# Patient Record
Sex: Female | Born: 1960 | Race: Black or African American | Hispanic: No | Smoking: Never smoker
Health system: Southern US, Community
[De-identification: ages and names within clinical notes are randomized; demographics above are authoritative.]

## PROBLEM LIST (undated history)

## (undated) DIAGNOSIS — R51 Headache: Secondary | ICD-10-CM

## (undated) DIAGNOSIS — E785 Hyperlipidemia, unspecified: Secondary | ICD-10-CM

## (undated) DIAGNOSIS — I10 Essential (primary) hypertension: Secondary | ICD-10-CM

## (undated) DIAGNOSIS — K219 Gastro-esophageal reflux disease without esophagitis: Secondary | ICD-10-CM

## (undated) DIAGNOSIS — I639 Cerebral infarction, unspecified: Secondary | ICD-10-CM

## (undated) DIAGNOSIS — G4730 Sleep apnea, unspecified: Secondary | ICD-10-CM

## (undated) DIAGNOSIS — N189 Chronic kidney disease, unspecified: Secondary | ICD-10-CM

## (undated) DIAGNOSIS — R519 Headache, unspecified: Secondary | ICD-10-CM

## (undated) DIAGNOSIS — G629 Polyneuropathy, unspecified: Secondary | ICD-10-CM

## (undated) DIAGNOSIS — G935 Compression of brain: Secondary | ICD-10-CM

## (undated) HISTORY — PX: BRAIN SURGERY: SHX531

## (undated) HISTORY — DX: Polyneuropathy, unspecified: G62.9

## (undated) HISTORY — DX: Cerebral infarction, unspecified: I63.9

## (undated) HISTORY — DX: Hyperlipidemia, unspecified: E78.5

## (undated) HISTORY — DX: Sleep apnea, unspecified: G47.30

## (undated) HISTORY — PX: REDUCTION MAMMAPLASTY: SUR839

## (undated) HISTORY — DX: Compression of brain: G93.5

## (undated) HISTORY — DX: Chronic kidney disease, unspecified: N18.9

## (undated) HISTORY — DX: Essential (primary) hypertension: I10

---

## 1997-10-18 ENCOUNTER — Other Ambulatory Visit: Admission: RE | Admit: 1997-10-18 | Discharge: 1997-10-18 | Payer: Self-pay | Admitting: Family Medicine

## 1997-11-26 ENCOUNTER — Emergency Department (HOSPITAL_COMMUNITY): Admission: EM | Admit: 1997-11-26 | Discharge: 1997-11-26 | Payer: Self-pay | Admitting: Emergency Medicine

## 1997-12-27 ENCOUNTER — Other Ambulatory Visit: Admission: RE | Admit: 1997-12-27 | Discharge: 1997-12-27 | Payer: Self-pay | Admitting: Specialist

## 1998-01-06 ENCOUNTER — Emergency Department (HOSPITAL_COMMUNITY): Admission: EM | Admit: 1998-01-06 | Discharge: 1998-01-06 | Payer: Self-pay | Admitting: Emergency Medicine

## 1998-02-20 ENCOUNTER — Emergency Department (HOSPITAL_COMMUNITY): Admission: EM | Admit: 1998-02-20 | Discharge: 1998-02-20 | Payer: Self-pay | Admitting: Emergency Medicine

## 1998-03-05 ENCOUNTER — Emergency Department (HOSPITAL_COMMUNITY): Admission: EM | Admit: 1998-03-05 | Discharge: 1998-03-05 | Payer: Self-pay | Admitting: Emergency Medicine

## 1998-03-18 ENCOUNTER — Ambulatory Visit (HOSPITAL_BASED_OUTPATIENT_CLINIC_OR_DEPARTMENT_OTHER): Admission: RE | Admit: 1998-03-18 | Discharge: 1998-03-18 | Payer: Self-pay | Admitting: Urology

## 1998-03-19 ENCOUNTER — Ambulatory Visit (HOSPITAL_COMMUNITY): Admission: RE | Admit: 1998-03-19 | Discharge: 1998-03-19 | Payer: Self-pay | Admitting: Orthopaedic Surgery

## 1998-04-09 ENCOUNTER — Encounter: Admission: RE | Admit: 1998-04-09 | Discharge: 1998-07-08 | Payer: Self-pay | Admitting: Orthopaedic Surgery

## 1998-04-29 ENCOUNTER — Encounter: Payer: Self-pay | Admitting: Emergency Medicine

## 1998-04-29 ENCOUNTER — Emergency Department (HOSPITAL_COMMUNITY): Admission: EM | Admit: 1998-04-29 | Discharge: 1998-04-29 | Payer: Self-pay | Admitting: Emergency Medicine

## 1998-07-09 ENCOUNTER — Emergency Department (HOSPITAL_COMMUNITY): Admission: EM | Admit: 1998-07-09 | Discharge: 1998-07-09 | Payer: Self-pay | Admitting: Emergency Medicine

## 1998-07-13 ENCOUNTER — Emergency Department (HOSPITAL_COMMUNITY): Admission: EM | Admit: 1998-07-13 | Discharge: 1998-07-13 | Payer: Self-pay | Admitting: Emergency Medicine

## 1998-07-16 ENCOUNTER — Encounter: Admission: RE | Admit: 1998-07-16 | Discharge: 1998-10-14 | Payer: Self-pay | Admitting: Orthopaedic Surgery

## 1998-07-17 ENCOUNTER — Ambulatory Visit (HOSPITAL_COMMUNITY): Admission: RE | Admit: 1998-07-17 | Discharge: 1998-07-17 | Payer: Self-pay | Admitting: Family Medicine

## 1998-07-17 ENCOUNTER — Encounter: Payer: Self-pay | Admitting: Family Medicine

## 1998-09-10 ENCOUNTER — Other Ambulatory Visit: Admission: RE | Admit: 1998-09-10 | Discharge: 1998-09-10 | Payer: Self-pay | Admitting: Specialist

## 1998-09-18 ENCOUNTER — Emergency Department (HOSPITAL_COMMUNITY): Admission: EM | Admit: 1998-09-18 | Discharge: 1998-09-18 | Payer: Self-pay | Admitting: *Deleted

## 1998-10-24 ENCOUNTER — Encounter: Payer: Self-pay | Admitting: *Deleted

## 1998-10-24 ENCOUNTER — Emergency Department (HOSPITAL_COMMUNITY): Admission: EM | Admit: 1998-10-24 | Discharge: 1998-10-24 | Payer: Self-pay | Admitting: *Deleted

## 1998-10-27 ENCOUNTER — Emergency Department (HOSPITAL_COMMUNITY): Admission: EM | Admit: 1998-10-27 | Discharge: 1998-10-27 | Payer: Self-pay | Admitting: Emergency Medicine

## 1998-11-14 ENCOUNTER — Encounter: Admission: RE | Admit: 1998-11-14 | Discharge: 1999-02-12 | Payer: Self-pay | Admitting: Family Medicine

## 1998-11-20 ENCOUNTER — Encounter: Admission: RE | Admit: 1998-11-20 | Discharge: 1999-02-18 | Payer: Self-pay

## 1999-03-07 HISTORY — PX: CRANIECTOMY SUBOCCIPITAL W/ CERVICAL LAMINECTOMY / CHIARI: SUR327

## 1999-05-06 ENCOUNTER — Encounter: Payer: Self-pay | Admitting: Emergency Medicine

## 1999-05-06 ENCOUNTER — Emergency Department (HOSPITAL_COMMUNITY): Admission: EM | Admit: 1999-05-06 | Discharge: 1999-05-06 | Payer: Self-pay

## 1999-06-11 ENCOUNTER — Encounter: Payer: Self-pay | Admitting: Cardiovascular Disease

## 1999-06-11 ENCOUNTER — Ambulatory Visit (HOSPITAL_COMMUNITY): Admission: RE | Admit: 1999-06-11 | Discharge: 1999-06-11 | Payer: Self-pay | Admitting: Cardiovascular Disease

## 1999-06-27 ENCOUNTER — Encounter: Payer: Self-pay | Admitting: Cardiovascular Disease

## 1999-06-27 ENCOUNTER — Ambulatory Visit (HOSPITAL_COMMUNITY): Admission: RE | Admit: 1999-06-27 | Discharge: 1999-06-27 | Payer: Self-pay | Admitting: Cardiovascular Disease

## 1999-07-22 ENCOUNTER — Ambulatory Visit (HOSPITAL_COMMUNITY): Admission: RE | Admit: 1999-07-22 | Discharge: 1999-07-22 | Payer: Self-pay | Admitting: Cardiovascular Disease

## 1999-09-04 ENCOUNTER — Other Ambulatory Visit: Admission: RE | Admit: 1999-09-04 | Discharge: 1999-09-04 | Payer: Self-pay | Admitting: Urology

## 1999-09-17 ENCOUNTER — Encounter: Payer: Self-pay | Admitting: Urology

## 1999-09-17 ENCOUNTER — Encounter: Admission: RE | Admit: 1999-09-17 | Discharge: 1999-09-17 | Payer: Self-pay | Admitting: Urology

## 1999-10-02 ENCOUNTER — Encounter (INDEPENDENT_AMBULATORY_CARE_PROVIDER_SITE_OTHER): Payer: Self-pay | Admitting: *Deleted

## 1999-10-02 ENCOUNTER — Ambulatory Visit (HOSPITAL_BASED_OUTPATIENT_CLINIC_OR_DEPARTMENT_OTHER): Admission: RE | Admit: 1999-10-02 | Discharge: 1999-10-02 | Payer: Self-pay | Admitting: Urology

## 1999-10-13 ENCOUNTER — Other Ambulatory Visit: Admission: RE | Admit: 1999-10-13 | Discharge: 1999-10-13 | Payer: Self-pay | Admitting: Family Medicine

## 1999-10-16 ENCOUNTER — Encounter: Payer: Self-pay | Admitting: Family Medicine

## 1999-10-16 ENCOUNTER — Ambulatory Visit (HOSPITAL_COMMUNITY): Admission: RE | Admit: 1999-10-16 | Discharge: 1999-10-16 | Payer: Self-pay | Admitting: Family Medicine

## 1999-10-24 ENCOUNTER — Ambulatory Visit (HOSPITAL_COMMUNITY): Admission: RE | Admit: 1999-10-24 | Discharge: 1999-10-24 | Payer: Self-pay | Admitting: Family Medicine

## 1999-12-27 ENCOUNTER — Encounter: Payer: Self-pay | Admitting: Emergency Medicine

## 1999-12-27 ENCOUNTER — Emergency Department (HOSPITAL_COMMUNITY): Admission: EM | Admit: 1999-12-27 | Discharge: 1999-12-27 | Payer: Self-pay | Admitting: Emergency Medicine

## 2000-01-15 ENCOUNTER — Ambulatory Visit (HOSPITAL_COMMUNITY): Admission: RE | Admit: 2000-01-15 | Discharge: 2000-01-15 | Payer: Self-pay | Admitting: *Deleted

## 2000-01-22 ENCOUNTER — Ambulatory Visit (HOSPITAL_COMMUNITY): Admission: RE | Admit: 2000-01-22 | Discharge: 2000-01-22 | Payer: Self-pay | Admitting: *Deleted

## 2000-01-24 ENCOUNTER — Emergency Department (HOSPITAL_COMMUNITY): Admission: EM | Admit: 2000-01-24 | Discharge: 2000-01-24 | Payer: Self-pay | Admitting: *Deleted

## 2000-01-24 ENCOUNTER — Encounter: Payer: Self-pay | Admitting: *Deleted

## 2000-01-30 ENCOUNTER — Ambulatory Visit (HOSPITAL_COMMUNITY): Admission: RE | Admit: 2000-01-30 | Discharge: 2000-01-30 | Payer: Self-pay | Admitting: *Deleted

## 2000-01-30 ENCOUNTER — Encounter: Payer: Self-pay | Admitting: *Deleted

## 2000-04-20 ENCOUNTER — Emergency Department (HOSPITAL_COMMUNITY): Admission: EM | Admit: 2000-04-20 | Discharge: 2000-04-20 | Payer: Self-pay | Admitting: Emergency Medicine

## 2000-04-20 ENCOUNTER — Encounter: Payer: Self-pay | Admitting: Emergency Medicine

## 2000-05-18 ENCOUNTER — Encounter: Payer: Self-pay | Admitting: Specialist

## 2000-05-18 ENCOUNTER — Encounter: Admission: RE | Admit: 2000-05-18 | Discharge: 2000-05-18 | Payer: Self-pay | Admitting: Specialist

## 2000-06-12 ENCOUNTER — Encounter: Payer: Self-pay | Admitting: Emergency Medicine

## 2000-06-12 ENCOUNTER — Emergency Department (HOSPITAL_COMMUNITY): Admission: EM | Admit: 2000-06-12 | Discharge: 2000-06-13 | Payer: Self-pay | Admitting: Emergency Medicine

## 2000-07-25 ENCOUNTER — Emergency Department (HOSPITAL_COMMUNITY): Admission: EM | Admit: 2000-07-25 | Discharge: 2000-07-25 | Payer: Self-pay | Admitting: Emergency Medicine

## 2000-07-25 ENCOUNTER — Encounter: Payer: Self-pay | Admitting: Emergency Medicine

## 2000-08-01 ENCOUNTER — Encounter: Payer: Self-pay | Admitting: Family Medicine

## 2000-08-01 ENCOUNTER — Ambulatory Visit (HOSPITAL_COMMUNITY): Admission: RE | Admit: 2000-08-01 | Discharge: 2000-08-01 | Payer: Self-pay | Admitting: Family Medicine

## 2000-10-05 ENCOUNTER — Inpatient Hospital Stay (HOSPITAL_COMMUNITY): Admission: EM | Admit: 2000-10-05 | Discharge: 2000-10-07 | Payer: Self-pay | Admitting: Emergency Medicine

## 2000-10-05 ENCOUNTER — Encounter: Payer: Self-pay | Admitting: Emergency Medicine

## 2000-10-06 ENCOUNTER — Encounter: Payer: Self-pay | Admitting: Cardiovascular Disease

## 2000-10-07 ENCOUNTER — Encounter: Payer: Self-pay | Admitting: Cardiovascular Disease

## 2000-10-21 ENCOUNTER — Emergency Department (HOSPITAL_COMMUNITY): Admission: EM | Admit: 2000-10-21 | Discharge: 2000-10-21 | Payer: Self-pay | Admitting: Emergency Medicine

## 2000-10-29 ENCOUNTER — Ambulatory Visit (HOSPITAL_COMMUNITY): Admission: RE | Admit: 2000-10-29 | Discharge: 2000-10-29 | Payer: Self-pay | Admitting: Cardiovascular Disease

## 2000-12-05 ENCOUNTER — Encounter: Payer: Self-pay | Admitting: Family Medicine

## 2000-12-05 ENCOUNTER — Ambulatory Visit (HOSPITAL_COMMUNITY): Admission: RE | Admit: 2000-12-05 | Discharge: 2000-12-05 | Payer: Self-pay | Admitting: Family Medicine

## 2000-12-18 ENCOUNTER — Emergency Department (HOSPITAL_COMMUNITY): Admission: EM | Admit: 2000-12-18 | Discharge: 2000-12-18 | Payer: Self-pay | Admitting: Emergency Medicine

## 2000-12-18 ENCOUNTER — Encounter: Payer: Self-pay | Admitting: Emergency Medicine

## 2001-01-28 ENCOUNTER — Ambulatory Visit (HOSPITAL_COMMUNITY): Admission: RE | Admit: 2001-01-28 | Discharge: 2001-01-28 | Payer: Self-pay | Admitting: Neurosurgery

## 2001-01-28 ENCOUNTER — Encounter: Payer: Self-pay | Admitting: Neurosurgery

## 2001-03-03 ENCOUNTER — Ambulatory Visit (HOSPITAL_COMMUNITY): Admission: RE | Admit: 2001-03-03 | Discharge: 2001-03-03 | Payer: Self-pay | Admitting: Neurosurgery

## 2001-03-03 ENCOUNTER — Encounter: Payer: Self-pay | Admitting: Neurosurgery

## 2001-04-13 ENCOUNTER — Inpatient Hospital Stay (HOSPITAL_COMMUNITY): Admission: RE | Admit: 2001-04-13 | Discharge: 2001-04-20 | Payer: Self-pay | Admitting: Neurosurgery

## 2001-04-24 ENCOUNTER — Inpatient Hospital Stay (HOSPITAL_COMMUNITY): Admission: EM | Admit: 2001-04-24 | Discharge: 2001-04-26 | Payer: Self-pay | Admitting: *Deleted

## 2001-04-24 ENCOUNTER — Encounter: Payer: Self-pay | Admitting: Neurosurgery

## 2001-05-27 ENCOUNTER — Encounter: Payer: Self-pay | Admitting: Neurosurgery

## 2001-05-28 ENCOUNTER — Inpatient Hospital Stay (HOSPITAL_COMMUNITY): Admission: RE | Admit: 2001-05-28 | Discharge: 2001-06-01 | Payer: Self-pay | Admitting: Neurosurgery

## 2001-06-06 ENCOUNTER — Inpatient Hospital Stay (HOSPITAL_COMMUNITY): Admission: AD | Admit: 2001-06-06 | Discharge: 2001-06-08 | Payer: Self-pay | Admitting: Neurosurgery

## 2001-06-13 ENCOUNTER — Emergency Department (HOSPITAL_COMMUNITY): Admission: EM | Admit: 2001-06-13 | Discharge: 2001-06-13 | Payer: Self-pay | Admitting: Emergency Medicine

## 2001-06-14 ENCOUNTER — Encounter: Payer: Self-pay | Admitting: Emergency Medicine

## 2001-06-24 ENCOUNTER — Emergency Department (HOSPITAL_COMMUNITY): Admission: EM | Admit: 2001-06-24 | Discharge: 2001-06-24 | Payer: Self-pay | Admitting: Emergency Medicine

## 2001-06-24 ENCOUNTER — Encounter: Payer: Self-pay | Admitting: Emergency Medicine

## 2001-09-03 ENCOUNTER — Emergency Department (HOSPITAL_COMMUNITY): Admission: EM | Admit: 2001-09-03 | Discharge: 2001-09-03 | Payer: Self-pay | Admitting: Emergency Medicine

## 2001-10-10 ENCOUNTER — Emergency Department (HOSPITAL_COMMUNITY): Admission: EM | Admit: 2001-10-10 | Discharge: 2001-10-10 | Payer: Self-pay | Admitting: Emergency Medicine

## 2001-10-19 ENCOUNTER — Encounter: Admission: RE | Admit: 2001-10-19 | Discharge: 2001-11-07 | Payer: Self-pay | Admitting: Neurosurgery

## 2001-10-28 ENCOUNTER — Emergency Department (HOSPITAL_COMMUNITY): Admission: EM | Admit: 2001-10-28 | Discharge: 2001-10-28 | Payer: Self-pay | Admitting: Emergency Medicine

## 2001-10-28 ENCOUNTER — Encounter: Payer: Self-pay | Admitting: Emergency Medicine

## 2002-01-06 ENCOUNTER — Emergency Department (HOSPITAL_COMMUNITY): Admission: EM | Admit: 2002-01-06 | Discharge: 2002-01-06 | Payer: Self-pay | Admitting: Emergency Medicine

## 2002-02-22 ENCOUNTER — Emergency Department (HOSPITAL_COMMUNITY): Admission: EM | Admit: 2002-02-22 | Discharge: 2002-02-23 | Payer: Self-pay | Admitting: Emergency Medicine

## 2002-02-22 ENCOUNTER — Encounter: Payer: Self-pay | Admitting: Emergency Medicine

## 2002-02-23 ENCOUNTER — Encounter: Payer: Self-pay | Admitting: Emergency Medicine

## 2002-02-24 ENCOUNTER — Encounter: Payer: Self-pay | Admitting: Emergency Medicine

## 2002-02-24 ENCOUNTER — Emergency Department (HOSPITAL_COMMUNITY): Admission: EM | Admit: 2002-02-24 | Discharge: 2002-02-24 | Payer: Self-pay | Admitting: Emergency Medicine

## 2002-05-06 ENCOUNTER — Emergency Department (HOSPITAL_COMMUNITY): Admission: EM | Admit: 2002-05-06 | Discharge: 2002-05-07 | Payer: Self-pay | Admitting: Emergency Medicine

## 2002-05-07 ENCOUNTER — Encounter: Payer: Self-pay | Admitting: Emergency Medicine

## 2002-07-23 ENCOUNTER — Emergency Department (HOSPITAL_COMMUNITY): Admission: EM | Admit: 2002-07-23 | Discharge: 2002-07-23 | Payer: Self-pay | Admitting: Emergency Medicine

## 2002-07-25 ENCOUNTER — Encounter: Payer: Self-pay | Admitting: Emergency Medicine

## 2002-07-25 ENCOUNTER — Emergency Department (HOSPITAL_COMMUNITY): Admission: EM | Admit: 2002-07-25 | Discharge: 2002-07-26 | Payer: Self-pay | Admitting: Emergency Medicine

## 2002-08-24 ENCOUNTER — Emergency Department (HOSPITAL_COMMUNITY): Admission: EM | Admit: 2002-08-24 | Discharge: 2002-08-24 | Payer: Self-pay | Admitting: Emergency Medicine

## 2002-09-17 ENCOUNTER — Emergency Department (HOSPITAL_COMMUNITY): Admission: EM | Admit: 2002-09-17 | Discharge: 2002-09-17 | Payer: Self-pay

## 2002-10-28 ENCOUNTER — Encounter: Payer: Self-pay | Admitting: Emergency Medicine

## 2002-10-28 ENCOUNTER — Emergency Department (HOSPITAL_COMMUNITY): Admission: EM | Admit: 2002-10-28 | Discharge: 2002-10-28 | Payer: Self-pay | Admitting: Emergency Medicine

## 2002-12-27 ENCOUNTER — Emergency Department (HOSPITAL_COMMUNITY): Admission: EM | Admit: 2002-12-27 | Discharge: 2002-12-27 | Payer: Self-pay | Admitting: Emergency Medicine

## 2003-02-19 ENCOUNTER — Emergency Department (HOSPITAL_COMMUNITY): Admission: EM | Admit: 2003-02-19 | Discharge: 2003-02-19 | Payer: Self-pay | Admitting: Emergency Medicine

## 2003-05-20 ENCOUNTER — Emergency Department (HOSPITAL_COMMUNITY): Admission: EM | Admit: 2003-05-20 | Discharge: 2003-05-20 | Payer: Self-pay | Admitting: Emergency Medicine

## 2003-08-11 ENCOUNTER — Emergency Department (HOSPITAL_COMMUNITY): Admission: EM | Admit: 2003-08-11 | Discharge: 2003-08-11 | Payer: Self-pay | Admitting: Emergency Medicine

## 2004-01-10 ENCOUNTER — Emergency Department (HOSPITAL_COMMUNITY): Admission: EM | Admit: 2004-01-10 | Discharge: 2004-01-10 | Payer: Self-pay | Admitting: Emergency Medicine

## 2004-03-20 ENCOUNTER — Emergency Department (HOSPITAL_COMMUNITY): Admission: EM | Admit: 2004-03-20 | Discharge: 2004-03-21 | Payer: Self-pay | Admitting: Emergency Medicine

## 2004-04-08 ENCOUNTER — Emergency Department (HOSPITAL_COMMUNITY): Admission: EM | Admit: 2004-04-08 | Discharge: 2004-04-09 | Payer: Self-pay | Admitting: Emergency Medicine

## 2004-08-16 ENCOUNTER — Emergency Department (HOSPITAL_COMMUNITY): Admission: EM | Admit: 2004-08-16 | Discharge: 2004-08-16 | Payer: Self-pay | Admitting: Emergency Medicine

## 2004-08-20 ENCOUNTER — Emergency Department (HOSPITAL_COMMUNITY): Admission: EM | Admit: 2004-08-20 | Discharge: 2004-08-21 | Payer: Self-pay | Admitting: Emergency Medicine

## 2004-09-29 ENCOUNTER — Emergency Department (HOSPITAL_COMMUNITY): Admission: EM | Admit: 2004-09-29 | Discharge: 2004-09-29 | Payer: Self-pay | Admitting: Emergency Medicine

## 2005-01-06 ENCOUNTER — Emergency Department (HOSPITAL_COMMUNITY): Admission: EM | Admit: 2005-01-06 | Discharge: 2005-01-06 | Payer: Self-pay | Admitting: Emergency Medicine

## 2005-01-08 ENCOUNTER — Emergency Department (HOSPITAL_COMMUNITY): Admission: EM | Admit: 2005-01-08 | Discharge: 2005-01-08 | Payer: Self-pay | Admitting: Emergency Medicine

## 2005-02-11 ENCOUNTER — Emergency Department (HOSPITAL_COMMUNITY): Admission: EM | Admit: 2005-02-11 | Discharge: 2005-02-11 | Payer: Self-pay | Admitting: Emergency Medicine

## 2005-03-22 ENCOUNTER — Emergency Department (HOSPITAL_COMMUNITY): Admission: EM | Admit: 2005-03-22 | Discharge: 2005-03-22 | Payer: Self-pay | Admitting: Emergency Medicine

## 2005-07-22 ENCOUNTER — Inpatient Hospital Stay (HOSPITAL_COMMUNITY): Admission: EM | Admit: 2005-07-22 | Discharge: 2005-07-24 | Payer: Self-pay | Admitting: Emergency Medicine

## 2005-07-28 ENCOUNTER — Emergency Department (HOSPITAL_COMMUNITY): Admission: EM | Admit: 2005-07-28 | Discharge: 2005-07-28 | Payer: Self-pay | Admitting: Emergency Medicine

## 2005-09-20 ENCOUNTER — Emergency Department (HOSPITAL_COMMUNITY): Admission: EM | Admit: 2005-09-20 | Discharge: 2005-09-20 | Payer: Self-pay | Admitting: Emergency Medicine

## 2005-09-25 ENCOUNTER — Emergency Department (HOSPITAL_COMMUNITY): Admission: EM | Admit: 2005-09-25 | Discharge: 2005-09-25 | Payer: Self-pay | Admitting: Emergency Medicine

## 2006-08-18 ENCOUNTER — Emergency Department (HOSPITAL_COMMUNITY): Admission: EM | Admit: 2006-08-18 | Discharge: 2006-08-18 | Payer: Self-pay | Admitting: Emergency Medicine

## 2006-09-24 ENCOUNTER — Emergency Department (HOSPITAL_COMMUNITY): Admission: EM | Admit: 2006-09-24 | Discharge: 2006-09-24 | Payer: Self-pay | Admitting: Emergency Medicine

## 2007-02-03 ENCOUNTER — Inpatient Hospital Stay (HOSPITAL_COMMUNITY): Admission: EM | Admit: 2007-02-03 | Discharge: 2007-02-05 | Payer: Self-pay | Admitting: Emergency Medicine

## 2007-02-04 ENCOUNTER — Ambulatory Visit: Payer: Self-pay | Admitting: Cardiology

## 2007-02-04 ENCOUNTER — Encounter (INDEPENDENT_AMBULATORY_CARE_PROVIDER_SITE_OTHER): Payer: Self-pay | Admitting: Neurology

## 2007-02-11 ENCOUNTER — Inpatient Hospital Stay (HOSPITAL_COMMUNITY): Admission: EM | Admit: 2007-02-11 | Discharge: 2007-02-13 | Payer: Self-pay | Admitting: Emergency Medicine

## 2007-02-25 ENCOUNTER — Emergency Department (HOSPITAL_COMMUNITY): Admission: EM | Admit: 2007-02-25 | Discharge: 2007-02-25 | Payer: Self-pay | Admitting: Emergency Medicine

## 2007-03-11 ENCOUNTER — Observation Stay (HOSPITAL_COMMUNITY): Admission: EM | Admit: 2007-03-11 | Discharge: 2007-03-12 | Payer: Self-pay | Admitting: Emergency Medicine

## 2007-03-16 ENCOUNTER — Emergency Department (HOSPITAL_COMMUNITY): Admission: EM | Admit: 2007-03-16 | Discharge: 2007-03-16 | Payer: Self-pay | Admitting: Emergency Medicine

## 2007-04-08 ENCOUNTER — Emergency Department (HOSPITAL_COMMUNITY): Admission: EM | Admit: 2007-04-08 | Discharge: 2007-04-09 | Payer: Self-pay | Admitting: Emergency Medicine

## 2007-05-23 ENCOUNTER — Emergency Department (HOSPITAL_COMMUNITY): Admission: EM | Admit: 2007-05-23 | Discharge: 2007-05-23 | Payer: Self-pay | Admitting: Emergency Medicine

## 2007-07-01 ENCOUNTER — Inpatient Hospital Stay (HOSPITAL_COMMUNITY): Admission: EM | Admit: 2007-07-01 | Discharge: 2007-07-04 | Payer: Self-pay | Admitting: Emergency Medicine

## 2007-07-04 ENCOUNTER — Encounter (INDEPENDENT_AMBULATORY_CARE_PROVIDER_SITE_OTHER): Payer: Self-pay | Admitting: Cardiology

## 2007-07-21 ENCOUNTER — Emergency Department (HOSPITAL_COMMUNITY): Admission: EM | Admit: 2007-07-21 | Discharge: 2007-07-21 | Payer: Self-pay | Admitting: Emergency Medicine

## 2007-08-17 ENCOUNTER — Emergency Department (HOSPITAL_COMMUNITY): Admission: EM | Admit: 2007-08-17 | Discharge: 2007-08-18 | Payer: Self-pay | Admitting: Emergency Medicine

## 2007-10-18 ENCOUNTER — Emergency Department (HOSPITAL_COMMUNITY): Admission: EM | Admit: 2007-10-18 | Discharge: 2007-10-18 | Payer: Self-pay | Admitting: Emergency Medicine

## 2007-10-19 ENCOUNTER — Emergency Department (HOSPITAL_COMMUNITY): Admission: EM | Admit: 2007-10-19 | Discharge: 2007-10-19 | Payer: Self-pay | Admitting: Emergency Medicine

## 2007-10-30 ENCOUNTER — Emergency Department (HOSPITAL_COMMUNITY): Admission: EM | Admit: 2007-10-30 | Discharge: 2007-10-30 | Payer: Self-pay | Admitting: Emergency Medicine

## 2007-11-10 ENCOUNTER — Emergency Department (HOSPITAL_COMMUNITY): Admission: EM | Admit: 2007-11-10 | Discharge: 2007-11-10 | Payer: Self-pay | Admitting: Emergency Medicine

## 2007-12-04 ENCOUNTER — Emergency Department (HOSPITAL_COMMUNITY): Admission: EM | Admit: 2007-12-04 | Discharge: 2007-12-04 | Payer: Self-pay | Admitting: Emergency Medicine

## 2008-08-15 ENCOUNTER — Emergency Department (HOSPITAL_COMMUNITY): Admission: EM | Admit: 2008-08-15 | Discharge: 2008-08-15 | Payer: Self-pay | Admitting: Emergency Medicine

## 2008-09-03 ENCOUNTER — Emergency Department (HOSPITAL_COMMUNITY): Admission: EM | Admit: 2008-09-03 | Discharge: 2008-09-04 | Payer: Self-pay | Admitting: Emergency Medicine

## 2008-11-23 ENCOUNTER — Emergency Department (HOSPITAL_COMMUNITY): Admission: EM | Admit: 2008-11-23 | Discharge: 2008-11-23 | Payer: Self-pay | Admitting: Emergency Medicine

## 2009-01-05 ENCOUNTER — Emergency Department (HOSPITAL_COMMUNITY): Admission: EM | Admit: 2009-01-05 | Discharge: 2009-01-06 | Payer: Self-pay | Admitting: Emergency Medicine

## 2009-01-22 ENCOUNTER — Emergency Department (HOSPITAL_COMMUNITY): Admission: EM | Admit: 2009-01-22 | Discharge: 2009-01-22 | Payer: Self-pay | Admitting: Emergency Medicine

## 2009-03-14 ENCOUNTER — Emergency Department (HOSPITAL_COMMUNITY): Admission: EM | Admit: 2009-03-14 | Discharge: 2009-03-14 | Payer: Self-pay | Admitting: Emergency Medicine

## 2009-04-30 ENCOUNTER — Emergency Department (HOSPITAL_COMMUNITY): Admission: EM | Admit: 2009-04-30 | Discharge: 2009-04-30 | Payer: Self-pay | Admitting: Emergency Medicine

## 2009-06-26 ENCOUNTER — Emergency Department (HOSPITAL_COMMUNITY): Admission: EM | Admit: 2009-06-26 | Discharge: 2009-06-27 | Payer: Self-pay | Admitting: Emergency Medicine

## 2009-07-12 ENCOUNTER — Inpatient Hospital Stay (HOSPITAL_COMMUNITY): Admission: EM | Admit: 2009-07-12 | Discharge: 2009-07-15 | Payer: Self-pay | Admitting: Emergency Medicine

## 2009-07-13 ENCOUNTER — Encounter (INDEPENDENT_AMBULATORY_CARE_PROVIDER_SITE_OTHER): Payer: Self-pay | Admitting: Internal Medicine

## 2009-07-15 ENCOUNTER — Encounter (INDEPENDENT_AMBULATORY_CARE_PROVIDER_SITE_OTHER): Payer: Self-pay | Admitting: Internal Medicine

## 2009-07-25 ENCOUNTER — Emergency Department (HOSPITAL_COMMUNITY): Admission: EM | Admit: 2009-07-25 | Discharge: 2009-07-25 | Payer: Self-pay | Admitting: Emergency Medicine

## 2009-08-01 ENCOUNTER — Emergency Department (HOSPITAL_COMMUNITY): Admission: EM | Admit: 2009-08-01 | Discharge: 2009-08-01 | Payer: Self-pay | Admitting: Emergency Medicine

## 2009-09-01 ENCOUNTER — Emergency Department (HOSPITAL_COMMUNITY): Admission: EM | Admit: 2009-09-01 | Discharge: 2009-09-02 | Payer: Self-pay | Admitting: Emergency Medicine

## 2009-09-30 ENCOUNTER — Ambulatory Visit: Payer: Self-pay | Admitting: Family Medicine

## 2009-09-30 ENCOUNTER — Inpatient Hospital Stay (HOSPITAL_COMMUNITY): Admission: EM | Admit: 2009-09-30 | Discharge: 2009-10-04 | Payer: Self-pay | Admitting: Emergency Medicine

## 2009-10-01 ENCOUNTER — Encounter: Payer: Self-pay | Admitting: Family Medicine

## 2009-10-10 ENCOUNTER — Ambulatory Visit: Payer: Self-pay | Admitting: Family Medicine

## 2009-10-10 ENCOUNTER — Observation Stay (HOSPITAL_COMMUNITY): Admission: EM | Admit: 2009-10-10 | Discharge: 2009-10-11 | Payer: Self-pay | Admitting: Emergency Medicine

## 2009-10-14 ENCOUNTER — Encounter: Payer: Self-pay | Admitting: *Deleted

## 2009-10-17 ENCOUNTER — Observation Stay (HOSPITAL_COMMUNITY): Admission: EM | Admit: 2009-10-17 | Discharge: 2009-10-17 | Payer: Self-pay | Admitting: Emergency Medicine

## 2009-10-18 ENCOUNTER — Ambulatory Visit: Payer: Self-pay | Admitting: Family Medicine

## 2009-10-18 DIAGNOSIS — I6350 Cerebral infarction due to unspecified occlusion or stenosis of unspecified cerebral artery: Secondary | ICD-10-CM | POA: Insufficient documentation

## 2009-10-18 DIAGNOSIS — Z91041 Radiographic dye allergy status: Secondary | ICD-10-CM

## 2009-10-18 DIAGNOSIS — E785 Hyperlipidemia, unspecified: Secondary | ICD-10-CM

## 2009-10-18 DIAGNOSIS — Q054 Unspecified spina bifida with hydrocephalus: Secondary | ICD-10-CM | POA: Insufficient documentation

## 2009-10-18 DIAGNOSIS — K319 Disease of stomach and duodenum, unspecified: Secondary | ICD-10-CM | POA: Insufficient documentation

## 2009-10-18 DIAGNOSIS — I10 Essential (primary) hypertension: Secondary | ICD-10-CM

## 2009-11-05 ENCOUNTER — Ambulatory Visit: Payer: Self-pay | Admitting: Family Medicine

## 2009-11-05 DIAGNOSIS — R42 Dizziness and giddiness: Secondary | ICD-10-CM | POA: Insufficient documentation

## 2009-11-21 ENCOUNTER — Observation Stay (HOSPITAL_COMMUNITY): Admission: EM | Admit: 2009-11-21 | Discharge: 2009-11-24 | Payer: Self-pay | Admitting: Emergency Medicine

## 2009-11-21 ENCOUNTER — Ambulatory Visit: Payer: Self-pay | Admitting: Family Medicine

## 2009-11-21 ENCOUNTER — Encounter: Payer: Self-pay | Admitting: Family Medicine

## 2009-11-21 DIAGNOSIS — R079 Chest pain, unspecified: Secondary | ICD-10-CM

## 2009-12-06 ENCOUNTER — Ambulatory Visit: Payer: Self-pay | Admitting: Family Medicine

## 2009-12-06 ENCOUNTER — Encounter: Payer: Self-pay | Admitting: Family Medicine

## 2009-12-20 ENCOUNTER — Ambulatory Visit: Payer: Self-pay | Admitting: Family Medicine

## 2010-02-01 ENCOUNTER — Emergency Department (HOSPITAL_COMMUNITY): Admission: EM | Admit: 2010-02-01 | Discharge: 2010-02-01 | Payer: Self-pay | Admitting: Family Medicine

## 2010-02-01 ENCOUNTER — Emergency Department (HOSPITAL_COMMUNITY): Admission: EM | Admit: 2010-02-01 | Discharge: 2010-02-01 | Payer: Self-pay | Admitting: Emergency Medicine

## 2010-02-03 ENCOUNTER — Telehealth: Payer: Self-pay | Admitting: *Deleted

## 2010-02-04 ENCOUNTER — Ambulatory Visit: Payer: Self-pay | Admitting: Family Medicine

## 2010-02-04 DIAGNOSIS — R1110 Vomiting, unspecified: Secondary | ICD-10-CM

## 2010-02-11 ENCOUNTER — Encounter: Payer: Self-pay | Admitting: Family Medicine

## 2010-02-17 ENCOUNTER — Encounter: Payer: Self-pay | Admitting: Family Medicine

## 2010-02-17 ENCOUNTER — Inpatient Hospital Stay (HOSPITAL_COMMUNITY): Admission: EM | Admit: 2010-02-17 | Discharge: 2010-02-18 | Payer: Self-pay | Admitting: Emergency Medicine

## 2010-02-17 ENCOUNTER — Telehealth: Payer: Self-pay | Admitting: Family Medicine

## 2010-02-25 ENCOUNTER — Ambulatory Visit: Payer: Self-pay | Admitting: Family Medicine

## 2010-02-25 DIAGNOSIS — I808 Phlebitis and thrombophlebitis of other sites: Secondary | ICD-10-CM | POA: Insufficient documentation

## 2010-03-12 ENCOUNTER — Encounter: Payer: Self-pay | Admitting: Family Medicine

## 2010-03-12 ENCOUNTER — Ambulatory Visit: Payer: Self-pay | Admitting: Family Medicine

## 2010-03-13 ENCOUNTER — Telehealth: Payer: Self-pay | Admitting: Family Medicine

## 2010-03-13 ENCOUNTER — Ambulatory Visit: Payer: Self-pay | Admitting: Family Medicine

## 2010-03-13 DIAGNOSIS — R51 Headache: Secondary | ICD-10-CM

## 2010-03-13 DIAGNOSIS — R519 Headache, unspecified: Secondary | ICD-10-CM | POA: Insufficient documentation

## 2010-03-19 ENCOUNTER — Telehealth: Payer: Self-pay | Admitting: Family Medicine

## 2010-03-20 ENCOUNTER — Ambulatory Visit: Payer: Self-pay | Admitting: Family Medicine

## 2010-03-20 DIAGNOSIS — R0609 Other forms of dyspnea: Secondary | ICD-10-CM

## 2010-03-20 DIAGNOSIS — R0989 Other specified symptoms and signs involving the circulatory and respiratory systems: Secondary | ICD-10-CM

## 2010-04-17 ENCOUNTER — Ambulatory Visit (HOSPITAL_BASED_OUTPATIENT_CLINIC_OR_DEPARTMENT_OTHER): Admission: RE | Admit: 2010-04-17 | Discharge: 2010-04-17 | Payer: Self-pay | Admitting: Family Medicine

## 2010-04-19 ENCOUNTER — Ambulatory Visit: Payer: Self-pay | Admitting: Internal Medicine

## 2010-05-01 ENCOUNTER — Emergency Department (HOSPITAL_COMMUNITY): Admission: EM | Admit: 2010-05-01 | Discharge: 2010-05-01 | Payer: Self-pay | Admitting: Emergency Medicine

## 2010-05-06 ENCOUNTER — Ambulatory Visit: Payer: Self-pay | Admitting: Family Medicine

## 2010-05-06 DIAGNOSIS — G4730 Sleep apnea, unspecified: Secondary | ICD-10-CM | POA: Insufficient documentation

## 2010-05-14 ENCOUNTER — Ambulatory Visit: Payer: Self-pay | Admitting: Family Medicine

## 2010-05-14 ENCOUNTER — Telehealth (INDEPENDENT_AMBULATORY_CARE_PROVIDER_SITE_OTHER): Payer: Self-pay | Admitting: *Deleted

## 2010-05-14 ENCOUNTER — Ambulatory Visit (HOSPITAL_COMMUNITY): Admission: RE | Admit: 2010-05-14 | Discharge: 2010-05-14 | Payer: Self-pay | Admitting: Family Medicine

## 2010-05-14 ENCOUNTER — Encounter: Payer: Self-pay | Admitting: Family Medicine

## 2010-05-15 ENCOUNTER — Encounter: Admission: RE | Admit: 2010-05-15 | Discharge: 2010-05-15 | Payer: Self-pay | Admitting: Family Medicine

## 2010-06-05 ENCOUNTER — Encounter: Payer: Self-pay | Admitting: Family Medicine

## 2010-06-05 ENCOUNTER — Ambulatory Visit: Payer: Self-pay | Admitting: Family Medicine

## 2010-06-05 DIAGNOSIS — R197 Diarrhea, unspecified: Secondary | ICD-10-CM

## 2010-06-05 LAB — CONVERTED CEMR LAB
Chloride: 105 meq/L (ref 96–112)
Potassium: 4 meq/L (ref 3.5–5.3)
Sodium: 140 meq/L (ref 135–145)

## 2010-07-17 ENCOUNTER — Telehealth: Payer: Self-pay | Admitting: Family Medicine

## 2010-07-21 ENCOUNTER — Ambulatory Visit: Admission: RE | Admit: 2010-07-21 | Discharge: 2010-07-21 | Payer: Self-pay | Source: Home / Self Care

## 2010-07-21 ENCOUNTER — Emergency Department (HOSPITAL_COMMUNITY)
Admission: EM | Admit: 2010-07-21 | Discharge: 2010-07-21 | Payer: Self-pay | Source: Home / Self Care | Admitting: Emergency Medicine

## 2010-07-21 ENCOUNTER — Encounter: Payer: Self-pay | Admitting: Family Medicine

## 2010-07-21 ENCOUNTER — Ambulatory Visit (HOSPITAL_COMMUNITY)
Admission: RE | Admit: 2010-07-21 | Discharge: 2010-07-21 | Payer: Self-pay | Source: Home / Self Care | Admitting: Family Medicine

## 2010-07-21 ENCOUNTER — Other Ambulatory Visit: Payer: Self-pay

## 2010-07-22 ENCOUNTER — Telehealth (INDEPENDENT_AMBULATORY_CARE_PROVIDER_SITE_OTHER): Payer: Self-pay | Admitting: *Deleted

## 2010-07-23 ENCOUNTER — Ambulatory Visit
Admission: RE | Admit: 2010-07-23 | Discharge: 2010-07-23 | Payer: Self-pay | Source: Home / Self Care | Attending: Family Medicine | Admitting: Family Medicine

## 2010-07-23 DIAGNOSIS — IMO0002 Reserved for concepts with insufficient information to code with codable children: Secondary | ICD-10-CM | POA: Insufficient documentation

## 2010-07-23 LAB — CBC
HCT: 32.9 % — ABNORMAL LOW (ref 36.0–46.0)
Hemoglobin: 11 g/dL — ABNORMAL LOW (ref 12.0–15.0)
MCH: 25.5 pg — ABNORMAL LOW (ref 26.0–34.0)
MCHC: 33.4 g/dL (ref 30.0–36.0)
MCV: 76.2 fL — ABNORMAL LOW (ref 78.0–100.0)
Platelets: 359 10*3/uL (ref 150–400)
RBC: 4.32 MIL/uL (ref 3.87–5.11)
RDW: 16.1 % — ABNORMAL HIGH (ref 11.5–15.5)
WBC: 7.8 10*3/uL (ref 4.0–10.5)

## 2010-07-23 LAB — BASIC METABOLIC PANEL
BUN: 8 mg/dL (ref 6–23)
CO2: 26 mEq/L (ref 19–32)
Calcium: 9.3 mg/dL (ref 8.4–10.5)
Chloride: 107 mEq/L (ref 96–112)
Creatinine, Ser: 1.24 mg/dL — ABNORMAL HIGH (ref 0.4–1.2)
GFR calc Af Amer: 56 mL/min — ABNORMAL LOW (ref >60)
GFR calc non Af Amer: 46 mL/min — ABNORMAL LOW (ref >60)
Glucose, Bld: 97 mg/dL (ref 70–99)
Potassium: 3.7 mEq/L (ref 3.5–5.1)
Sodium: 141 mEq/L (ref 135–145)

## 2010-07-23 LAB — DIFFERENTIAL
Basophils Absolute: 0 10*3/uL (ref 0.0–0.1)
Basophils Relative: 0 % (ref 0–1)
Eosinophils Absolute: 0.5 10*3/uL (ref 0.0–0.7)
Eosinophils Relative: 7 % — ABNORMAL HIGH (ref 0–5)
Lymphocytes Relative: 53 % — ABNORMAL HIGH (ref 12–46)
Lymphs Abs: 4.1 10*3/uL — ABNORMAL HIGH (ref 0.7–4.0)
Monocytes Absolute: 0.4 10*3/uL (ref 0.1–1.0)
Monocytes Relative: 5 % (ref 3–12)
Neutro Abs: 2.7 10*3/uL (ref 1.7–7.7)
Neutrophils Relative %: 35 % — ABNORMAL LOW (ref 43–77)

## 2010-07-23 LAB — URINALYSIS, ROUTINE W REFLEX MICROSCOPIC
Ketones, ur: NEGATIVE mg/dL
Leukocytes, UA: NEGATIVE
Nitrite: NEGATIVE
Protein, ur: 100 mg/dL — AB
Specific Gravity, Urine: 1.017 (ref 1.005–1.030)
Urine Glucose, Fasting: NEGATIVE mg/dL
Urobilinogen, UA: 0.2 mg/dL (ref 0.0–1.0)
pH: 6 (ref 5.0–8.0)

## 2010-07-23 LAB — URINE MICROSCOPIC-ADD ON

## 2010-07-23 LAB — CK TOTAL AND CKMB (NOT AT ARMC)
CK, MB: 1 ng/mL (ref 0.3–4.0)
Relative Index: INVALID (ref 0.0–2.5)
Total CK: 80 U/L (ref 7–177)

## 2010-07-23 LAB — TROPONIN I: Troponin I: 0.01 ng/mL (ref 0.00–0.06)

## 2010-07-28 LAB — URINE CULTURE
Colony Count: 40000
Culture  Setup Time: 201201170439
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
## 2010-08-07 NOTE — Assessment & Plan Note (Signed)
Summary: F/U/KH   Vital Signs:  Patient profile:   50 year old female Weight:      227.4 pounds Temp:     98.2 degrees F oral Pulse rate:   93 / minute BP sitting:   159 / 100  (left arm) Cuff size:   regular  Vitals Entered By: Garen Grams LPN (Nov 05, 1608 2:49 PM) CC: f/u nausea Is Patient Diabetic? No Pain Assessment Patient in pain? no        Primary Care Provider:  Alvia Grove DO  CC:  f/u nausea.  History of Present Illness: Pt has stopped taking any meds except atenolol, phenergan, reglan.  She only takes reglan two times a day because she is only eating one meal/day.  Pt still having nausea though feels much improved.    nausea- occasionally feels dizzy with this.  feels like she is walking on air, has to hold on to wall or railing.  no ear ringing.  no Family hx of dizziness.  Has been going on for 3  months.  falls- pt complaing of falls.  this is the 1st time she has complained of this.  She says that she feels like she is being pushed.  no prodrome of dizziness or fatigue.  no palpitations, no LOC, does not feel like knees are giving out.  no dizziness with this.  happens 2- x/week.  happens while standing still.    Habits & Providers  Alcohol-Tobacco-Diet     Tobacco Status: never  Current Medications (verified): 1)  Reglan 10 Mg Tabs (Metoclopramide Hcl) .... Take One 30 Min Before Meals and At Bedtime 2)  Promethazine Hcl 25 Mg Supp (Promethazine Hcl) .... Take One Every 6 Hours Per Rectum 3)  Antivert 12.5 Mg Tabs (Meclizine Hcl) .... Take One Two Times A Day When You Feel Dizzy 4)  Lisinopril 10 Mg Tabs (Lisinopril) .... Take One Daily 5)  Zocor 40 Mg Tabs (Simvastatin) .... Take One At Bedtime  Allergies (verified): 1)  * Contrast Dye  Review of Systems  The patient denies anorexia, fever, chest pain, syncope, and abdominal pain.    Physical Exam  General:  Well-developed,well-nourished,in no acute distress; alert,appropriate and  cooperative throughout examination Eyes:  No corneal or conjunctival inflammation noted. EOMI. Perrla. Funduscopic exam benign, without hemorrhages, exudates or papilledema. Vision grossly normal. Ears:  External ear exam shows no significant lesions or deformities.  Otoscopic examination reveals clear canals, tympanic membranes are intact bilaterally without bulging, retraction, inflammation or discharge. Hearing is grossly normal bilaterally. Mouth:  Oral mucosa and oropharynx without lesions or exudates.  Teeth in good repair. Neck:  No deformities, masses, or tenderness noted. Lungs:  Normal respiratory effort, chest expands symmetrically. Lungs are clear to auscultation, no crackles or wheezes. Heart:  Normal rate and regular rhythm. S1 and S2 normal without gallop, murmur, click, rub or other extra sounds. Abdomen:  Bowel sounds positive,abdomen soft and non-tender without masses, organomegaly or hernias noted. obese Neurologic:  alert & oriented X3, cranial nerves II-XII intact, strength normal in all extremities, sensation intact to light touch, gait normal, DTRs symmetrical and normal, and Romberg negative.     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  much improved although she does get episodes of dizziness and nausea.  Precepted with Dr. Tressia Danas.  We considered vertigo as one possible etiology for nausea that may be related to her CVAs.  She is still taking phenergan.  Orders: [facility]- Est  Level 4 (57846)  Problem # 2:  VERTIGO (ICD-780.4) WOnder if this could be one part of her nausea and dizziness.  She is describing falls as well that are unrelated to the dizziness/nausea.  WIll likely need a neurology referral for further w/u.  Will try as needed meclizine to see if it helps. Her updated medication list for this problem includes:    Promethazine Hcl 25 Mg Supp (Promethazine hcl) .Marland Kitchen... Take one every 6 hours per  rectum    Antivert 12.5 Mg Tabs (Meclizine hcl) .Marland Kitchen... Take one two times a day when you feel dizzy  Orders: [facility]- Est  Level 4 (96295)  Problem # 3:  ESSENTIAL HYPERTENSION, BENIGN (ICD-401.1) BP still elevated.  SHe is not taking any meds except atenolol.  Asked her to add one at a time.  Will add HCTZ next time.  WIll try to regulate her BP to help with her modifiable risk factors for CVA.  Her updated medication list for this problem includes:    Lisinopril 10 Mg Tabs (Lisinopril) .Marland Kitchen... Take one daily  Orders: [facility]- Est  Level 4 (28413)  Problem # 4:  CVA (ICD-434.91) ? possible etiology of falls or her vertigo like symptoms.  work on BP control, send to neurology if symptoms persist. Orders: [facility]- Est  Level 4 (99214)  Complete Medication List: 1)  Reglan 10 Mg Tabs (Metoclopramide hcl) .... Take one 30 min before meals and at bedtime 2)  Promethazine Hcl 25 Mg Supp (Promethazine hcl) .... Take one every 6 hours per rectum 3)  Antivert 12.5 Mg Tabs (Meclizine hcl) .... Take one two times a day when you feel dizzy 4)  Lisinopril 10 Mg Tabs (Lisinopril) .... Take one daily 5)  Zocor 40 Mg Tabs (Simvastatin) .... Take one at bedtime  Patient Instructions: 1)  I have given you a new medicine, meclizine, for you to take for dizziness.  IF you are still having falls in 2 weeks, we can send you to the neurologist for evaluation 2)  please take the lisinopril for blood pressure and the simvastatin for your cholesterol.  Prescriptions: ZOCOR 40 MG TABS (SIMVASTATIN) take one at bedtime  #30 x 3   Entered and Authorized by:   Ellery Plunk MD   Signed by:   Ellery Plunk MD on 11/05/2009   Method used:   Electronically to        [facility] [phone]* (retail)       [address]       [place], [place]  [address]       Ph: [phone]       Fax: [phone]   RxID:   [id number] LISINOPRIL 10 MG TABS (LISINOPRIL) take one daily  #30 x 3   Entered and Authorized by:    Ellery Plunk MD   Signed by:   Ellery Plunk MD on 11/05/2009   Method used:   Electronically to        [facility] [phone]* (retail)       [address]       [place], [place]  [address]       Ph: [phone]       Fax: [phone]   RxID:   [id number] ANTIVERT 12.5 MG TABS (MECLIZINE HCL) take one two times a day when you feel dizzy  #60 x 2   Entered and Authorized by:   Ellery Plunk MD   Signed by:   Ellery Plunk  MD on 11/05/2009   Method used:   Electronically to        [facility] [phone]* (retail)       [address]       [place], [place]  [address]       Ph: [phone]       Fax: [phone]   RxID:   [id number]
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
## 2010-08-07 NOTE — Assessment & Plan Note (Signed)
Summary: vomiting and diarrhea X 2 weks , cramping in neck, bumped fro...   Vital Signs:  Patient profile:   50 year old female Weight:      229.1 pounds Temp:     98.5 degrees F oral Pulse rate:   89 / minute BP sitting:   159 / 95  (left arm) Cuff size:   regular  Vitals Entered By: Garen Grams LPN (June 05, 2010 2:24 PM) CC: vomiting and diarrhea x 2 weeks Is Patient Diabetic? No Pain Assessment Patient in pain? yes     Location: neck Type: cramping   Primary Provider:  Ellery Plunk MD  CC:  vomiting and diarrhea x 2 weeks.  History of Present Illness: Pt. complains that she has had vomiting and diarrhea for two weeks.  On further questioning, she says she had vomiting for about one week, then it stopped and she has had diarrhea for a week. She says the vomit was yellow, and that she vomited about twice a day.  She only felt nauseated sometimes, not constantly.  No blood in her vomit. She says that she has had diarrhea bout twice a day for the past week.  Says sometimes she had crampy abdominal pain but not constant.  She says no blood in the diarrhea and no dark black stools.  Pt. reports that her father, also a [facility] pt., passed away last week.  She acknowledges feeling sad, but says she has a good support system.  She has no SI.    Pt. denies weight loss, dizzyness, chest pain, SOB, syncope.    Habits & Providers  Alcohol-Tobacco-Diet     Alcohol drinks/day: 0     Tobacco Status: never  Allergies: 1)  * Contrast Dye  Review of Systems       See HPI.   Physical Exam  General:  Well-developed,well-nourished,in no acute distress; alert,appropriate and cooperative throughout examination Eyes:  No corneal or conjunctival inflammation noted. EOMI. Perrla. Funduscopic exam benign, without hemorrhages, exudates or papilledema. Vision grossly normal. Mouth:  Oral mucosa and oropharynx without lesions or exudates.  Teeth in good repair. Neck:  No deformities,  masses, or tenderness noted. Lungs:  Normal respiratory effort, chest expands symmetrically. Lungs are clear to auscultation, no crackles or wheezes. Heart:  Normal rate and regular rhythm. S1 and S2 normal without gallop, murmur, click, rub or other extra sounds. Abdomen:  Obese, soft, non-tender, normal bowel sounds, no distention, no masses, no guarding, no rigidity, no rebound tenderness, no hepatomegaly, and no splenomegaly.   Pulses:  R and L radial, dorsalis pedis and posterior tibial pulses are full and equal bilaterally Skin:  turgor normal, but skin very dry and cracked.    Impression & Recommendations:  Problem # 1:  DIARRHEA (ICD-787.91) I think it is most likely that pt. had a gastroenteritis that she is recovering from or that she is having irritable bowel type symptoms that are aggravated by her mourning her father's death.  I will check a BMET to make sure her electrolytes are WNL and she is not dehydrated.  Advised she may take immodium for relief and to stay hydrated.  Orders: Basic [facility] ([address]454) [facility]- Est Level  3 (09811)  Problem # 2:  EMESIS (ICD-787.03)  This has resolved.  Pt states she has phenergan at home she can take for nausea.  I asked her to return if this does not resolve.   Orders: [facility]- Est Level  3 (91478)  Complete Medication List:  1)  Reglan 10 Mg Tabs (Metoclopramide hcl) .... Take one 30 min before meals and at bedtime 2)  Lisinopril 20 Mg Tabs (Lisinopril) .... Take one daily 3)  Zocor 40 Mg Tabs (Simvastatin) .... Take one at bedtime 4)  Metoprolol Succinate 50 Mg Xr24h-tab (Metoprolol succinate) .... One by mouth daily 5)  Hydrochlorothiazide 25 Mg Tabs (Hydrochlorothiazide) .... Taek one daily 6)  Hydralazine Hcl 50 Mg Tabs (Hydralazine hcl) .... Take one three times a day 7)  Norvasc 10 Mg Tabs (Amlodipine besylate) .... Take one daily 8)  Tramadol Hcl 50 Mg Tabs (Tramadol hcl) .... One by mouth q 6 hours as needed for pain  Patient  Instructions: 1)  I am sorry you are not feeling good.  I am going to check your electrolytes to make sure you are not dehydrated.   2)  You can take immodium over the counter to help stop the diarrhea. 3)  It is important that you drink plenty of fluids to stay hydrated. 4)  If your nausea and diarrhea has not improved in two weeks, please call the office to be seen again.     Orders Added: 1)  Basic Met-[facility] [04540-98119] 2)  [facility]- Est Level  3 [14782]

## 2010-08-07 NOTE — Progress Notes (Signed)
Summary: triage  Phone Note Call from Patient Call back at Home Phone [phone]   Caller: Patient Summary of Call: Pt has headache and is throwing up. Initial call taken by: Clydell Hakim,  February 17, 2010 10:48 AM  Follow-up for Phone Call        states it is severe. has been seen here for this & is worse. says the last time she went to UC, they sent her to ED. wants to go there. told her yes, go to ED. she is on her way Follow-up by: Golden Circle RN,  February 17, 2010 10:50 AM

## 2010-08-07 NOTE — Assessment & Plan Note (Signed)
Summary: HIGH BP/KH   Vital Signs:  Patient profile:   49 year old female Weight:      227 pounds Temp:     98.3 degrees F oral Pulse rate:   78 / minute BP sitting:   202 / 128  (right arm) Cuff size:   large  Vitals Entered By: Jimmy Footman, CMA (March 12, 2010 11:04 AM)  Serial Vital Signs/Assessments:  Time      Position  BP       Pulse  Resp  Temp     By                     200/102                        Jimmy Footman, CMA  CC: High BP Is Patient Diabetic? No Pain Assessment Patient in pain? no        Primary Care Provider:  Ellery Plunk MD  CC:  High BP.  History of Present Illness:   1) HTN: BP elevated today while at dentist's office (to 150's / 100's) - sent for evaluation. Reports that she is taking all her medications but is unable to name any of them or state the schedule on which she takes them. Also reports that she is taking an OTC cough medicine but is unable to remember the name (she states that the box says it is safe to use with HTN). Not exercising or watching salt intake.   2) URI symptoms: Nasal congestion, cough with clear sputum x 3 days. Subjective fever / chills. Taking over the counter cough medication as above which helps. Denies sick contact.   ROS: Denies chest pain, dyspnea, LE edema, presyncope, nausea, emesis, vision change, urination change, focal neurological signs, wheeze, diarrhea, sore throat, rash, lethargy / weakness in interim since last visit   unable to perform accurate med rec as patient unable to name medications   Habits & Providers  Alcohol-Tobacco-Diet     Tobacco Status: never  Allergies (verified): 1)  * Contrast Dye  Physical Exam  General:  VS reviewed, hypertensive, obese, NAD  Eyes:  pupils equal, round and reactive to light , extraoccular movements intact , fundi w/o papilledema  Nose:  +congestion and rhinorrhea clear  Mouth:  moist membranes, mild posterior pharyngeal erythema w/o exudate or tonsillar  hypertrophy Neck:  no lymphadenopathy or JVD  Lungs:  Normal respiratory effort, chest expands symmetrically. Lungs are clear to auscultation, no crackles or wheezes. Heart:  Normal rate and regular rhythm. S1 and S2 normal without gallop, murmur, click, rub or other extra sounds. Abdomen:  obese, bowel sounds positive, abdomen soft and non-tender Pulses:  2+ radials  Extremities:  no edema  Neurologic:  alert & oriented X3, cranial nerves II-XII intact, strength normal in all extremities, sensation intact to light touch, and DTRs symmetrical and normal.     Impression & Recommendations:  Problem # 1:  ESSENTIAL HYPERTENSION, BENIGN (ICD-401.1) Assessment Deteriorated  Uncertain as to reason for deterioration, though patient has had adherence issues in the past. Also consider possibility that OTC cough med may be cause (though patient states that this medication is safe to use with HTN). Will increase metoprolol to 25 mg by mouth two times a day as below. Follow up in 1-2 weeks with PCP. DASH diet discussed. Would titrate meds accordingly if no improvement. Advised to pbring in all medications to next visit. No signs  of hypertensive emergency. Low risk of cardiovascular complications with planned dental procedure (tooth extraction with local anesthetic) per AHA guidelines.   Her updated medication list for this problem includes:    Lisinopril 20 Mg Tabs (Lisinopril) .Marland Kitchen... Take one daily    Metoprolol Tartrate 25 Mg Tabs (Metoprolol tartrate) .Marland Kitchen... Take one tab by mouth two times a day    Hydrochlorothiazide 25 Mg Tabs (Hydrochlorothiazide) .Marland Kitchen... Taek one daily    Hydralazine Hcl 50 Mg Tabs (Hydralazine hcl) .Marland Kitchen... Take one three times a day    Norvasc 10 Mg Tabs (Amlodipine besylate) .Marland Kitchen... Take one daily  Orders: [facility]- Est  Level 4 (04540)  Problem # 2:  URI (ICD-465.9) Assessment: New  Likely viral. Symptomatic treatment discussed. Advised to avoid NSAIDs with HTN. Reviewed safe to use  over the counter cough medications. Follow up with PCP.   Orders: [facility]- Est  Level 4 (98119)  Complete Medication List: 1)  Reglan 10 Mg Tabs (Metoclopramide hcl) .... Take one 30 min before meals and at bedtime 2)  Promethazine Hcl 25 Mg Supp (Promethazine hcl) .... Take one every 6 hours per rectum 3)  Antivert 12.5 Mg Tabs (Meclizine hcl) .... Take one two times a day when you feel dizzy 4)  Lisinopril 20 Mg Tabs (Lisinopril) .... Take one daily 5)  Zocor 40 Mg Tabs (Simvastatin) .... Take one at bedtime 6)  Metoprolol Tartrate 25 Mg Tabs (Metoprolol tartrate) .... Take one tab by mouth two times a day 7)  Hydrochlorothiazide 25 Mg Tabs (Hydrochlorothiazide) .... Taek one daily 8)  Hydralazine Hcl 50 Mg Tabs (Hydralazine hcl) .... Take one three times a day 9)  Norvasc 10 Mg Tabs (Amlodipine besylate) .... Take one daily 10)  Promethazine Hcl 25 Mg Tabs (Promethazine hcl) .... One tab by mouth q6hrs as needed emesis (patient also has suppositories available)  Patient Instructions: 1)  INCREASE your METORPOLOL to 25 mg (one whole tablet) twice a day. 2)  Follow up in 1 week with Dr. Hulen Luster 3)  Make sure your bring ALL your medications to your appointments.  4)  Take over the counter cough medication (make sure that the cough medication does not have Sudafed or pseudoephedrine, so that it is safe to use with high blood pressure). It will usually have a red heart on the box to show that it is safe to use. Prescriptions: METOPROLOL TARTRATE 25 MG TABS (METOPROLOL TARTRATE) take one tab by mouth two times a day  #60 x 0   Entered and Authorized by:   Bobby Rumpf  MD   Signed by:   Bobby Rumpf  MD on 03/12/2010   Method used:   [facility] [address] [phone]* (retail)       [address]       [place], [place]  [address]       Ph: [phone] or [phone]       Fax: [phone]   RxID:   [id number]   Appended Document: HIGH  BP/KH Denied headache as well.

## 2010-08-07 NOTE — Progress Notes (Signed)
Summary: phn msg  Phone Note Call from Patient Call back at [facility] Phone [phone]   Caller: Patient Summary of Call: BP is 183/138 - not sure what to do. Initial call taken by: De Nurse,  July 22, 2010 3:35 PM  Follow-up for Phone Call        spoke with patient and advised her to go to urgent care now as soon as possible. she checked BP with her home monitor. she voices understanding and states she will go now. Follow-up by: [name] RN,  July 22, 2010 4:54 PM

## 2010-08-07 NOTE — Assessment & Plan Note (Signed)
Summary: h/fup,tcb   Vital Signs:  Patient profile:   50 year old female Weight:      226.3 pounds Temp:     97.2 degrees F oral Pulse rate:   70 / minute BP sitting:   171 / 96  (right arm) Cuff size:   regular  Vitals Entered By: Garen Grams LPN (October 18, 2009 2:16 PM) CC: hfu Is Patient Diabetic? No Pain Assessment Patient in pain? no        CC:  hfu.  History of Present Illness: Here for hospital f/u- recent hospitalizaton for n/v that resolved on admission and she was able to eat full meals. Today she complains ot persistent nausea so bad she cannot hold down food, water, or her meds. She denies abd pain, emesis is yellow, NBNB  Looking through her hx-she has been to [facility] ED and has been admitted for N/V/D many times since 2001.  In 2001, colonoscopy and EGD were done.  She was found to have reflux and a hiatal hernia.  Lipase, LFTs checked mult times, always normal.  Lipids normal,  TSH normal, A1C 5.8, Abd xray at recent hospitalization normal, no obstruction.   Habits & Providers  Alcohol-Tobacco-Diet     Tobacco Status: never  Allergies (verified): 1)  * Contrast Dye  Past History:  Past Medical History: Chiari I malformation decompressed 2002 Cardiac cath 2007- normal coronary arteries hiatal hernia CVA x 2 2008 HTN HL mult TIAs Hx of migraine  Past Surgical History: Chiari I malformation decompressed 2002 3 C sections BTL c1 laminectomy B breast reduction  Family History: mother CAD, CVA, died at 3  Social History: lives with daughter, no alcohol , tobacco, or illicit drugsSmoking Status:  never  Review of Systems       The patient complains of anorexia, weight loss, and muscle weakness.  The patient denies fever, chest pain, syncope, hemoptysis, abdominal pain, melena, and hematochezia.    Physical Exam  General:  alert, well-developed, well-nourished, well-hydrated, and overweight-appearing.   Head:  normocephalic and atraumatic.     Eyes:  vision grossly intact, pupils equal, pupils round, and pupils reactive to light.   Ears:  R ear normal and L ear normal.   Nose:  no external deformity.   Mouth:  pharynx pink and moist.   Neck:  No deformities, masses, or tenderness noted. Lungs:  Normal respiratory effort, chest expands symmetrically. Lungs are clear to auscultation, no crackles or wheezes. Heart:  Normal rate and regular rhythm. S1 and S2 normal without gallop, murmur, click, rub or other extra sounds. Abdomen:  morbidly obese, limited exam, no tenderness Genitalia:  deferred due to nausea Msk:  normal ROM and no joint swelling.   Pulses:  R and Lradial,dorsalis pedis and posterior tibial pulses are full and equal bilaterally Extremities:  No clubbing, cyanosis, edema, or deformity noted with normal full range of motion of all joints.   Neurologic:  no focal deficits alert & oriented X3 and cranial nerves II-XII intact.   Skin:  turgor normal, color normal, and no rashes.  several tattoos Psych:  anxious appearing.     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  Her hx of N/V is long and goes back at least to 2001 in our records.  Has had normal colonoscopy but has hiatal hernia  and GERD.  Has had mult ED visits for N/V but has not filled any perscriptions for antiemetics.  Think this is functional GI disorder, since no pain, chronic process.  I can't find that a gastric emptying study has been done.  may consider in the future.  Started Reglan today.  Told her to fill her phenergan suppository so she can take her BP meds.  RTC in 2 weeks to check BP.    Problem # 2:  HEALTH MAINTENANCE EXAM (ICD-V70.0) Assessment: New deferred Pap due to Nausea.  will do at next visit.  Pt likely due for mammogram.  Lipids checked in  hospital and are ok.  HTN is an issue, will check how she is doing when she takes meds.  SHe was doing well with BP in hospital.    Complete Medication List: 1)  Reglan 10 Mg Tabs (Metoclopramide hcl) .... Take one 30 min before meals and at bedtime 2)  Promethazine Hcl 25 Mg Supp (Promethazine hcl) .... Take one every 6 hours per rectum  Patient Instructions: 1)  It was nice to see you today 2)  come back in 2-3 weeks to see how you are doing. 3)  I have started a medicine called reglan or metachlopramide.  This will help the food move through your body and prevent vomitting. 4)  Please fill your prescriptions for anti nausea medicines 5)  Please try to take your blood pressure medicine.  It is important to have that under good control Prescriptions: PROMETHAZINE HCL 25 MG SUPP (PROMETHAZINE HCL) take one every 6 hours per rectum  #20 x 2   Entered and Authorized by:   Ellery Plunk MD   Signed by:   Ellery Plunk MD on 10/18/2009   Method used:   Electronically to        [facility] [phone]* (retail)       [address]       [place], [place]  [address]       Ph: [phone]       Fax: [phone]   RxID:   [id number] REGLAN 10 MG TABS (METOCLOPRAMIDE HCL) take one 30 min before meals and at bedtime  #120 x 6   Entered and Authorized by:   Ellery Plunk MD   Signed by:   Ellery Plunk MD on 10/18/2009   Method used:   Electronically to        [facility] [phone]* (retail)       [address]       [place], [place]  [address]       Ph: [phone]       Fax: [phone]   RxID:   [id number]

## 2010-08-07 NOTE — Miscellaneous (Signed)
Summary: Re: neurology referral  Clinical Lists Changes  received notification from [facility] that they are unable to complete the referral for neurology at this time due to lack of volunteer physicians in this speciality group. they will notify patient when they can process the referral. [name] RN  December 06, 2009 4:57 PM      Has been seeing Dr. Ayesha Mohair, will forward note to her, as she requested referral.  Lindsey [name]

## 2010-08-07 NOTE — Progress Notes (Signed)
  Phone Note Call from Patient   Caller: Patient Call For: [phone] Summary of Call: Patient c/o of severe cramps x 3 wks to back of legs.   Initial call taken by: Abundio Miu,  July 17, 2010 11:12 AM  Follow-up for Phone Call        another person answered & said she would have the pt call us to make an appt Follow-up by: Golden Circle RN,  July 17, 2010 11:37 AM  Additional Follow-up for Phone Call Additional follow up Details #1::        I called her. made appt for monday with Dr. Gwendolyn Grant as pcp is not avaiable until wed Additional Follow-up by: Golden Circle RN,  July 18, 2010 4:53 PM

## 2010-08-07 NOTE — Progress Notes (Signed)
Summary: Ref Req  Phone Note Call from Patient Call back at Home Phone [phone]   Caller: Patient Summary of Call: Pt says she needs to be referred to a neurologist. Initial call taken by: Clydell Hakim,  February 03, 2010 9:50 AM  Follow-up for Phone Call        We were informed back in june that there were no availible physicians participating with [facility].  We can try again but I doubt there will be any different information.  Will forward to MD for next step Follow-up by: Jone Baseman CMA,  February 03, 2010 1:51 PM  Additional Follow-up for Phone Call Additional follow up Details #1::        could we refer her to [facility] or [facility]? Additional Follow-up by: Ellery Plunk MD,  February 03, 2010 7:20 PM    Additional Follow-up for Phone Call Additional follow up Details #2::    patient now has medicaid. referral faxed to [facility]. Follow-up by: [name] RN,  February 04, 2010 10:29 AM

## 2010-08-07 NOTE — Miscellaneous (Signed)
Summary: orders  Clinical Lists Changes  Problems: Added new problem of HYPERTENSION, BENIGN ESSENTIAL (ICD-401.1) Orders: Added new Test order of Basic [facility] [phone]) - Signed

## 2010-08-07 NOTE — Assessment & Plan Note (Signed)
Summary: THROWING UP,TCB   Vital Signs:  Patient profile:   50 year old female Weight:      230.5 pounds Temp:     98.3 degrees F oral Pulse rate:   99 / minute BP sitting:   163 / 89  (left arm) Cuff size:   regular  Vitals Entered By: Garen Grams LPN (February 04, 2010 9:18 AM) CC: vomiting x 1 week Is Patient Diabetic? No Pain Assessment Patient in pain? yes     Location: lower back   Primary Care Provider:  Alvia Grove DO  CC:  vomiting x 1 week.  History of Present Illness: 1) Emesis x 1 week: 26 F PMH CVA x2, Chiari malformation (type 1) s/p decompression, HTN, migraines p/w nausea and vomiting x 1 week. Reports that she has had 3-4 episodes per day of non-bloody, non bilious emesis of food and clear fluid. Was seen in the ER with this same issue on 6/30 - treated with oral Zofran, also treated with Macrobid for concernfor UTI (though negative nitrites, negative LE on a non clean catch UA). Reports feeling lightheaded; at first she reports she "passed out " but felt lightheaded and went to lie down. Reports history of intermittent vomiting similar to this over the past 3-4 years, cause unknown; last episode was "some time last year". Last bowel movement was yesterday but she reports history of chronic constipation with IBS.  Mild nausea this AM after eating a biscuit.   ROS: Denies headache, vision change, dyspnea, chest pain, sick contacts, melena, hematochezia, numbness, weakness, change in urination, neck pain or stiffness, abdominal pain, abnormal vaginal bleeding.   Habits & Providers  Alcohol-Tobacco-Diet     Tobacco Status: never  Current Medications (verified): 1)  Reglan 10 Mg Tabs (Metoclopramide Hcl) .... Take One 30 Min Before Meals and At Bedtime 2)  Promethazine Hcl 25 Mg Supp (Promethazine Hcl) .... Take One Every 6 Hours Per Rectum 3)  Antivert 12.5 Mg Tabs (Meclizine Hcl) .... Take One Two Times A Day When You Feel Dizzy 4)  Lisinopril 20 Mg Tabs  (Lisinopril) .... Take One Daily 5)  Zocor 40 Mg Tabs (Simvastatin) .... Take One At Bedtime 6)  Metoprolol Tartrate 25 Mg Tabs (Metoprolol Tartrate) .... Take One Half Tab Two Times A Day 7)  Hydrochlorothiazide 25 Mg Tabs (Hydrochlorothiazide) .... Taek One Daily 8)  Hydralazine Hcl 50 Mg Tabs (Hydralazine Hcl) .... Take One Three Times A Day 9)  Norvasc 10 Mg Tabs (Amlodipine Besylate) .... Take One Daily  Allergies (verified): 1)  * Contrast Dye  Physical Exam  General:  obese, pleasant, well appearing, NAD  Eyes:  pupils equal, round and reactive to light, extraoccular movements intact , normal fundi  Mouth:  Oral mucosa and oropharynx without lesions or exudates.  Teeth in good repair. Heart:  2+ radials  Abdomen:  exam limited by central obesity, but soft, non tender, non distended, no rebound or guarding  Pulses:  2+ radials  Neurologic:  alert & oriented X3 and cranial nerves II-XII intact.   Skin:  good turgor    Impression & Recommendations:  Problem # 1:  EMESIS (ICD-787.03) Assessment Deteriorated  Will refill antiemeitics, add prescription for phenergan by mouth. Continue opther medications as before. Advised regarding need to stay well hydrated, control emesis. Well appearing and well hydrated today. No signs of papilledema on exam, no headaches, normal neurological exam - very low likelihood of intracranial pathology as cause of symptoms - will not  image at this time. History of complex migraine but no headache with current symptoms. Cyclic emesis is at top of differential. Hisotry of chronic constipation but normal abdominal exam, no concern for bowel obstruction. No peritoneal signs on exam. Follow up with PCP as needed.   Orders: [facility]- Est Level  3 (95621)  Complete Medication List: 1)  Reglan 10 Mg Tabs (Metoclopramide hcl) .... Take one 30 min before meals and at bedtime 2)  Promethazine Hcl 25 Mg Supp (Promethazine hcl) .... Take one every 6 hours per rectum 3)   Antivert 12.5 Mg Tabs (Meclizine hcl) .... Take one two times a day when you feel dizzy 4)  Lisinopril 20 Mg Tabs (Lisinopril) .... Take one daily 5)  Zocor 40 Mg Tabs (Simvastatin) .... Take one at bedtime 6)  Metoprolol Tartrate 25 Mg Tabs (Metoprolol tartrate) .... Take one half tab two times a day 7)  Hydrochlorothiazide 25 Mg Tabs (Hydrochlorothiazide) .... Taek one daily 8)  Hydralazine Hcl 50 Mg Tabs (Hydralazine hcl) .... Take one three times a day 9)  Norvasc 10 Mg Tabs (Amlodipine besylate) .... Take one daily 10)  Promethazine Hcl 25 Mg Tabs (Promethazine hcl) .... One tab by mouth q6hrs as needed emesis (patient also has suppositories available) Prescriptions: PROMETHAZINE HCL 25 MG TABS (PROMETHAZINE HCL) one tab by mouth q6hrs as needed emesis (patient also has suppositories available)  #30 x 0   Entered and Authorized by:   Bobby Rumpf  MD   Signed by:   Bobby Rumpf  MD on 02/04/2010   Method used:   [facility]        [facility] [address] [phone]* (retail)       [address]       [place], [place]  [address]       Ph: [phone] or [phone]       Fax: [phone]   RxID:   [id number]

## 2010-08-07 NOTE — Assessment & Plan Note (Signed)
Summary: BP CHECK/KH  Nurse Visit  patient in office early at 8:30 AM requesting BP check.  BP checked manually first LA unable to hear , RA 162/120 manually. Checked with [facility] LA 166/120, RA 155/115. patient did not bring her meds with her today. RN questioned about the med ( lisinopril that MD told her to increase at last visit. ) she states she has been taking two of what she had on hand and does have the new RX and has not started that yet. she is unable to name the medication , but has been taking 2 daily of one of her meds she knows. she states she has so many meds she can't remember the names.  states she did take meds this AM. she denies any headache , states she may have slight dizziness. consulted Dr. Hulen Luster. It was decided that patient would go home and get her meds and bring back so we can verify what she has been taking before any changes are made. patient states she will be able to do this and come back this AM. [name] RN  December 20, 2009 11:37 AM  patient never returned to clinic today. Dr. Hulen Luster notified. [name] RN  December 20, 2009 5:15 PM   Allergies: 1)  * Contrast Dye  Orders Added: 1)  No Charge Patient Arrived (NCPA0) [NCPA0]  Appended Document: BP CHECK/KH have tried several times to contact patient and have left messages to call back . last message left today.

## 2010-08-07 NOTE — Miscellaneous (Signed)
Summary: Re: neurology referral.  Clinical Lists Changes    received call from [facility], patient owes them a balance. they contacted patient about this yesterday and she will be unable to pay.  will call patient and ask what she wants to do. we had appointment schedule at [facility] but she wanted that cancelled.  [name] RN  February 11, 2010 9:36 AM  spoke with patient and she states she needs to see somebody and is willing to go to [place] now. appointment rescheduled at [facility] outpatient neurology for 04/10/2010 at 7:45 AM. [name] RN  February 11, 2010 9:52 AM

## 2010-08-07 NOTE — Miscellaneous (Signed)
Summary: phone hosp f/u  Clinical Lists Changes states she still feels the same way vomited 3 times saturday & 2 times yesterday. any movement or going out in a car makes her vomit. states she is going to try to eat. advised crackers or toast to see how well she does with that. no fried, greasy,spicy foods. she is still taking the phenergan. states she gets cold often. LMP 2-3 wks ago she is very concerned as to why she is having this problem. states maybe it is mold. has been in the same home x 23 yrs. to discuss with md. advised to keep New Pt. appt on friday. told her if she has concerns or questions we have a doctor on call when we are closed.Marland KitchenMarland KitchenGolden Circle RN  October 14, 2009 11:59 AM  Medications: Added new medication of PROCHLORPERAZINE MALEATE 10 MG TABS (PROCHLORPERAZINE MALEATE) take 1 pill by mouth Q6hrs as needed nausea/vomitting - Signed Rx of PROCHLORPERAZINE MALEATE 10 MG TABS (PROCHLORPERAZINE MALEATE) take 1 pill by mouth Q6hrs as needed nausea/vomitting;  #15 x 0;  Signed;  Entered by: Golden Circle RN;  Authorized by: Alvia Grove DO;  Method used: Electronically to [facility] [phone]*, [address], [place], [place]  [address], Ph: [phone], Fax: [phone]    Prescriptions: PROCHLORPERAZINE MALEATE 10 MG TABS (PROCHLORPERAZINE MALEATE) take 1 pill by mouth Q6hrs as needed nausea/vomitting  #15 x 0   Entered by:   Golden Circle RN   Authorized by:   Alvia Grove DO   Signed by:   Golden Circle RN on 10/16/2009   Method used:   Electronically to        [facility] [phone]* (retail)       [address]       [place], [place]  [address]       Ph: [phone]       Fax: [phone]   RxID:   [id number]   Sent home from hospital with Zofran, if not working ok to try compazine 10mg  by mouth Q6hrs as needed, ok to mail or fax to pharm if we know which one she uses.    Agree with plan to keep appt on 10-18-09.  bad connection. person who  answered the phone states he will call me right back.Marland KitchenMarland KitchenGolden Circle RN  October 16, 2009 9:35 AM   pt calling back and left cell number to call her at [phone].Clydell Hakim  October 16, 2009 9:50 AM      called pt. told her I will send thi snew drug to help with the nausea. states she will go get it. sent to [facility] on [address]. she will keep her appt friday.Golden Circle RN  October 16, 2009 10:00 AM

## 2010-08-07 NOTE — Progress Notes (Signed)
Summary: Headache   Phone Note Call from Patient   Caller: Patient Call For: [phone] Summary of Call: [name] requesting something to be called to pharmacy for headache.  She c/o of pain being non stop and seem to be getting worse. Was seen in office yesterday.  [facility] [address] [facility]. Initial call taken by: Britta Mccreedy mcgregor  Follow-up for Phone Call        Called and left message for patient stating that I could not call in anything for headache without evaluating her - advised if she was having severe headache she should call to be seen for appointment of if no appointments were available she should go to the Urgent Care given her hypertensive urgency yesterday Follow-up by: Bobby Rumpf  MD,  March 13, 2010 1:50 PM  Additional Follow-up for Phone Call Additional follow up Details #1::        pt is returning call- pls call asap Additional Follow-up by: De Nurse,  March 13, 2010 1:59 PM    Additional Follow-up for Phone Call Additional follow up Details #2::    Advised patient to call triage desk to be worked in to my schedule today given concern with worsening headache today and hypertensive urgency yesterday  Follow-up by: Bobby Rumpf  MD,  March 13, 2010 2:04 PM  Additional Follow-up for Phone Call Additional follow up Details #3:: Details for Additional Follow-up Action Taken: she was seen by Dr. Wallene Huh Additional Follow-up by: Golden Circle RN,  March 13, 2010 3:12 PM

## 2010-08-07 NOTE — Assessment & Plan Note (Signed)
Summary: leg/back pain/bp/eo   Vital Signs:  Patient profile:   50 year old female Temp:     98.2 degrees F oral Pulse rate:   72 / minute  Vitals Entered By: Tessie Fass CMA (July 23, 2010 10:01 AM) CC: back and leg pain Is Patient Diabetic? No Pain Assessment Patient in pain? yes     Location: lower back, bilateral leg Intensity: 10   Primary Care Provider:  Ellery Plunk MD  CC:  back and leg pain.  History of Present Illness: 50 yo F:  1. Back Pain: x 3 weeks, low back > paraspinal mm, with radiation down both legs, has pain BOTH with walking and with lying down, can't find a comfortable position, worse with flexion. No injury - recent or remote, no FamHx of Hx of osteoporosis, no long-term steroid use, no incontinence, no increased LE weakness, no falls, no numbness/tingling.   2. HTN:  Patient is prescribed several HTN medications. After a long discussion re: the dangers of increasing a medication if the patient is not actually taking them, the patient continues to endorse taking all of her medications as prescribed. She states that since her CVAs, she has a difficult time with memory - so she cannot tell me the names of her medications or how often she takes them. She uses a pill box, she has help from her daughter, a med tech. She has been tacking NSAIDs for low back pain. She denies CP, SOB, LE edema, new focal neuro s/s.   Current Medications (verified): 1)  Reglan 10 Mg Tabs (Metoclopramide Hcl) .... Take One 30 Min Before Meals and At Bedtime 2)  Lisinopril 20 Mg Tabs (Lisinopril) .... Take One Daily 3)  Zocor 40 Mg Tabs (Simvastatin) .... Take One At Bedtime 4)  Metoprolol Succinate 50 Mg Xr24h-Tab (Metoprolol Succinate) .... One By Mouth Daily 5)  Hydrochlorothiazide 25 Mg Tabs (Hydrochlorothiazide) .... Taek One Daily 6)  Hydralazine Hcl 50 Mg Tabs (Hydralazine Hcl) .... Take One Three Times A Day 7)  Norvasc 10 Mg Tabs (Amlodipine Besylate) .... Take One  Daily 8)  Tramadol Hcl 50 Mg  Tabs (Tramadol Hcl) .... One By Mouth Q 6 Hours As Needed For Pain  Allergies (verified): 1)  * Contrast Dye PMH-FH-SH reviewed for relevance  Review of Systems      See HPI  Physical Exam  General:  Vital signs reviewed. Well-developed, well-nourished patient in NAD.  Awake and cooperative.  Eyes:  PERRL. EOMI. Lungs:  Normal respiratory effort, chest expands symmetrically. Lungs are clear to auscultation, no crackles or wheezes. Heart:  Regular rate and rhythm without murmur, rub, or gallop.  Normal S1/S2. Msk:  Lumbar Back: No obvious abnormalities. Truncal obesity. No TTP over spinus processes. TTP over bilateral paraspinal mm. No SI TTP. No hip TTP. Negative SLR. Decreased flexion 2/2 pain. Normal rotation, sidebending, and extension.  Pulses:  Good pulses BL upper and lower extremities. Extremities:  No clubbing, cyanosis, edema, or deformity noted with normal full range of motion of all joints.  No redness, warmth, edema BL LE's.   Neurologic:  Full ROM BL LE's.  CN II-XII intact.  No motor or sensory deficits noted BL upper or lower extremities.  Gait exam WNL.  DTRs symmetrical and normal and finger-to-nose normal.   Psych:  Flat affect.     Impression & Recommendations:  Problem # 1:  BACK PAIN, LUMBAR, WITH RADICULOPATHY (ICD-724.4) Assessment New  Patient with no red flags. Her endorsed pain is  not c/w exam findings. LE pain may be helped with neurontin, but will leave this up to her PCP. Rx Tramadol with instructions to f/u if not improving in 3 weeks and precautions for quicker f/u. Advised patient to take Tylenol instead of NSAIDs. Her updated medication list for this problem includes:    Tramadol Hcl 50 Mg Tabs (Tramadol hcl) ..... One by mouth q 6 hours as needed for pain  Orders: [facility]- Est  Level 4 ([address]562)  Problem # 2:  ESSENTIAL HYPERTENSION, BENIGN (ICD-401.1) Assessment: Improved  Uncontrolled. Better BP in office today than  normal. Advised patient to f/u in 1 week with PCP. Will need another BMP at that time as Cr very slightly increased at ED yesterday.  Her updated medication list for this problem includes:    Lisinopril 20 Mg Tabs (Lisinopril) .Marland Kitchen... Take one daily    Metoprolol Succinate 50 Mg Xr24h-tab (Metoprolol succinate) ..... One by mouth daily    Hydrochlorothiazide 25 Mg Tabs (Hydrochlorothiazide) .Marland Kitchen... Taek one daily    Hydralazine Hcl 50 Mg Tabs (Hydralazine hcl) .Marland Kitchen... Take one three times a day    Norvasc 10 Mg Tabs (Amlodipine besylate) .Marland Kitchen... Take one daily  Orders: [facility]- Est  Level 4 (13086)  Complete Medication List: 1)  Reglan 10 Mg Tabs (Metoclopramide hcl) .... Take one 30 min before meals and at bedtime 2)  Lisinopril 20 Mg Tabs (Lisinopril) .... Take one daily 3)  Zocor 40 Mg Tabs (Simvastatin) .... Take one at bedtime 4)  Metoprolol Succinate 50 Mg Xr24h-tab (Metoprolol succinate) .... One by mouth daily 5)  Hydrochlorothiazide 25 Mg Tabs (Hydrochlorothiazide) .... Taek one daily 6)  Hydralazine Hcl 50 Mg Tabs (Hydralazine hcl) .... Take one three times a day 7)  Norvasc 10 Mg Tabs (Amlodipine besylate) .... Take one daily 8)  Tramadol Hcl 50 Mg Tabs (Tramadol hcl) .... One by mouth q 6 hours as needed for pain  Patient Instructions: 1)  It was nice to see you today! 2)  Continue to take your blood pressure medications and follow up with your PCP in 1 week to recheck your blood pressure as well as to go over your medications. 3)  Take Tylenol as needed for low back pain with Ultram for more severe low back pain. 4)  Continue to use your CPAP at night. 5)  Follow-up in 2-3 weeks if your back pain continues. If you develop any of the symptoms that we discussed (weakness, falls, incontinence, etc), please call or go to the emergency department. Prescriptions: TRAMADOL HCL 50 MG  TABS (TRAMADOL HCL) one by mouth q 6 hours as needed for pain  #30 x 1   Entered and Authorized by:   Helane Rima DO   Signed by:   Helane Rima DO on 07/23/2010   Method used:   Print then Give to Patient   RxID:   [id number]    Orders Added: 1)  [facility]- Est  Level 4 [[address]010]

## 2010-08-07 NOTE — Assessment & Plan Note (Signed)
Summary: F/U VISIT/[facility]   Vital Signs:  Patient profile:   50 year old female Weight:      227.3 pounds Temp:     98.1 degrees F oral Pulse rate:   82 / minute BP sitting:   163 / 109  (left arm) Cuff size:   regular  Vitals Entered By: Garen Grams LPN (Nov 21, 2009 1:59 PM) CC: hypertensive urgency and CP Is Patient Diabetic? No Pain Assessment Patient in pain? yes     Location: chest Intensity: 10   Primary Care Provider:  Alvia Grove DO  CC:  hypertensive urgency and CP.  History of Present Illness: pt presents with 3 days of dizziness and weakness and CP since this AM.  She reports 10/10 CP and describes it as "someone is pushing on my chest".  No sweating, no jaw or arm pain, no radiation of pain.  Has some SOB when she lays down.  Taking lisinopril 10mg  and atenolol 50mg  daily. BPs have been high in past but pt has discontinued the medication changes we made in the hospital.  Pt had normal cath in 2007 and was ruled out for chest pain in 10/2009.  This pain is similar to previous episodes of pain.  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 Take One 30 Min Before Meals and At Bedtime 2)  Promethazine Hcl 25 Mg Supp (Promethazine Hcl) .... Take One Every 6 Hours Per Rectum 3)  Antivert 12.5 Mg Tabs (Meclizine Hcl) .... Take One Two Times A Day When You Feel Dizzy 4)  Lisinopril 10 Mg Tabs (Lisinopril) .... Take One Daily 5)  Zocor 40 Mg  Tabs (Simvastatin) .... Take One At Bedtime 6)  Atenolol 50 Mg Tabs (Atenolol)  Allergies (verified): 1)  * Contrast Dye  Past History:  Past Medical History: Last updated: 23-Oct-2009 Chiari I malformation decompressed 2002 Cardiac cath 2007- normal coronary arteries hiatal hernia CVA x 2 2008 HTN HL mult TIAs Hx of migraine  Past Surgical History: Last updated: 10-23-09 Chiari I malformation decompressed 2002 3 C sections BTL c1 laminectomy B breast reduction  Family History: Last updated: 10-23-2009 mother CAD, CVA, died at 64  Social History: Last updated: October 23, 2009 lives with daughter, no alcohol , tobacco, or illicit drugs  Risk Factors: Smoking Status: never (11/21/2009)  Review of Systems       The patient complains of chest pain and headaches.  The patient denies anorexia, fever, weight loss, vision loss, and syncope.    Physical Exam  General:  Well-developed,well-nourished, looks fairly comfortable,  alert,appropriate and cooperative throughout examination obese, sitting up and reading a magazine Head:  Normocephalic and atraumatic without obvious abnormalities. No apparent alopecia or balding. Lungs:  Normal respiratory effort, chest expands symmetrically. Lungs are clear to auscultation, no crackles or wheezes. Heart:  Normal rate and regular rhythm. S1 and S2 normal without gallop, murmur, click, rub or other extra sounds. Abdomen:  Bowel sounds positive,abdomen soft and non-tender without masses, organomegaly or hernias noted. Extremities:  No clubbing, cyanosis, edema, or deformity noted with normal full range of motion of all joints.     Impression & Recommendations:  Problem # 1:  CHEST PAIN UNSPECIFIED (ICD-786.50) Assessment New Pt has had previous clear cath in 07 and ruled out in april 2011.  had not had a recent stress test.  Considering risk factors, HTN, HL, hx of TIA and stroke, I think she needs to be ruled out again given symptoms.   I will give 0.2 mg clonidine and ASA 325 here and send over in ambulance to ED for further eval.  EKG did not show STEMI or or ST depression. Pt will need to add medications to her BP regimen after this acute episode.  She is agreeable to this in spite of discontinuing previous new medicaiton. Orders: EKG- [facility] (EKG) [facility]- Est Level  5 ([address])  Complete Medication List: 1)  Reglan 10 Mg Tabs (Metoclopramide hcl) .... Take one 30 min before meals and at bedtime 2)  Promethazine Hcl 25 Mg Supp (Promethazine hcl) .... Take one every 6 hours per rectum 3)  Antivert 12.5 Mg Tabs (Meclizine hcl) .... Take one two times a day when you feel dizzy 4)  Lisinopril 10 Mg Tabs (Lisinopril) .... Take one daily 5)  Zocor 40 Mg Tabs (Simvastatin) .... Take one at bedtime 6)  Atenolol 50 Mg Tabs (Atenolol)  Appended Document: meds given   Medication Administration  Medication # 1:    Medication: ASA 325mg  tab    Diagnosis: CHEST PAIN UNSPECIFIED (ICD-786.50)    Dose: 1 tablet    Route: po    Exp Date: 07/25/2010    Lot #: [id number]    Mfr: major    Comments: Pt chewed ASA    Patient tolerated medication without complications    Given by: Jone Baseman CMA (Nov 21, 2009 3:44 PM)  Medication # 2:    Medication: Clonidine 0.1mg  tab    Diagnosis: CHEST PAIN UNSPECIFIED (ICD-786.50)    Dose: 1 tablet    Route: po    Exp Date: 10/04/2009    Lot #: [id number]    Mfr: UDL    Comments: 0.2mg  given x 1    Patient tolerated medication without complications    Given by: Jone Baseman CMA (Nov 21, 2009 3:45 PM)  Orders Added: 1)  ASA 325mg  tab [EMRORAL] 2)  Clonidine 0.1mg  tab [EMRORAL]

## 2010-08-07 NOTE — Assessment & Plan Note (Signed)
Summary: bp is high,tcb   Vital Signs:  Patient profile:   50 year old female Weight:      228.7 pounds Temp:     98.1 degrees F oral Pulse rate:   89 / minute BP sitting:   161 / 96  (left arm) Cuff size:   large  Vitals Entered By: Garen Grams LPN (May 06, 2010 9:05 AM) CC: f/u bp, sleep study results Is Patient Diabetic? No Pain Assessment Patient in pain? yes     Location: headache   Primary Care Provider:  Ellery Plunk MD  CC:  f/u bp and sleep study results.  History of Present Illness: had sleep study on 10/13 and felt great after sleeping with mask.  does not feel claustraphobic.  has slight HA today but otherwise well.  Wants to get the cpap ordered today.  Habits & Providers  Alcohol-Tobacco-Diet     Alcohol drinks/day: 0     Tobacco Status: never  Current Medications (verified): 1)  Reglan 10 Mg Tabs (Metoclopramide Hcl) .... Take One 30 Min Before Meals and At Bedtime 2)  Lisinopril 20 Mg Tabs (Lisinopril) .... Take One Daily 3)  Zocor 40 Mg Tabs (Simvastatin) .... Take One At Bedtime 4)  Metoprolol Succinate 50 Mg Xr24h-Tab (Metoprolol Succinate) .... One By Mouth Daily 5)  Hydrochlorothiazide 25 Mg Tabs (Hydrochlorothiazide) .... Taek One Daily 6)  Hydralazine Hcl 50 Mg Tabs (Hydralazine Hcl) .... Take One Three Times A Day 7)  Norvasc 10 Mg Tabs (Amlodipine Besylate) .... Take One Daily 8)  Tramadol Hcl 50 Mg  Tabs (Tramadol Hcl) .... One By Mouth Q 6 Hours As Needed For Pain  Allergies (verified): 1)  * Contrast Dye  Review of Systems  The patient denies anorexia, fever, weight loss, chest pain, and syncope.    Physical Exam  General:  VS reviewed, obese, well-developed, well-nourished, and well-hydrated.   Head:  normocephalic and atraumatic.   Lungs:  Normal respiratory effort, chest expands symmetrically. Lungs are clear to auscultation, no crackles or wheezes. Heart:  Normal rate and regular rhythm. S1 and S2 normal without gallop,  murmur, click, rub or other extra sounds.   Impression & Recommendations:  Problem # 1:  SLEEP APNEA (ICD-780.57) Assessment New sleep study showed obstructive sleep apnea better with CPAP.  will order for pt today.  will have pt f/u in one month for reeval of BP on the mask. Orders: DME Referral (DME) [facility]- Est Level  3 ([address])  Complete Medication List: 1)  Reglan 10 Mg Tabs (Metoclopramide hcl) .... Take one 30 min before meals and at bedtime 2)  Lisinopril 20 Mg Tabs (Lisinopril) .... Take one daily 3)  Zocor 40 Mg Tabs (Simvastatin) .... Take one at bedtime 4)  Metoprolol Succinate 50 Mg Xr24h-tab (Metoprolol succinate) .... One by mouth daily 5)  Hydrochlorothiazide 25 Mg Tabs (Hydrochlorothiazide) .... Taek one daily 6)  Hydralazine Hcl 50 Mg Tabs (Hydralazine hcl) .... Take one three times a day 7)  Norvasc 10 Mg Tabs (Amlodipine besylate) .... Take one daily 8)  Tramadol Hcl 50 Mg Tabs (Tramadol hcl) .... One by mouth q 6 hours as needed for pain  Other Orders: Neurology Referral (Neuro)  Patient Instructions: 1)  I will send in your sleep mask request today.  That should happen soon. 2)  Please come back in one month to see how you are doing. 3)  I will resend the neurologist appt and we will call you. 4)  I  am refilling your tramadol. Prescriptions: TRAMADOL HCL 50 MG  TABS (TRAMADOL HCL) one by mouth q 6 hours as needed for pain  #30 x 1   Entered and Authorized by:   Ellery Plunk MD   Signed by:   Ellery Plunk MD on 05/06/2010   Method used:   [facility] to        [facility] [address] [phone]* (retail)       [address]       [place], [place]  [address]       Ph: [phone] or [phone]       Fax: [phone]   RxID:   [id number]    Orders Added: 1)  Neurology Referral [Neuro] 2)  DME Referral [DME] 3)  [facility]- Est Level  3 [[address]]

## 2010-08-07 NOTE — Assessment & Plan Note (Signed)
Summary: F/U/KH   Vital Signs:  Patient profile:   50 year old female Weight:      226.0 pounds Temp:     98.3 degrees F Pulse rate:   76 / minute BP sitting:   156 / 93  (left arm)  Vitals Entered By: Starleen Blue RN (December 06, 2009 8:40 AM)  Primary Care Provider:  Alvia Grove DO  CC:  hypertension.  History of Present Illness: HTN-  taking all meds that she was d/cd from hospital on.  on problems, no HA, no trouble affording meds.  reports feeling better with better BPs.    Dizziness- had a small recurrence of dizziness today with some nausea, but not affecting her activites.  Current Medications (verified): 1)  Reglan 10 Mg Tabs (Metoclopramide Hcl) .... Take One 30 Min Before Meals and At Bedtime 2)  Promethazine Hcl 25 Mg Supp (Promethazine Hcl) .... Take One Every 6 Hours Per Rectum 3)  Antivert 12.5 Mg Tabs (Meclizine Hcl) .... Take One Two Times A Day When You Feel Dizzy 4)  Lisinopril 20 Mg Tabs (Lisinopril) .... Take One Daily 5)  Zocor 40 Mg Tabs (Simvastatin) .... Take One At Bedtime 6)  Metoprolol Tartrate 25 Mg Tabs (Metoprolol Tartrate) .... Take One Half Tab Two Times A Day 7)  Hydrochlorothiazide 25 Mg Tabs (Hydrochlorothiazide) .... Taek One Daily 8)  Hydralazine Hcl 50 Mg Tabs (Hydralazine Hcl) .... Take One Three Times A Day 9)  Norvasc 10 Mg Tabs (Amlodipine Besylate) .... Take One Daily  Allergies (verified): 1)  * Contrast Dye  Past History:  Past Medical History: Chiari I malformation decompressed 2002 Cardiac cath 2007- normal coronary arteries hiatal hernia CVA x 2 2008 HTN HLD multiple TIAs Hx of migraine nuclear medicine stress test negative with EF 55% on 11/2009  Review of Systems       complaining of slight dizziness and intermittent nausea, otherwise negative  Physical Exam  General:  Well-developed,well-nourished,in no acute distress; alert,appropriate and cooperative throughout examination obese Lungs:  Normal respiratory  effort, chest expands symmetrically. Lungs are clear to auscultation, no crackles or wheezes. Heart:  Normal rate and regular rhythm. S1 and S2 normal without gallop, murmur, click, rub or other extra sounds. Abdomen:  exam limited by central obesity   Impression & Recommendations:  Problem # 1:  VERTIGO (ICD-780.4) Assessment Unchanged  still having problems with this.  neurology referral following through with course from before hospitalization.   Her updated medication list for this problem includes:    Promethazine Hcl 25 Mg Supp (Promethazine hcl) .Marland Kitchen... Take one every 6 hours per rectum    Antivert 12.5 Mg Tabs (Meclizine hcl) .Marland Kitchen... Take one two times a day when you feel dizzy  Orders: Neurology Referral (Neuro) [facility]- Est Level  3 (81191)  Her updated medication list for this problem includes:    Promethazine Hcl 25 Mg Supp (Promethazine hcl) .Marland Kitchen... Take one every 6 hours per rectum    Antivert 12.5 Mg Tabs (Meclizine hcl) .Marland Kitchen... Take one two times a day when you feel dizzy  Problem # 2:  ESSENTIAL HYPERTENSION, BENIGN (ICD-401.1) Assessment: Improved  improved from last visit.  increased lisinopril.  nurse visit in two weeks, could continue to push lisinopril or another medication.  asked her to see Dr. Gerilyn Pilgrim to see if nutritional changes could help.  discussed physical activity Her updated medication list for this problem includes:    Lisinopril 20 Mg Tabs (Lisinopril) .Marland Kitchen... Take one daily  Metoprolol Tartrate 25 Mg Tabs (Metoprolol tartrate) .Marland Kitchen... Take one half tab two times a day    Hydrochlorothiazide 25 Mg Tabs (Hydrochlorothiazide) .Marland Kitchen... Taek one daily    Hydralazine Hcl 50 Mg Tabs (Hydralazine hcl) .Marland Kitchen... Take one three times a day    Norvasc 10 Mg Tabs (Amlodipine besylate) .Marland Kitchen... Take one daily    Her updated medication list for this problem includes:    Lisinopril 20 Mg Tabs (Lisinopril) .Marland Kitchen... Take one daily    Metoprolol Tartrate 25 Mg Tabs (Metoprolol tartrate)  .Marland Kitchen... Take one half tab two times a day    Hydrochlorothiazide 25 Mg Tabs (Hydrochlorothiazide) .Marland Kitchen... Taek one daily    Hydralazine Hcl 50 Mg Tabs (Hydralazine hcl) .Marland Kitchen... Take one three times a day    Norvasc 10 Mg Tabs (Amlodipine besylate) .Marland Kitchen... Take one daily  Orders: [facility]- Est Level  3 (22025)  Complete Medication List: 1)  Reglan 10 Mg Tabs (Metoclopramide hcl) .... Take one 30 min before meals and at bedtime 2)  Promethazine Hcl 25 Mg Supp (Promethazine hcl) .... Take one every 6 hours per rectum 3)  Antivert 12.5 Mg Tabs (Meclizine hcl) .... Take one two times a day when you feel dizzy 4)  Lisinopril 20 Mg Tabs (Lisinopril) .... Take one daily 5)  Zocor 40 Mg Tabs (Simvastatin) .... Take one at bedtime 6)  Metoprolol Tartrate 25 Mg Tabs (Metoprolol tartrate) .... Take one half tab two times a day 7)  Hydrochlorothiazide 25 Mg Tabs (Hydrochlorothiazide) .... Taek one daily 8)  Hydralazine Hcl 50 Mg Tabs (Hydralazine hcl) .... Take one three times a day 9)  Norvasc 10 Mg Tabs (Amlodipine besylate) .... Take one daily  Patient Instructions: 1)  it was nice to see you again.  Im glad you are better. 2)  We are changing the lisinipril to 20mg .  You can take 2 of the 10s until you run out. 3)  We will help you make a neurology appt.   4)  Keep walking, increase 30 mins total per day.  Play with your dogs.  5)  Make an appt to see Dr. Gerilyn Pilgrim (nutrition) at the front desk. 6)  Make a nurse appt for 2 weeks and see me in one month.   Prescriptions: NORVASC 10 MG TABS (AMLODIPINE BESYLATE) take one daily  #30 x 3   Entered and Authorized by:   Ellery Plunk MD   Signed by:   Starleen Blue RN on 12/06/2009   Method used:   Historical   RxID:   [id number] HYDRALAZINE HCL 50 MG TABS (HYDRALAZINE HCL) take one three times a day  #90 x 3   Entered and Authorized by:   Ellery Plunk MD   Signed by:   Starleen Blue RN on 12/06/2009   Method used:   Historical   RxID:    [id number] HYDROCHLOROTHIAZIDE 25 MG TABS (HYDROCHLOROTHIAZIDE) taek one daily  #30 x 3   Entered and Authorized by:   Ellery Plunk MD   Signed by:   Starleen Blue RN on 12/06/2009   Method used:   Historical   RxID:   [id number] METOPROLOL TARTRATE 25 MG TABS (METOPROLOL TARTRATE) take one half tab two times a day  #15 x 3   Entered and Authorized by:   Ellery Plunk MD   Signed by:   Starleen Blue RN on 12/06/2009   Method used:   Historical   RxID:   [id number] LISINOPRIL 20 MG TABS (LISINOPRIL) take one daily  #30  x 3   Entered and Authorized by:   Ellery Plunk MD   Signed by:   Ellery Plunk MD on 12/06/2009   Method used:   Electronically to        [facility] [phone]* (retail)       [address]       [place], [place]  [address]       Ph: [phone]       Fax: [phone]   RxID:   [id number]

## 2010-08-07 NOTE — Assessment & Plan Note (Signed)
Summary: leg cramps/[place]/spiegel   Vital Signs:  Patient profile:   50 year old female Weight:      219.1 pounds Temp:     98.3 degrees F oral BP sitting:   210 / 125  Vitals Entered By: Jimmy Footman, CMA (July 21, 2010 1:40 PM)  Serial Vital Signs/Assessments:  Time      Position  BP       Pulse  Resp  Temp     By                     200/120                        Renold Don MD                     202/120                        Renold Don MD  CC: lower back & leg pain.  Pain Assessment Patient in pain? yes     Location: leg and back Intensity: 10 Type: sharp   Primary Care Provider:  Ellery Plunk MD  CC:  lower back & leg pain. Marland Kitchen  History of Present Illness: 1.  Pain:  Patient came in today complaining of back pain radiating to legs for past 3 weeks.  Describes them as leg cramps. but on further questioning pain seens to be almost neuropathic in nature.  Describes as burning and tingling that keeps her up at night.  Not relieved with Tramadol.  [facility] BM[facility] at [facility].    While attempting to check in patient, could not register blood pressure on machine.  Manual rechecks showed pressures of 200/120, both arms, separated by about 30 minutes in time.  Patient admits to pre-syncopal symptoms for past week.  Of note, she is on multiple blood pressure medications, states that she takes them regularly including today.  Uses CPAP, but unable to use regularly for past several nights secondary to getting out of bed with pain.    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 Take One 30 Min Before Meals and At Bedtime 2)  Lisinopril 20 Mg Tabs (Lisinopril) .... Take One Daily 3)  Zocor 40 Mg Tabs (Simvastatin) .... Take One At Bedtime 4)  Metoprolol Succinate 50 Mg Xr24h-Tab (Metoprolol Succinate) .... One By Mouth Daily 5)  Hydrochlorothiazide 25 Mg Tabs (Hydrochlorothiazide) .... Taek One Daily 6)  Hydralazine Hcl 50 Mg Tabs (Hydralazine Hcl) .... Take One Three Times A Day 7)  Norvasc 10 Mg Tabs (Amlodipine Besylate) .... Take One Daily 8)  Tramadol Hcl 50 Mg  Tabs (Tramadol Hcl) .... One By Mouth  Q 6 Hours As Needed For Pain  Allergies (verified): 1)  * Contrast Dye  Past History:  Past medical, surgical, family and social histories (including risk factors) reviewed, and no changes noted (except as noted below).  Past Medical History: Reviewed history from 12/06/2009 and no changes required. Chiari I malformation decompressed 2002 Cardiac cath 2007- normal coronary arteries hiatal hernia CVA x 2 2008 HTN HLD multiple TIAs Hx of migraine nuclear medicine stress test negative with EF 55% on 11/2009  Past Surgical History: Reviewed history from 10/18/2009 and no changes required. Chiari I malformation decompressed 2002 3 C sections BTL c1 laminectomy B breast reduction  Family History: Reviewed history from 11/21/2009 and no changes required. mother CAD, CVA, died at 57 of MI Dad: living, uses O2 b/c of burn affecting lungs 9 siblings: all have HTN  Social History: Reviewed history from 11/21/2009 and no changes required. lives with daughter, son and brother, no alcohol , tobacco (never smoked), or  illicit drugs  Review of Systems       ROS:  No chest pain, palpitations, shortness of breath or dyspnea, abdominal pain, diarrhea or constipation, melena, hematochezia, lower extremity swelling.  See HPI for other ROS   Physical Exam  General:  Vital signs reviewed. Well-developed, well-nourished patient in NAD.  Awake and cooperative  Head:  normocephalic and atraumatic.   Eyes:  PERRL.  Fundoscopy exam technically difficult, could not appreciate cup to disc ratio.  EOMI Nose:  no bleeding, nares clear Neck:  supple with good ROM Lungs:  clear to auscultation bilaterally without wheezing, rales, or rhonchi.  Normal work of breathing  Heart:  Regular rate and rhythm without murmur, rub, or gallop.  Normal S1/S2  Abdomen:  soft/nondistended/nontender.  Obese.  Bowel sounds present and normoactive.  No organomegaly noted.   Msk:  Pain on palpation of lumbosacral region.  No bony tenderness.  No joint swelling noted BL LE's.  Pulses:  good pulses BL upper and lower extremities Extremities:  No clubbing, cyanosis, edema, or deformity noted with normal full range of motion of all joints.  No redness, warmth, edema BL LE's.   Neurologic:  Full ROM BL LE's.  CN II-XII intact.  No motor or sensory deficits noted BL upper or lower extremities.  Gait exam WNL.  DTRs symmetrical and normal and finger-to-nose normal.     Impression & Recommendations:  Problem # 1:  Hypertensive Urgency Patient with symptoms of lightheadedness.  Likely secondary to lack of CPAP use at night as patient not wearing CPAP due to back pain.  Unclear compliance as patient has history of urgency in past on further review of records.  States she is taking all of her medications, however she could not name any of them or how often she is supposed to be taking them.  Did not know total number of blood pressure medications she is taking, guessed 2 or 3.  EKG obtained and WNL.  No abdominal pain, no LE swelling.  Patient  given 0.2 mg Clonidine in clinic without inital change in blood pressures.   Sent to ED for further observation.  Precepted case with Dr. Tressia Danas who agreed with plan.   Problem # 2:  Back pain Likely contributing to symptoms, however no pain demonstrated in clinic today.  Bigger concern as mentioned above was high blodo presure  Problem # 3:  ARNOLD-CHIARI MALFORMATION (ICD-741.00) Concern for increased intracranial pressure as patient has history of this and presents in HTN urgency.  No  neurological deficits on exam.  B/c of HTN and history of this as well as CVAs, reason for observation in ED.    Complete Medication List: 1)  Reglan 10 Mg Tabs (Metoclopramide hcl) .... Take one 30 min before meals and at bedtime 2)  Lisinopril 20 Mg Tabs (Lisinopril) .... Take one daily 3)  Zocor 40 Mg Tabs (Simvastatin) .... Take one at bedtime 4)  Metoprolol Succinate 50 Mg Xr24h-tab (Metoprolol succinate) .... One by mouth daily 5)  Hydrochlorothiazide 25 Mg Tabs (Hydrochlorothiazide) .... Taek one daily 6)  Hydralazine Hcl 50 Mg Tabs (Hydralazine hcl) .... Take one three times a day 7)  Norvasc 10 Mg Tabs (Amlodipine besylate) .... Take one daily 8)  Tramadol Hcl 50 Mg Tabs (Tramadol hcl) .... One by mouth q 6 hours as needed for pain  Other Orders: EKG- [facility] (EKG)   Medication Administration  Medication # 1:    Medication: Clonidine 0.2mg  tab    Diagnosis: ESSENTIAL HYPERTENSION, BENIGN (ICD-401.1)    Dose: 1 tablet    Route: po    Exp Date: 09/2010    Lot #: [id number]    Mfr: udl    Patient tolerated medication without complications    Given by: Jimmy Footman, CMA (July 21, 2010 6:09 PM)  Orders Added: 1)  EKG- [facility] [EKG]     Appended Document: leg cramps/[place]/spiegel Patient sent home from ED.  After time, her pressures came back down to 130s systolic.  Likely secondary to Clonidine.  FU appt made for Wednesday from ED.    Appended Document: added orders    Clinical Lists  Changes  Orders: Added new Test order of [facility]- Est Level  5 (98119) - Signed

## 2010-08-07 NOTE — Initial Assessments (Signed)
Summary: Hospital H & P   Vital Signs:  Patient profile:   50 year old female O2 Sat:      98 % on 2 L/min Temp:     98.4 degrees F Pulse rate:   81 / minute Resp:     20 per minute BP supine:   147 / 92  O2 Flow:  2 L/min  Primary Care Provider:  Alvia Grove DO  CC:  chest pain.  History of Present Illness: 50 y/o pt comes in with chest pain that started at 10:00 am this morning. She was seen in the [facility] this morning and noted to have elevated BP (163/109). She ws given ASA 325mg  x1 and Clonidine 0.2 mg x 1. She had an EKG that reportedly did not show STEMI or ST segment depression. Pt was sent over the ED by ambulance where she was found to have a BP of 210/110 given Nitro SL x1 and her BP came down to 161/109. In the ED she was given Nitro SL again and given Dilaudid to help with the chest pain. She was also started on NS at 125 cc/hr. She was feeling sleepy when I saw her but was arousable. Pt says she has been feeling weak since she had her 2 strokes in 2008. She has felt more fatigued in the last 3 days. Today the chest pain was like the pressure of "someone standing on my chest". SHe has no radiation but she has had a cough. The chest pain comes and goes lasting 20 minutes when it does come. It is not related to exertion but she did get short of breath today when she was stirring around although that is not what brought on the chest pain. The location is under her breast. It feels like the last time she had chest pain. She has had several hospitalizations for this in the last yea. She had a TSH and FLP on 10/01/09 that were normal. She has a positive family history for premature CAD, she has a h/o HLD and hypertension. She does not smoke, she has had a negative cath in 2007.    Habits & Providers  Alcohol-Tobacco-Diet     Alcohol drinks/day: 0     Tobacco Status: never  Allergies: 1)  * Contrast Dye  Past History:  Past Surgical History: Last updated: 10/18/2009 Chiari I  malformation decompressed 2002 3 C sections BTL c1 laminectomy B breast reduction  Past Medical History: Chiari I malformation decompressed 2002 Cardiac cath 2007- normal coronary arteries hiatal hernia CVA x 2 2008 HTN HLD multiple TIAs Hx of migraine  Family History: mother CAD, CVA, died at 45 of MI Dad: living, uses O2 b/c of burn affecting lungs 9 siblings: all have HTN  Social History: lives with daughter, son and brother, no alcohol , tobacco (never smoked), or illicit drugs  Review of Systems        vitals reviewed and pertinent negatives and positives seen in HPI   Physical Exam  General:  obese, in no acute distress; alert,appropriate and cooperative throughout examination Head:  Normocephalic and atraumatic without obvious abnormalities. No apparent alopecia or balding. Eyes:  No corneal or conjunctival inflammation noted. EOMI. [place]. Vision grossly normal. Ears:  External ear exam shows no significant lesions or deformities.  Otoscopic examination reveals clear canals, tympanic membranes are intact bilaterally without bulging, retraction, inflammation or discharge. Hearing is grossly normal bilaterally. Nose:  External nasal examination shows no deformity or inflammation. Nasal mucosa are pink  and moist without lesions or exudates. Mouth:  Oral mucosa and oropharynx without lesions or exudates.  Teeth in good repair. Neck:  No deformities, masses, or tenderness noted. Lungs:  Normal respiratory effort, chest expands symmetrically. Lungs have minor wheezes but no focal findings.  Heart:  Normal rate and regular rhythm. S1 and S2 normal without gallop, murmur, click, rub or other extra sounds. slightly difficult to hear b/c of body habitus Abdomen:  obese, non-tender, normal bowel sounds, no distention, and no masses.   Msk:  normal ROM, no joint tenderness, and no joint swelling.   Extremities:  no edema Neurologic:  No cranial nerve deficits noted. Sensory,  motor and coordinative functions appear intact. strength is equal throughout but left arm not assessed b/c of position of IV.  Skin:  Intact without suspicious lesions or rashes Psych:  Cognition and judgment appear intact. sleepy but arousable and cooperative with normal attention span and concentration. No apparent delusions, illusions, hallucinations   Complete Medication List: 1)  Reglan 10 Mg Tabs (Metoclopramide hcl) .... Take one 30 min before meals and at bedtime 2)  Promethazine Hcl 25 Mg Supp (Promethazine hcl) .... Take one every 6 hours per rectum 3)  Antivert 12.5 Mg Tabs (Meclizine hcl) .... Take one two times a day when you feel dizzy 4)  Lisinopril 10 Mg Tabs (Lisinopril) .... Take one daily 5)  Zocor 40 Mg Tabs (Simvastatin) .... Take one at bedtime 6)  Atenolol 50 Mg Tabs (Atenolol)    Labs: Trop 0.01, CK 68, CK-MB 0.8 Na 138, K 3.8, Cl 107, HCO3 24, BUN 8, Cr 1.21, Glu 99  Ca 8.8 WBC 9.1, Hg 10.5, HCT 31.7, Plt 327 UDS pending UA pending  Studies: CXR: Cardiomegaly, no active dz, no change since 09-30-09  A/P: 50 y/o F with chest pain comes in for chest pain rule out.  1: Chest pain: Plan to rule the patient with 2 more sets of CE q8 hours. Plan to put the patient on the tele floor, repeat EKG in the morning. Do not plan to repeat FLP or TSH that were done 2 months ago. Pt has not had a cath since 2007. Will consider getting cards consult to consider cath. May need exercise threadmill test or myoview as outpatient. Pt has been ruled out for MI about 3 times already this year but because she has significant risk factors she will stay to get ruled out again.  2: hypertension:Pt has a h/o hypertension and presented to the clinic today with elevated BP's. Plan to put her back on her home meds and will plan to increase Atenolol to 100 mg qday if BP uncontrolled. 3: Depression:Suspect the patient has depression and that is part of her sleepiness and fatigue. She says she has  felt this way since she had her stroke. She says she was on Zoloft but I don't see it on her med rec. Will restart Zoloft at this time. 4: HLD: Pt will continue on her home dose of Zocor as she is getting good results from this with a Chol 155, LDL 96, HDL 36 on 3/39/11.  5: Nausea/Vomiting: Pt has not vomited in 1 week but was recently treated with Phenergan supp and Meclizine. Will cont home doses. Will cont reglan for possible gastroparesis.  5: Prophy: Heparin 5000 units Subcutaneously TId 6: FEN/GI: heart healthy diet, IVF at 100 cc/hr 7: Dispo: Pt will likely go home tomorrow if she rules out.

## 2010-08-07 NOTE — Assessment & Plan Note (Signed)
Summary: shortness of breath/ls   Vital Signs:  Patient profile:   50 year old female Weight:      236.3 pounds O2 Sat:      98 % on Room air Temp:     98.5 degrees F oral Pulse rate:   91 / minute Pulse rhythm:   regular BP sitting:   165 / 104  (left arm) Cuff size:   large  Vitals Entered By: Loralee Pacas CMA (May 14, 2010 1:46 PM)  O2 Flow:  Room air CC: sob   Primary Care Provider:  Ellery Plunk MD  CC:  sob.  History of Present Illness: 50 yo here for work in for SOB  4 days of CPAP, has been tolerating well but she associates start of CPAS with onset of shortness of breath with exertion int he morning with distances a shot as going to the bathroom.  + cough, nonproductive, non smoker. + "heart flurry"  No fever, chest pain, abd pain.  Episodes resolve soon after resting.  No PND, LE edema.  Of note- patient states her father is currenlty in an ICU and is not expected to survive.  Habits & Providers  Alcohol-Tobacco-Diet     Alcohol drinks/day: 0     Tobacco Status: never  Current Medications (verified): 1)  Reglan 10 Mg Tabs (Metoclopramide Hcl) .... Take One 30 Min Before Meals and At Bedtime 2)  Lisinopril 20 Mg Tabs (Lisinopril) .... Take One Daily 3)  Zocor 40 Mg Tabs (Simvastatin) .... Take One At Bedtime 4)  Metoprolol Succinate 50 Mg Xr24h-Tab (Metoprolol Succinate) .... One By Mouth Daily 5)  Hydrochlorothiazide 25 Mg Tabs (Hydrochlorothiazide) .... Taek One Daily 6)  Hydralazine Hcl 50 Mg Tabs (Hydralazine Hcl) .... Take One Three Times A Day 7)  Norvasc 10 Mg Tabs (Amlodipine Besylate) .... Take One Daily 8)  Tramadol Hcl 50 Mg  Tabs (Tramadol Hcl) .... One By Mouth Q 6 Hours As Needed For Pain  Allergies: 1)  * Contrast Dye PMH-FH-SH reviewed for relevance  Review of Systems      See HPI  Physical Exam  General:  VS reviewed, obese, well-developed, well-nourished, and well-hydrated.   Neck:  No lymphadenopathy or JVD. Full ROM. Lungs:   Normal respiratory effort, chest expands symmetrically. Lungs are clear to auscultation, no crackles or wheezes. Heart:  Normal rate and regular rhythm. S1 and S2 normal without gallop, murmur, click, rub or other extra sounds. Extremities:  no edema  Psych:  Oriented X3, memory intact for recent and remote, and normally interactive.   Additional Exam:  Ambulatory pulse ox- patient became dyspneic, pulse ox 94% EKG:  NSR  Rate 84.  No significant change from EKG 02-17-2010   Impression & Recommendations:  Problem # 1:  DYSPNEA ON EXERTION (ICD-786.09) Reassured patient I do not think this is due to her CPAP and encouraged continued use.  No evidence of hypoxia, arrythmia, or CHF.  Given acute stressor as patient has just come from observing her father dying in the ICU, likely etiology.  Discussed with patient getting CXR today and monitoring symptoms closely- would have low threshold for going to ER to further evaluate her symptoms given medcal comorbidities.    Her updated medication list for this problem includes:    Lisinopril 20 Mg Tabs (Lisinopril) .Marland Kitchen... Take one daily    Metoprolol Succinate 50 Mg Xr24h-tab (Metoprolol succinate) ..... One by mouth daily    Hydrochlorothiazide 25 Mg Tabs (Hydrochlorothiazide) .Marland Kitchen... Taek one  daily  Orders: CXR- 2view (CXR) [facility]- Est  Level 4 (04540)  Problem # 2:  ESSENTIAL HYPERTENSION, BENIGN (ICD-401.1)  poorly controlled, on 5 agents.  patient aware.  Not significanlty different from her baseline.  Gven acute stressors,will not make changes today.  Her updated medication list for this problem includes:    Lisinopril 20 Mg Tabs (Lisinopril) .Marland Kitchen... Take one daily    Metoprolol Succinate 50 Mg Xr24h-tab (Metoprolol succinate) ..... One by mouth daily    Hydrochlorothiazide 25 Mg Tabs (Hydrochlorothiazide) .Marland Kitchen... Taek one daily    Hydralazine Hcl 50 Mg Tabs (Hydralazine hcl) .Marland Kitchen... Take one three times a day    Norvasc 10 Mg Tabs (Amlodipine besylate)  .Marland Kitchen... Take one daily  Orders: [facility]- Est  Level 4 (98119)  Problem # 3:  SLEEP APNEA (ICD-780.57) tolerating CPAP well.  Reassured patient I do not think this is due to her CPAP and encouraged continued use.  Orders: [facility]- Est  Level 4 (14782)  Complete Medication List: 1)  Reglan 10 Mg Tabs (Metoclopramide hcl) .... Take one 30 min before meals and at bedtime 2)  Lisinopril 20 Mg Tabs (Lisinopril) .... Take one daily 3)  Zocor 40 Mg Tabs (Simvastatin) .... Take one at bedtime 4)  Metoprolol Succinate 50 Mg Xr24h-tab (Metoprolol succinate) .... One by mouth daily 5)  Hydrochlorothiazide 25 Mg Tabs (Hydrochlorothiazide) .... Taek one daily 6)  Hydralazine Hcl 50 Mg Tabs (Hydralazine hcl) .... Take one three times a day 7)  Norvasc 10 Mg Tabs (Amlodipine besylate) .... Take one daily 8)  Tramadol Hcl 50 Mg Tabs (Tramadol hcl) .... One by mouth q 6 hours as needed for pain  Patient Instructions: 1)  Will get a chest xray 2)  I will call you if results are abnormal 3)  if you have worsening symptoms, please go to ER 4)  Follow-up with your PCP in the next 1-2 weeks   Orders Added: 1)  CXR- 2view [CXR] 2)  [facility]- Est  Level 4 [95621]  Appended Document: shortness of breath/ls

## 2010-08-07 NOTE — Progress Notes (Signed)
Summary: phn msg  Phone Note Call from Patient Call back at [facility] Phone [phone]   Caller: Patient Summary of Call: pt get SOB after she takes the Cpap and wants to know what she can take to help that? Initial call taken by: De Nurse,  May 14, 2010 10:41 AM  Follow-up for Phone Call        Returned call, no answer.  [facility] to call us back. Follow-up by: Dennison Nancy RN,  May 14, 2010 11:14 AM  Additional Follow-up for Phone Call Additional follow up Details #1::        pt returned call Additional Follow-up by: De Nurse,  May 14, 2010 11:48 AM    Additional Follow-up for Phone Call Additional follow up Details #2::    patient states she she started Cpap on Friday 05/09/2010. she is experincing shortness of breath after she wakes in the morning and starts moving around. this has been going on since Friday.  she is short of breath now she states.  has a history of SOB with bronchitis in past when weather changes.  she is on her way to see her father who is dying in the hospital she states . advised to come to office at 1:30 for work in appointment. Follow-up by: [name] RN,  May 14, 2010 12:10 PM

## 2010-08-07 NOTE — Initial Assessments (Signed)
History and Physical  Vital Signs:  Patient profile:   50 year old female O2 Sat:      96 % on Room air Temp:     98.0 degrees F oral Pulse rate:   77 / minute Resp:     24 per minute BP supine:   160 / 104  Primary Provider:  Alvia Grove DO   History of Present Illness: CC: Vomiting and headache  Pt. says that yesterday she went to church and felt hot and had a headache.  She threw up, took tylenol for the headache, and then went to sleep, and slept from about 4 pm to 11 am.  When she woke up today, she still had a headache, she had pain and blurry vision in her right eye, felt short of breath, and started vomiting.  She could not keep any of her medicines down, or any fluids or food.  She says she feels like her head is going to burst, rates it a 10/10 pain, right sided.  She called the [facility] and she could not be seen today, so they advised she come to the ED.  Lindsey [name] reports that she  has been having vomiting for two weeks.  She says this has been her baseline since her strokes in 2008.  She has vomiting 3 weeks out of every month, about three times a day.  She says the last time she had a headache was about a month ago, but she has never had anything like this before.  ROS: pt. denies fever/chills, diarrhea/constipation, hematemesis, dizzyness, lightheadedness, vertigo, vaginal itching or discharge.  Pt. endorses a poor appetite, however she reports she has gained about 100 lbs since her stroke in 2008.  She also says she has had some bladder pressure, but was recently tx for UTI.   Allergies: 1)  * Contrast Dye  Past History:  Past Medical History: Last updated: 12/06/2009 Chiari I malformation decompressed 2002 Cardiac cath 2007- normal coronary arteries hiatal hernia CVA x 2 2008 HTN HLD multiple TIAs Hx of migraine nuclear medicine stress test negative with EF 55% on 11/2009  Past Surgical History: Last updated: 10/18/2009 Chiari I malformation decompressed  2002 3 C sections BTL c1 laminectomy B breast reduction  Family History: Last updated: 11/30/2009 mother CAD, CVA, died at 23 of MI Dad: living, uses O2 b/c of burn affecting lungs 9 siblings: all have HTN  Social History: Last updated: 2009-11-30 lives with daughter, son and brother, no alcohol , tobacco (never smoked), or illicit drugs  Physical Exam  General:  Well-developed,well-nourished,in no acute distress; alert,appropriate and cooperative throughout examination Head:  Normocephalic and atraumatic without obvious abnormalities. No apparent alopecia or balding. Eyes:  No corneal or conjunctival inflammation noted. EOMI. Perrla. Vision grossly normal. Nose:  External nasal examination shows no deformity or inflammation. Nasal mucosa are pink and moist without lesions or exudates. Mouth:  Oral mucosa and oropharynx without lesions or exudates.  Teeth in good repair. Neck:  No deformities, masses, or tenderness noted. Lungs:  Normal respiratory effort, chest expands symmetrically. Lungs are clear to auscultation, no crackles or wheezes. Heart:  Distant heart sounds. Normal rate and regular rhythm.  Abdomen:  Obese, soft, diffuse tenderness to palpation, + bs Neurologic:  alert & oriented X3 and cranial nerves II-XII intact, except CN V, 2, pt endorses sensation different in lower face.     Complete Medication List: 1)  Reglan 10 Mg Tabs (Metoclopramide hcl) .... Take one 30 min before  meals and at bedtime 2)  Promethazine Hcl 25 Mg Supp (Promethazine hcl) .... Take one every 6 hours per rectum 3)  Antivert 12.5 Mg Tabs (Meclizine hcl) .... Take one two times a day when you feel dizzy 4)  Lisinopril 20 Mg Tabs (Lisinopril) .... Take one daily 5)  Zocor 40 Mg Tabs (Simvastatin) .... Take one at bedtime 6)  Metoprolol Tartrate 25 Mg Tabs (Metoprolol tartrate) .... Take one half tab two times a day 7)  Hydrochlorothiazide 25 Mg Tabs (Hydrochlorothiazide) .... Taek one daily 8)   Hydralazine Hcl 50 Mg Tabs (Hydralazine hcl) .... Take one three times a day 9)  Norvasc 10 Mg Tabs (Amlodipine besylate) .... Take one daily 10)  Promethazine Hcl 25 Mg Tabs (Promethazine hcl) .... One tab by mouth q6hrs as needed emesis (patient also has suppositories available)  ED Course: Pt. was hypertensive to 193/118 upon arrival to the ED.  She was given Labetolol 20 mg IV push, and Compazine 5 mg IV push.  Her blood pressure improved, but her nausea and headache did not.   Labs:  Chem 8:  TCO2                                     24                0-100            mmol/L  Ionized Calcium                          1.08       l      1.12-1.32        mmol/L  Hemoglobin (HGB)                         12.6              12.0-15.0        g/dL  Hematocrit (HCT)                         37.0              36.0-46.0        %  Sodium (NA)                              139               135-145          mEq/L  Potassium (K)                            4.0               3.5-5.1          mEq/L  Chloride                                 106               96-112           mEq/L  Glucose  126        h      70-99            mg/dL  BUN                                      10                6-23             mg/dL  Creatinine                               1.5        h      0.4-1.2          mg/dL  CBC: Perf. Loc.  WBC                                      9.4               4.0-10.5         K/uL  RBC                                      4.23              3.87-5.11        MIL/uL  Hemoglobin (HGB)                         11.6       l      12.0-15.0        g/dL  Hematocrit (HCT)                         33.5       l      36.0-46.0        %  MCV                                      79.2              78.0-100.0       fL  MCH -                                    27.4              26.0-34.0        pg  MCHC                                     34.6              30.0-36.0        g/dL  RDW  17.2       h      11.5-15.5        %  Platelet Count (PLT)                     338               150-400          K/uL  Neutrophils, %                           47                43-77            %  Lymphocytes, %                           43                12-46            %  Monocytes, %                             6                 3-12             %  Eosinophils, %                           5                 0-5              %  Basophils, %                             0                 0-1              %  Neutrophils, Absolute                    4.4               1.7-7.7          K/uL  Lymphocytes, Absolute                    4.0               0.7-4.0          K/uL  Monocytes, Absolute                      0.5               0.1-1.0          K/uL  Eosinophils, Absolute                    0.4               0.0-0.7          K/uL  Basophils, Absolute                      0.0  0.0-0.1          K/uL  Cardiac Markers: CKMB, POC                                <1.0       l      1.0-8.0          ng/mL  Troponin I, POC                          <0.05             0.00-0.09        ng/mL  Myoglobin, POC                           57.9              12-200           ng/mL  Radiology: CT head:  No acute intracranial abnormality. CXR: Cardiomegaly without edema.  A/P: 50 yo AAF with PMHx of CVA, HTN, HLD, Chiari I malformation who presents with severe headache and recurrent vomiting: 1) Headache- MRI brain to evaluate for neurologic event.  Morpine 2 mg q 4 hr as needed pain. Tylenol 650 as needed pain 2) Nausea/vomiting- will give zofran 8mg  IV q6 hr as needed.  Will order CMP, Amylase, Lipase, UA and Urine Cx.  3) HTN- pt was unable to take any of her HTN meds today, and this is most likely the cause of her blood pressure elevation upon arrival to the ED. restart Metoprolol and Norvasc.  Hydralazine as needed. 4) Hx of CVA- ASA 325 daily.  5) Acute Renal  failure- Cr in ED 1.5, baseline 1.2, most likely due to dehydration.  Will hydrate and follow.   6) HLD- check lipid panel, restart statin when pt better tolerates by mouth.  7) FEN/GI- NS @ 125 cc/hr.  NPO for now.  8) PPX- Heparin 5000 units Subcutaneously three times a day.  9) Dispo- pending improvement of n/v and headache.

## 2010-08-07 NOTE — Assessment & Plan Note (Signed)
Summary: h/a,df   Vital Signs:  Patient profile:   50 year old female Weight:      229 pounds Temp:     98.4 degrees F oral Pulse rate:   92 / minute BP sitting:   166 / 79  (left arm) Cuff size:   large  Vitals Entered By: Garen Grams LPN (March 13, 2010 2:54 PM) CC: headache with nausea and vomiting Is Patient Diabetic? No Pain Assessment Patient in pain? yes     Location: headache   Primary Care Kaori Jumper:  Ellery Plunk MD  CC:  headache with nausea and vomiting.  History of Present Illness:   1) Headache: Patient with history of complex migraine, Chiari type I malformation with worsening headache with nausea since this morning. Left sided from occiput to temporal. Pounding. Worse with palpation. Better with rest. Not relieved by Tylenol. Nausea with emesis x 1 this AM. Admitted with similar presentation in August 2011 with negative MRI with resolution of symptoms after IV morphine and antiemetics. Seen on 9/7 for asymptomatic (no headache at that time) hypertensive urgency (sent over from dentist's office) and for upper respiratory infection symptoms.   ROS: Denies focal neurological signs, vision change, photophobia, phonophobia, weakness, slurring speech, presyncope, sinus pain or pressure in itermin since last visit.    2) HTN: Blood pressure (166/79) is improved today as compared to last visit (200's /100's). Metoprolol was increased to 25 mg BID two days ago. Her BP was elevated while at dentist's office - sent for evaluation. Reports that she is taking all her medications but is unable to name any of them or state the schedule on which she takes them. She has stopped taking  the OTC cough medicine but is unable to remember the name, though she had initially stated that the box said that it was safe to use with HTN. Not exercising or watching salt intake.    ROS: Denies chest pain, dyspnea, LE edema, presyncope, nausea, emesis, vision change, urination change, focal  neurological signs, wheeze, diarrhea, sore throat, rash, lethargy / weakness in interim since last visit   unable to perform accurate med rec as patient unable to name medications   Habits & Providers  Alcohol-Tobacco-Diet     Alcohol drinks/day: 0     Tobacco Status: never  Current Medications (verified): 1)  Reglan 10 Mg Tabs (Metoclopramide Hcl) .... Take One 30 Min Before Meals and At Bedtime 2)  Promethazine Hcl 25 Mg Supp (Promethazine Hcl) .... Take One Every 6 Hours Per Rectum 3)  Antivert 12.5 Mg Tabs (Meclizine Hcl) .... Take One Two Times A Day When You Feel Dizzy 4)  Lisinopril 20 Mg Tabs (Lisinopril) .... Take One Daily 5)  Zocor 40 Mg Tabs (Simvastatin) .... Take One At Bedtime 6)  Metoprolol Tartrate 25 Mg Tabs (Metoprolol Tartrate) .... Take One Tab By Mouth Two Times A Day 7)  Hydrochlorothiazide 25 Mg Tabs (Hydrochlorothiazide) .... Taek One Daily 8)  Hydralazine Hcl 50 Mg Tabs (Hydralazine Hcl) .... Take One Three Times A Day 9)  Norvasc 10 Mg Tabs (Amlodipine Besylate) .... Take One Daily 10)  Promethazine Hcl 25 Mg Tabs (Promethazine Hcl) .... One Tab By Mouth Q6hrs As Needed Emesis (Patient Also Has Suppositories Available)  Allergies (verified): 1)  * Contrast Dye  Physical Exam  General:  VS reviewed, hypertensive, obese, NAD but appears uncomfortable  Head:  Normocephalic and atraumatic without obvious abnormalities. Tender to palpation left side head from occiput to parietal .  Eyes:  pupils equal, round and reactive to light , extraoccular movements intact , fundi w/o papilledema  Nose:  +congestion and rhinorrhea clear  Mouth:  moist membranes, mild posterior pharyngeal erythema w/o exudate or tonsillar hypertrophy Neck:  no lymphadenopathy or JVD  Lungs:  Normal respiratory effort, chest expands symmetrically. Lungs are clear to auscultation, no crackles or wheezes. Heart:  Normal rate and regular rhythm. S1 and S2 normal without gallop, murmur, click,  rub or other extra sounds. Abdomen:  obese, bowel sounds positive, abdomen soft and non-tender Msk:  5/5 strength bilateral upper and lower extremities at wrists, elbows, shoulders, hips, knees and ankles.  Pulses:  2+ radials  Extremities:  no edema  Neurologic:  alert & oriented X3, cranial nerves II-XII intact, strength normal in all extremities, sensation intact to light touch, and DTRs symmetrical and normal.     Impression & Recommendations:  Problem # 1:  HEADACHE (ICD-784.0) Assessment Deteriorated  With migrainous features, patient with history of complex migraine. Will give single dose Toradol today (reviewed renal function), but would avoid continued NSAID use given HTN. Phenergan at home. No red flags on history or exam. Recent MRI for similar presentation without acute change. Advised regarding red flags. BP improved as compared to yesterday - likley not hypertensive emergency. To follow with PCP as scheduled.   Orders: [facility]- Est  Level 4 (91478)  Problem # 2:  ESSENTIAL HYPERTENSION, BENIGN (ICD-401.1)  Her updated medication list for this problem includes:    Lisinopril 20 Mg Tabs (Lisinopril) .Marland Kitchen... Take one daily    Metoprolol Tartrate 25 Mg Tabs (Metoprolol tartrate) .Marland Kitchen... Take one tab by mouth two times a day    Hydrochlorothiazide 25 Mg Tabs (Hydrochlorothiazide) .Marland Kitchen... Taek one daily    Hydralazine Hcl 50 Mg Tabs (Hydralazine hcl) .Marland Kitchen... Take one three times a day    Norvasc 10 Mg Tabs (Amlodipine besylate) .Marland Kitchen... Take one daily  Improved as compared to yesterday. Poor control likely secondary to medication adherence issues, poor diet, lack of exercise. Also consider possibility that over the counter cough med may be cause; given improvement with patient stopping medicatin .Continue metoprolol at 25 mg by mouth two times a day as below. Follow up in 1-2 weeks with PCP. Would titrate meds accordingly if no improvement. Advised to bring in all medications to next visit (again  did not bring today, though I told her yesterday to bring in all meds to each visit). Not likely cause of headache since BP actually improved today.  BP today: 166/79 Prior BP: 202/128 (03/12/2010)  Orders: [facility]- Est  Level 4 (29562)  Complete Medication List: 1)  Reglan 10 Mg Tabs (Metoclopramide hcl) .... Take one 30 min before meals and at bedtime 2)  Promethazine Hcl 25 Mg Supp (Promethazine hcl) .... Take one every 6 hours per rectum 3)  Antivert 12.5 Mg Tabs (Meclizine hcl) .... Take one two times a day when you feel dizzy 4)  Lisinopril 20 Mg Tabs (Lisinopril) .... Take one daily 5)  Zocor 40 Mg Tabs (Simvastatin) .... Take one at bedtime 6)  Metoprolol Tartrate 25 Mg Tabs (Metoprolol tartrate) .... Take one tab by mouth two times a day 7)  Hydrochlorothiazide 25 Mg Tabs (Hydrochlorothiazide) .... Taek one daily 8)  Hydralazine Hcl 50 Mg Tabs (Hydralazine hcl) .... Take one three times a day 9)  Norvasc 10 Mg Tabs (Amlodipine besylate) .... Take one daily 10)  Promethazine Hcl 25 Mg Tabs (Promethazine hcl) .... One tab by mouth  q6hrs as needed emesis (patient also has suppositories available)  Patient Instructions: 1)  Take your promethazine at home as needed for nausea. 2)  Come in if your headache is still worsening or if you start to  experience any numbness, weakness or any other concerns.  3)  Follow up as scheduled with Dr. Hulen Luster.    Prevention & Chronic Care Immunizations   Influenza vaccine: Not documented    Tetanus booster: Not documented    Pneumococcal vaccine: Not documented  Other Screening   Pap smear: Not documented    Mammogram: Not documented   Smoking status: never  (03/13/2010)  Lipids   Total Cholesterol: Not documented   LDL: Not documented   LDL Direct: Not documented   HDL: Not documented   Triglycerides: Not documented    SGOT (AST): Not documented   SGPT (ALT): Not documented   Alkaline phosphatase: Not documented   Total  bilirubin: Not documented  Hypertension   Last Blood Pressure: 166 / 79  (03/13/2010)   Serum creatinine: Not documented   Serum potassium Not documented    Hypertension flowsheet reviewed?: Yes   Progress toward BP goal: Improved  Self-Management Support :   Personal Goals (by the next clinic visit) :      Personal blood pressure goal: 130/80  (03/13/2010)     Personal LDL goal: 70  (03/13/2010)    Hypertension self-management support: Written self-care plan  (03/13/2010)   Hypertension self-care plan printed.    Lipid self-management support: Not documented     Appended Document: h/a,df   Medication Administration  Injection # 1:    Medication: Ketorolac-Toradol 15mg     Diagnosis: HEADACHE (ICD-784.0)    Route: IM    Site: RUOQ gluteus    Exp Date: 05/07/2011    Lot #: [id number]    Mfr: Hospira    Comments: Patient recieved 30mg  of Toradol    Patient tolerated injection without complications    Given by: Garen Grams LPN (March 14, 2010 11:12 AM)  Orders Added: 1)  Ketorolac-Toradol 15mg  [[id number]]

## 2010-08-07 NOTE — Letter (Signed)
Summary: Generic Letter  [name]  [address]   [place], [place] [address]   Phone: [phone]  Fax: [phone]    03/12/2010  ALLYSHA TRYON [address] [place], [place]  [address]  To whom it may concern:  Patient seen and evaluated in office today (03/12/10) and found to be at low risk of cardiovascular complications for dental procedure (tooth extraction) using local anesthetic. Blood pressure medications were titrated in clinic today and patient to have close follow up with her primary physician.    Sincerely,   Bobby Rumpf  MD

## 2010-08-07 NOTE — Assessment & Plan Note (Signed)
Summary: per Viviana Trimble/eo   Vital Signs:  Patient profile:   50 year old female Weight:      232 pounds Temp:     98.9 degrees F oral Pulse rate:   75 / minute BP sitting:   171 / 104  (left arm) Cuff size:   regular  Vitals Entered By: Garen Grams LPN (February 25, 2010 2:27 PM) CC: hfu Is Patient Diabetic? No Pain Assessment Patient in pain? no        Primary Care Provider:  Ellery Plunk MD  CC:  hfu.  History of Present Illness: pt here for f/u of hospital visit.   feeling well today, no N/V/HA  HTN- BP elevated, did not bring meds.  thinks she is taking them as perscribed but cannot name them or say when she takes them  pain in arm-  has hard knot in vein where had IV placed some irritation. no redness, no pus  Habits & Providers  Alcohol-Tobacco-Diet     Tobacco Status: never  Current Medications (verified): 1)  Reglan 10 Mg Tabs (Metoclopramide Hcl) .... Take One 30 Min Before Meals and At Bedtime 2)  Promethazine Hcl 25 Mg Supp (Promethazine Hcl) .... Take One Every 6 Hours Per Rectum 3)  Antivert 12.5 Mg Tabs (Meclizine Hcl) .... Take One Two Times A Day When You Feel Dizzy 4)  Lisinopril 20 Mg Tabs (Lisinopril) .... Take One Daily 5)  Zocor 40 Mg Tabs (Simvastatin) .... Take One At Bedtime 6)  Metoprolol Tartrate 25 Mg Tabs (Metoprolol Tartrate) .... Take One Half Tab Two Times A Day 7)  Hydrochlorothiazide 25 Mg Tabs (Hydrochlorothiazide) .... Taek One Daily 8)  Hydralazine Hcl 50 Mg Tabs (Hydralazine Hcl) .... Take One Three Times A Day 9)  Norvasc 10 Mg Tabs (Amlodipine Besylate) .... Take One Daily 10)  Promethazine Hcl 25 Mg Tabs (Promethazine Hcl) .... One Tab By Mouth Q6hrs As Needed Emesis (Patient Also Has Suppositories Available)  Allergies (verified): 1)  * Contrast Dye  Physical Exam  General:  VS reviewed,, hypertensive Well-developed,well-nourished,in no acute distress; alert,appropriate and cooperative throughout examination Head:   Normocephalic and atraumatic without obvious abnormalities. No apparent alopecia or balding. Lungs:  Normal respiratory effort, chest expands symmetrically. Lungs are clear to auscultation, no crackles or wheezes. Heart:  Normal rate and regular rhythm. S1 and S2 normal without gallop, murmur, click, rub or other extra sounds. Abdomen:  obese Bowel sounds positive,abdomen soft and non-tender without masses, organomegaly or hernias noted. Extremities:  right forearm with hard vein, superficial thrombophebitis   Impression & Recommendations:  Problem # 1:  PHLEBITIS&THROMBOPHLEB SUP VEINS UPPER EXTREM (ICD-451.82) Assessment New nothing to do, warned pt may take weeks to resolve.  superficial vein Orders: [facility]- Est Level  3 (04540)  Problem # 2:  ESSENTIAL HYPERTENSION, BENIGN (ICD-401.1) Assessment: Unchanged will put medications to prehospital doses.  not sure why they were reduced in hospital.  pt was admitted with dehydration from vomitting, however after fluids in hospital vitals were essentially the same as today.  will see bac in one month and pt should bring meds. Her updated medication list for this problem includes:    Lisinopril 20 Mg Tabs (Lisinopril) .Marland Kitchen... Take one daily    Metoprolol Tartrate 25 Mg Tabs (Metoprolol tartrate) .Marland Kitchen... Take one half tab two times a day    Hydrochlorothiazide 25 Mg Tabs (Hydrochlorothiazide) .Marland Kitchen... Taek one daily    Hydralazine Hcl 50 Mg Tabs (Hydralazine hcl) .Marland Kitchen... Take one three times a  day    Norvasc 10 Mg Tabs (Amlodipine besylate) .Marland Kitchen... Take one daily  Orders: [facility]- Est Level  3 (60454)  Complete Medication List: 1)  Reglan 10 Mg Tabs (Metoclopramide hcl) .... Take one 30 min before meals and at bedtime 2)  Promethazine Hcl 25 Mg Supp (Promethazine hcl) .... Take one every 6 hours per rectum 3)  Antivert 12.5 Mg Tabs (Meclizine hcl) .... Take one two times a day when you feel dizzy 4)  Lisinopril 20 Mg Tabs (Lisinopril) .... Take one daily 5)   Zocor 40 Mg Tabs (Simvastatin) .... Take one at bedtime 6)  Metoprolol Tartrate 25 Mg Tabs (Metoprolol tartrate) .... Take one half tab two times a day 7)  Hydrochlorothiazide 25 Mg Tabs (Hydrochlorothiazide) .... Taek one daily 8)  Hydralazine Hcl 50 Mg Tabs (Hydralazine hcl) .... Take one three times a day 9)  Norvasc 10 Mg Tabs (Amlodipine besylate) .... Take one daily 10)  Promethazine Hcl 25 Mg Tabs (Promethazine hcl) .... One tab by mouth q6hrs as needed emesis (patient also has suppositories available)  Patient Instructions: 1)  come back in one month 2)  BRING YOUR MEDICATIONS 3)  take the medications as I have written them, not how they were written in the hospital.

## 2010-08-15 ENCOUNTER — Emergency Department (HOSPITAL_COMMUNITY)
Admission: EM | Admit: 2010-08-15 | Discharge: 2010-08-15 | Disposition: A | Discharge status: Home / Self Care | Payer: Medicaid Other | Attending: Emergency Medicine | Admitting: Emergency Medicine

## 2010-08-15 DIAGNOSIS — E7800 Pure hypercholesterolemia, unspecified: Secondary | ICD-10-CM | POA: Insufficient documentation

## 2010-08-15 DIAGNOSIS — R112 Nausea with vomiting, unspecified: Secondary | ICD-10-CM | POA: Insufficient documentation

## 2010-08-15 DIAGNOSIS — Z8679 Personal history of other diseases of the circulatory system: Secondary | ICD-10-CM | POA: Insufficient documentation

## 2010-08-15 DIAGNOSIS — I10 Essential (primary) hypertension: Secondary | ICD-10-CM | POA: Insufficient documentation

## 2010-08-15 DIAGNOSIS — R51 Headache: Secondary | ICD-10-CM | POA: Insufficient documentation

## 2010-08-15 DIAGNOSIS — K5289 Other specified noninfective gastroenteritis and colitis: Secondary | ICD-10-CM | POA: Insufficient documentation

## 2010-08-15 DIAGNOSIS — E669 Obesity, unspecified: Secondary | ICD-10-CM | POA: Insufficient documentation

## 2010-08-15 LAB — URINE MICROSCOPIC-ADD ON

## 2010-08-15 LAB — DIFFERENTIAL
Lymphocytes Relative: 43 % (ref 12–46)
Lymphs Abs: 3.5 10*3/uL (ref 0.7–4.0)
Monocytes Absolute: 0.5 10*3/uL (ref 0.1–1.0)
Monocytes Relative: 6 % (ref 3–12)
Neutro Abs: 3.4 10*3/uL (ref 1.7–7.7)
Neutrophils Relative %: 43 % (ref 43–77)

## 2010-08-15 LAB — URINALYSIS, ROUTINE W REFLEX MICROSCOPIC
Leukocytes, UA: NEGATIVE
Specific Gravity, Urine: 1.022 (ref 1.005–1.030)
Urine Glucose, Fasting: NEGATIVE mg/dL
Urobilinogen, UA: 0.2 mg/dL (ref 0.0–1.0)

## 2010-08-15 LAB — COMPREHENSIVE METABOLIC PANEL
ALT: 29 U/L (ref 0–35)
AST: 31 U/L (ref 0–37)
CO2: 24 mEq/L (ref 19–32)
Calcium: 8.8 mg/dL (ref 8.4–10.5)
Chloride: 107 mEq/L (ref 96–112)
Creatinine, Ser: 1.23 mg/dL — ABNORMAL HIGH (ref 0.4–1.2)
GFR calc non Af Amer: 47 mL/min — ABNORMAL LOW (ref >60)
Glucose, Bld: 113 mg/dL — ABNORMAL HIGH (ref 70–99)
Total Bilirubin: 0.3 mg/dL (ref 0.3–1.2)

## 2010-08-15 LAB — CBC
HCT: 29.2 % — ABNORMAL LOW (ref 36.0–46.0)
Hemoglobin: 9.9 g/dL — ABNORMAL LOW (ref 12.0–15.0)
MCH: 25.6 pg — ABNORMAL LOW (ref 26.0–34.0)
MCHC: 33.9 g/dL (ref 30.0–36.0)
RBC: 3.87 MIL/uL (ref 3.87–5.11)

## 2010-08-15 LAB — LIPASE, BLOOD: Lipase: 37 U/L (ref 11–59)

## 2010-08-16 ENCOUNTER — Emergency Department (HOSPITAL_COMMUNITY)
Admission: EM | Admit: 2010-08-16 | Discharge: 2010-08-17 | Disposition: A | Discharge status: Home / Self Care | Payer: Medicaid Other | Attending: Emergency Medicine | Admitting: Emergency Medicine

## 2010-08-16 DIAGNOSIS — I10 Essential (primary) hypertension: Secondary | ICD-10-CM | POA: Insufficient documentation

## 2010-08-16 DIAGNOSIS — W19XXXA Unspecified fall, initial encounter: Secondary | ICD-10-CM | POA: Insufficient documentation

## 2010-08-16 DIAGNOSIS — M545 Low back pain, unspecified: Secondary | ICD-10-CM | POA: Insufficient documentation

## 2010-08-16 DIAGNOSIS — Z8679 Personal history of other diseases of the circulatory system: Secondary | ICD-10-CM | POA: Insufficient documentation

## 2010-08-16 DIAGNOSIS — T07XXXA Unspecified multiple injuries, initial encounter: Secondary | ICD-10-CM | POA: Insufficient documentation

## 2010-08-16 DIAGNOSIS — M79609 Pain in unspecified limb: Secondary | ICD-10-CM | POA: Insufficient documentation

## 2010-08-16 DIAGNOSIS — Y92009 Unspecified place in unspecified non-institutional (private) residence as the place of occurrence of the external cause: Secondary | ICD-10-CM | POA: Insufficient documentation

## 2010-08-16 LAB — URINE CULTURE: Colony Count: NO GROWTH

## 2010-08-17 LAB — URINALYSIS, ROUTINE W REFLEX MICROSCOPIC
Leukocytes, UA: NEGATIVE
Nitrite: NEGATIVE
Protein, ur: 100 mg/dL — AB
Urine Glucose, Fasting: NEGATIVE mg/dL
Urobilinogen, UA: 0.2 mg/dL (ref 0.0–1.0)

## 2010-08-17 LAB — URINE MICROSCOPIC-ADD ON

## 2010-08-18 ENCOUNTER — Ambulatory Visit (INDEPENDENT_AMBULATORY_CARE_PROVIDER_SITE_OTHER): Payer: Medicaid Other | Admitting: Family Medicine

## 2010-08-18 ENCOUNTER — Encounter: Payer: Self-pay | Admitting: Family Medicine

## 2010-08-18 DIAGNOSIS — K319 Disease of stomach and duodenum, unspecified: Secondary | ICD-10-CM

## 2010-08-18 DIAGNOSIS — M25561 Pain in right knee: Secondary | ICD-10-CM

## 2010-08-18 DIAGNOSIS — R1110 Vomiting, unspecified: Secondary | ICD-10-CM

## 2010-08-18 DIAGNOSIS — I10 Essential (primary) hypertension: Secondary | ICD-10-CM

## 2010-08-18 DIAGNOSIS — M25569 Pain in unspecified knee: Secondary | ICD-10-CM

## 2010-08-18 MED ORDER — METOCLOPRAMIDE HCL 10 MG PO TABS: 5.0000 mg | ORAL_TABLET | Freq: Four times a day (QID) | ORAL | Status: DC

## 2010-08-18 MED ORDER — TRAMADOL HCL 50 MG PO TABS: 50.0000 mg | ORAL_TABLET | Freq: Four times a day (QID) | ORAL | Status: DC | PRN

## 2010-08-18 MED ORDER — HYDROCHLOROTHIAZIDE 25 MG PO TABS: 25.0000 mg | ORAL_TABLET | Freq: Every day | ORAL | Status: DC

## 2010-08-18 MED ORDER — AMLODIPINE BESYLATE 10 MG PO TABS: 10.0000 mg | ORAL_TABLET | Freq: Every day | ORAL | Status: DC

## 2010-08-18 MED ORDER — HYDRALAZINE HCL 50 MG PO TABS: 50.0000 mg | ORAL_TABLET | Freq: Three times a day (TID) | ORAL | Status: DC

## 2010-08-18 MED ORDER — METOPROLOL SUCCINATE ER 50 MG PO TB24: 50.0000 mg | ORAL_TABLET | Freq: Every day | ORAL | Status: DC

## 2010-08-18 MED ORDER — SIMVASTATIN 40 MG PO TABS: 40.0000 mg | ORAL_TABLET | Freq: Every day | ORAL | Status: DC

## 2010-08-18 MED ORDER — LISINOPRIL 20 MG PO TABS: 20.0000 mg | ORAL_TABLET | Freq: Every day | ORAL | Status: DC

## 2010-08-18 NOTE — Assessment & Plan Note (Signed)
Refilled Reglan, she states that she has been vomiting again.  She does not have the diagnosis of diabetes yet.

## 2010-08-18 NOTE — Assessment & Plan Note (Signed)
Poor understanding of diet and food choices.  Health Educator may help.  Low health literacy.  Explained she needs to come in to discuss this, she admits to only coming in when there is something wrong with her.

## 2010-08-18 NOTE — Assessment & Plan Note (Addendum)
Patient has been out of her meds for one week, she does not know the names or which are for her BP.  It was reviewed, may need Pharmacy visit or Health Educator visit to help her with this.  All meds were re-ordered today

## 2010-08-18 NOTE — Progress Notes (Signed)
  Subjective:    Patient ID: Lindsey [name], female    DOB: [birth year], 50 y.o.   MRN: [id number]  Hypertension This is a chronic problem. The current episode started in the past 7 days. The problem has been gradually worsening since onset. The problem is uncontrolled. Associated symptoms include malaise/fatigue and shortness of breath. Pertinent negatives include no chest pain. Risk factors for coronary artery disease include dyslipidemia, obesity and sedentary lifestyle.      Review of Systems  Constitutional: Positive for malaise/fatigue and fatigue. Negative for activity change and appetite change.  Respiratory: Positive for shortness of breath. Negative for chest tightness.   Cardiovascular: Negative for chest pain and leg swelling.  Gastrointestinal:       Reports vomiting, chronic issue, on Reglan per primary MD for this  Musculoskeletal:       Bilateral knee pain, stated I had a morphine injection at the ER on 08/16/10 and since then I do not have pain.  Neurological: Negative for dizziness, syncope and numbness.       Objective:   Physical Exam  Constitutional:       Morbidly obese, in no acute distress  Cardiovascular: Normal rate and regular rhythm.   Pulmonary/Chest: Effort normal and breath sounds normal.  Skin: Skin is warm and dry.          Assessment & Plan:

## 2010-08-18 NOTE — Patient Instructions (Signed)
It is very important that you get to know your meds If you can afford purchase a pill box for meds with 3 time slots Eat healthy Drink a lot of water Follow up apt with Dr. Hulen Luster in one month for Health Maintenance

## 2010-09-15 ENCOUNTER — Other Ambulatory Visit: Payer: Self-pay | Admitting: Family Medicine

## 2010-09-15 NOTE — Telephone Encounter (Signed)
Refill request

## 2010-09-17 LAB — BASIC METABOLIC PANEL
BUN: 11 mg/dL (ref 6–23)
CO2: 26 mEq/L (ref 19–32)
Calcium: 9.6 mg/dL (ref 8.4–10.5)
Chloride: 106 mEq/L (ref 96–112)
Creatinine, Ser: 1.23 mg/dL — ABNORMAL HIGH (ref 0.4–1.2)
Glucose, Bld: 108 mg/dL — ABNORMAL HIGH (ref 70–99)

## 2010-09-19 LAB — URINALYSIS, MICROSCOPIC ONLY
Bilirubin Urine: NEGATIVE
Glucose, UA: NEGATIVE mg/dL
Hgb urine dipstick: NEGATIVE
Ketones, ur: NEGATIVE mg/dL
Protein, ur: NEGATIVE mg/dL
Urobilinogen, UA: 0.2 mg/dL (ref 0.0–1.0)

## 2010-09-19 LAB — CBC
MCH: 27.4 pg (ref 26.0–34.0)
MCHC: 34.6 g/dL (ref 30.0–36.0)
MCV: 79.2 fL (ref 78.0–100.0)
Platelets: 338 10*3/uL (ref 150–400)

## 2010-09-19 LAB — COMPREHENSIVE METABOLIC PANEL
Albumin: 3.3 g/dL — ABNORMAL LOW (ref 3.5–5.2)
BUN: 10 mg/dL (ref 6–23)
Calcium: 8.7 mg/dL (ref 8.4–10.5)
Glucose, Bld: 87 mg/dL (ref 70–99)
Potassium: 3.7 mEq/L (ref 3.5–5.1)
Total Protein: 7.1 g/dL (ref 6.0–8.3)

## 2010-09-19 LAB — URINE CULTURE
Colony Count: 15000
Culture  Setup Time: 201108160909
Special Requests: NEGATIVE

## 2010-09-19 LAB — POCT I-STAT, CHEM 8
Creatinine, Ser: 1.5 mg/dL — ABNORMAL HIGH (ref 0.4–1.2)
Glucose, Bld: 126 mg/dL — ABNORMAL HIGH (ref 70–99)
HCT: 37 % (ref 36.0–46.0)
Hemoglobin: 12.6 g/dL (ref 12.0–15.0)
Potassium: 4 mEq/L (ref 3.5–5.1)
Sodium: 139 mEq/L (ref 135–145)
TCO2: 24 mmol/L (ref 0–100)

## 2010-09-19 LAB — LIPID PANEL
LDL Cholesterol: 113 mg/dL — ABNORMAL HIGH (ref 0–99)
Total CHOL/HDL Ratio: 3.9 RATIO
Triglycerides: 86 mg/dL (ref <150)
VLDL: 17 mg/dL (ref 0–40)

## 2010-09-19 LAB — AMYLASE: Amylase: 37 U/L (ref 0–105)

## 2010-09-19 LAB — DIFFERENTIAL
Basophils Relative: 0 % (ref 0–1)
Eosinophils Absolute: 0.4 10*3/uL (ref 0.0–0.7)
Eosinophils Relative: 5 % (ref 0–5)
Neutrophils Relative %: 47 % (ref 43–77)

## 2010-09-19 LAB — LIPASE, BLOOD: Lipase: 26 U/L (ref 11–59)

## 2010-09-19 LAB — POCT CARDIAC MARKERS
CKMB, poc: <1 ng/mL — ABNORMAL LOW (ref 1.0–8.0)
Myoglobin, poc: 57.9 ng/mL (ref 12–200)

## 2010-09-20 LAB — COMPREHENSIVE METABOLIC PANEL
ALT: 26 U/L (ref 0–35)
AST: 30 U/L (ref 0–37)
Albumin: 3.3 g/dL — ABNORMAL LOW (ref 3.5–5.2)
Alkaline Phosphatase: 85 U/L (ref 39–117)
BUN: 12 mg/dL (ref 6–23)
CO2: 24 mEq/L (ref 19–32)
Calcium: 8.8 mg/dL (ref 8.4–10.5)
Chloride: 108 mEq/L (ref 96–112)
Creatinine, Ser: 1.19 mg/dL (ref 0.4–1.2)
GFR calc Af Amer: 59 mL/min — ABNORMAL LOW (ref >60)
GFR calc non Af Amer: 49 mL/min — ABNORMAL LOW (ref >60)
Glucose, Bld: 122 mg/dL — ABNORMAL HIGH (ref 70–99)
Potassium: 3.5 mEq/L (ref 3.5–5.1)
Sodium: 139 mEq/L (ref 135–145)
Total Bilirubin: 0.2 mg/dL — ABNORMAL LOW (ref 0.3–1.2)
Total Protein: 7.1 g/dL (ref 6.0–8.3)

## 2010-09-20 LAB — URINE CULTURE

## 2010-09-20 LAB — CBC
HCT: 32.8 % — ABNORMAL LOW (ref 36.0–46.0)
Hemoglobin: 11 g/dL — ABNORMAL LOW (ref 12.0–15.0)
MCH: 27.7 pg (ref 26.0–34.0)
RBC: 3.96 MIL/uL (ref 3.87–5.11)

## 2010-09-20 LAB — DIFFERENTIAL
Basophils Absolute: 0.1 10*3/uL (ref 0.0–0.1)
Basophils Relative: 1 % (ref 0–1)
Eosinophils Absolute: 0.5 10*3/uL (ref 0.0–0.7)
Monocytes Relative: 5 % (ref 3–12)
Neutrophils Relative %: 55 % (ref 43–77)

## 2010-09-20 LAB — URINALYSIS, ROUTINE W REFLEX MICROSCOPIC
Specific Gravity, Urine: 1.026 (ref 1.005–1.030)
Urobilinogen, UA: 0.2 mg/dL (ref 0.0–1.0)
pH: 5 (ref 5.0–8.0)

## 2010-09-20 LAB — URINE MICROSCOPIC-ADD ON

## 2010-09-20 LAB — POCT PREGNANCY, URINE: Preg Test, Ur: NEGATIVE

## 2010-09-21 LAB — BASIC METABOLIC PANEL
BUN: 13 mg/dL (ref 6–23)
BUN: 15 mg/dL (ref 6–23)
CO2: 28 mEq/L (ref 19–32)
CO2: 29 mEq/L (ref 19–32)
Calcium: 8.7 mg/dL (ref 8.4–10.5)
Chloride: 103 mEq/L (ref 96–112)
Chloride: 104 mEq/L (ref 96–112)
Chloride: 104 mEq/L (ref 96–112)
Creatinine, Ser: 1.43 mg/dL — ABNORMAL HIGH (ref 0.4–1.2)
GFR calc Af Amer: 48 mL/min — ABNORMAL LOW (ref >60)
GFR calc non Af Amer: 39 mL/min — ABNORMAL LOW (ref >60)
Glucose, Bld: 116 mg/dL — ABNORMAL HIGH (ref 70–99)
Potassium: 4 mEq/L (ref 3.5–5.1)
Potassium: 4.3 mEq/L (ref 3.5–5.1)
Sodium: 138 mEq/L (ref 135–145)

## 2010-09-21 LAB — FERRITIN: Ferritin: 11 ng/mL (ref 10–291)

## 2010-09-21 LAB — COMPREHENSIVE METABOLIC PANEL
ALT: 25 U/L (ref 0–35)
BUN: 13 mg/dL (ref 6–23)
Calcium: 8.9 mg/dL (ref 8.4–10.5)
Glucose, Bld: 109 mg/dL — ABNORMAL HIGH (ref 70–99)
Sodium: 137 mEq/L (ref 135–145)
Total Protein: 7.7 g/dL (ref 6.0–8.3)

## 2010-09-21 LAB — CBC
HCT: 33.2 % — ABNORMAL LOW (ref 36.0–46.0)
Hemoglobin: 11.3 g/dL — ABNORMAL LOW (ref 12.0–15.0)
MCHC: 33.6 g/dL (ref 30.0–36.0)
MCHC: 33.7 g/dL (ref 30.0–36.0)
MCV: 81.9 fL (ref 78.0–100.0)
MCV: 82.8 fL (ref 78.0–100.0)
Platelets: 235 10*3/uL (ref 150–400)
Platelets: 244 10*3/uL (ref 150–400)
RBC: 4.13 MIL/uL (ref 3.87–5.11)
RDW: 18.3 % — ABNORMAL HIGH (ref 11.5–15.5)
RDW: 18.6 % — ABNORMAL HIGH (ref 11.5–15.5)
WBC: 10.7 10*3/uL — ABNORMAL HIGH (ref 4.0–10.5)

## 2010-09-21 LAB — POCT I-STAT, CHEM 8
BUN: 13 mg/dL (ref 6–23)
Creatinine, Ser: 1.3 mg/dL — ABNORMAL HIGH (ref 0.4–1.2)
Sodium: 141 mEq/L (ref 135–145)
TCO2: 26 mmol/L (ref 0–100)

## 2010-09-21 LAB — URINALYSIS, ROUTINE W REFLEX MICROSCOPIC
Leukocytes, UA: NEGATIVE
Protein, ur: 100 mg/dL — AB
Urobilinogen, UA: 0.2 mg/dL (ref 0.0–1.0)

## 2010-09-21 LAB — CARDIAC PANEL(CRET KIN+CKTOT+MB+TROPI)
Relative Index: INVALID (ref 0.0–2.5)
Troponin I: 0.03 ng/mL (ref 0.00–0.06)
Troponin I: 0.03 ng/mL (ref 0.00–0.06)

## 2010-09-21 LAB — LIPID PANEL
Cholesterol: 177 mg/dL (ref 0–200)
HDL: 44 mg/dL (ref >39)
LDL Cholesterol: 115 mg/dL — ABNORMAL HIGH (ref 0–99)
Total CHOL/HDL Ratio: 4 RATIO

## 2010-09-21 LAB — DIFFERENTIAL
Lymphs Abs: 4 10*3/uL (ref 0.7–4.0)
Monocytes Relative: 7 % (ref 3–12)
Neutro Abs: 4.3 10*3/uL (ref 1.7–7.7)
Neutrophils Relative %: 45 % (ref 43–77)

## 2010-09-21 LAB — POCT CARDIAC MARKERS
Myoglobin, poc: 70 ng/mL (ref 12–200)
Troponin i, poc: 0.05 ng/mL (ref 0.00–0.09)

## 2010-09-21 LAB — MAGNESIUM: Magnesium: 2.3 mg/dL (ref 1.5–2.5)

## 2010-09-21 LAB — APTT: aPTT: 24 seconds (ref 24–37)

## 2010-09-21 LAB — URINE MICROSCOPIC-ADD ON

## 2010-09-21 LAB — CK TOTAL AND CKMB (NOT AT ARMC)
CK, MB: 1.2 ng/mL (ref 0.3–4.0)
Relative Index: INVALID (ref 0.0–2.5)
Total CK: 73 U/L (ref 7–177)

## 2010-09-21 LAB — TSH: TSH: 0.972 u[IU]/mL (ref 0.350–4.500)

## 2010-09-21 LAB — PROTIME-INR: INR: 1.01 (ref 0.00–1.49)

## 2010-09-21 LAB — RETICULOCYTES: Retic Count, Absolute: 80 10*3/uL (ref 19.0–186.0)

## 2010-09-22 ENCOUNTER — Ambulatory Visit (INDEPENDENT_AMBULATORY_CARE_PROVIDER_SITE_OTHER): Payer: Medicaid Other | Admitting: Family Medicine

## 2010-09-22 ENCOUNTER — Encounter: Payer: Self-pay | Admitting: Family Medicine

## 2010-09-22 DIAGNOSIS — G4730 Sleep apnea, unspecified: Secondary | ICD-10-CM

## 2010-09-22 DIAGNOSIS — I6350 Cerebral infarction due to unspecified occlusion or stenosis of unspecified cerebral artery: Secondary | ICD-10-CM

## 2010-09-22 DIAGNOSIS — I10 Essential (primary) hypertension: Secondary | ICD-10-CM

## 2010-09-22 DIAGNOSIS — G629 Polyneuropathy, unspecified: Secondary | ICD-10-CM | POA: Insufficient documentation

## 2010-09-22 DIAGNOSIS — G589 Mononeuropathy, unspecified: Secondary | ICD-10-CM

## 2010-09-22 LAB — BASIC METABOLIC PANEL
BUN: 9 mg/dL (ref 6–23)
CO2: 24 mEq/L (ref 19–32)
CO2: 24 mEq/L (ref 19–32)
Calcium: 8.4 mg/dL (ref 8.4–10.5)
Chloride: 107 mEq/L (ref 96–112)
Creatinine, Ser: 1.21 mg/dL — ABNORMAL HIGH (ref 0.4–1.2)
GFR calc Af Amer: 58 mL/min — ABNORMAL LOW (ref >60)
GFR calc non Af Amer: 53 mL/min — ABNORMAL LOW (ref >60)
Glucose, Bld: 79 mg/dL (ref 70–99)

## 2010-09-22 LAB — URINALYSIS, ROUTINE W REFLEX MICROSCOPIC
Glucose, UA: NEGATIVE mg/dL
Hgb urine dipstick: NEGATIVE
Nitrite: NEGATIVE
Specific Gravity, Urine: 1.028 (ref 1.005–1.030)
Specific Gravity, Urine: 1.025 (ref 1.005–1.030)
Urobilinogen, UA: 1 mg/dL (ref 0.0–1.0)
pH: 5.5 (ref 5.0–8.0)

## 2010-09-22 LAB — COMPREHENSIVE METABOLIC PANEL
Alkaline Phosphatase: 82 U/L (ref 39–117)
BUN: 12 mg/dL (ref 6–23)
Chloride: 104 mEq/L (ref 96–112)
Glucose, Bld: 110 mg/dL — ABNORMAL HIGH (ref 70–99)
Potassium: 3.5 mEq/L (ref 3.5–5.1)
Total Bilirubin: 0.6 mg/dL (ref 0.3–1.2)

## 2010-09-22 LAB — CARDIAC PANEL(CRET KIN+CKTOT+MB+TROPI)
CK, MB: 0.8 ng/mL (ref 0.3–4.0)
Relative Index: INVALID (ref 0.0–2.5)
Relative Index: INVALID (ref 0.0–2.5)
Total CK: 55 U/L (ref 7–177)
Total CK: 56 U/L (ref 7–177)

## 2010-09-22 LAB — CBC
HCT: 37 % (ref 36.0–46.0)
Hemoglobin: 12.5 g/dL (ref 12.0–15.0)
MCHC: 33.3 g/dL (ref 30.0–36.0)
MCV: 82.2 fL (ref 78.0–100.0)
RBC: 3.85 MIL/uL — ABNORMAL LOW (ref 3.87–5.11)
RDW: 17.4 % — ABNORMAL HIGH (ref 11.5–15.5)
WBC: 10.4 10*3/uL (ref 4.0–10.5)

## 2010-09-22 LAB — DIFFERENTIAL
Basophils Absolute: 0.1 10*3/uL (ref 0.0–0.1)
Basophils Relative: 1 % (ref 0–1)
Basophils Relative: 0 % (ref 0–1)
Lymphocytes Relative: 44 % (ref 12–46)
Lymphs Abs: 4.1 10*3/uL — ABNORMAL HIGH (ref 0.7–4.0)
Monocytes Absolute: 0.6 10*3/uL (ref 0.1–1.0)
Monocytes Relative: 6 % (ref 3–12)
Neutro Abs: 7.7 10*3/uL (ref 1.7–7.7)
Neutro Abs: 4 10*3/uL (ref 1.7–7.7)
Neutrophils Relative %: 74 % (ref 43–77)

## 2010-09-22 LAB — RAPID URINE DRUG SCREEN, HOSP PERFORMED
Barbiturates: NOT DETECTED
Opiates: NOT DETECTED

## 2010-09-22 LAB — URINE MICROSCOPIC-ADD ON

## 2010-09-22 LAB — CK TOTAL AND CKMB (NOT AT ARMC): Relative Index: INVALID (ref 0.0–2.5)

## 2010-09-22 MED ORDER — GABAPENTIN 100 MG PO CAPS: 300.0000 mg | ORAL_CAPSULE | Freq: Every day | ORAL | Status: DC

## 2010-09-22 NOTE — Assessment & Plan Note (Signed)
BP Readings from Last 3 Encounters:  09/22/10 182/126  08/18/10 177/107  07/21/10 210/125   BP elevated today.  Pt blames life stress.  Since pt only taking hydralazine qday, will have her f/u in 2-3 weeks after taking it TID.  Consider spironolactone, but pt would have to be able to f/u closely for K monitoring

## 2010-09-22 NOTE — Patient Instructions (Signed)
It was nice to see you today We will set up the neurology and the Physical Therapy appts for you Come back in 2-3 weeks for a blood pressure check and for a well woman exam Please bring all your medications Take the hydralazine three times a day

## 2010-09-22 NOTE — Assessment & Plan Note (Signed)
Wearing CPAP nightly, does not have trouble with it.

## 2010-09-22 NOTE — Assessment & Plan Note (Signed)
Pt with hx of several CVAs. Now having falls.  Will send to neurology for evaluation, also to PT for help with mobility (gave script for walker)

## 2010-09-22 NOTE — Assessment & Plan Note (Signed)
Pain in legs without strength deficit or lack of sensation.  I think this is neuropathy and I will try gabapentin for this.  Pt to see neurology to evaluate.

## 2010-09-22 NOTE — Progress Notes (Signed)
  Subjective:    Patient ID: Lindsey [name], female    DOB: 04/21/1961, 50 y.o.   MRN: [id number]  HPI HTN- states that she is taking meds but is only taking them q day and is not taking hydralazine TID.  Denies CP, palpitations or HA that are worse than her usual  Sleep apnea- sleeping with CPAP machine nightly  Falls- having falls because legs give way.  Also, has pain in legs that is worse at night.  Burning pain and feet are the worst.  Has been like this since last CVA.  No new weakness, not getting worse.  Not dizzy during falls, no LOC.   Review of Systems     Objective:   Physical Exam    Vital signs reviewed General appearance - alert, well appearing, and in no distress and oriented to person, place, and time Heart - normal rate, regular rhythm, normal S1, S2, no murmurs, rubs, clicks or gallops Chest - clear to auscultation, no wheezes, rales or rhonchi, symmetric air entry, no tachypnea, retractions or cyanosis MSK- lower extremities with no edema, no pain with passive or active ROM, full strength, normal sensation    Assessment & Plan:

## 2010-09-24 LAB — BASIC METABOLIC PANEL
BUN: 8 mg/dL (ref 6–23)
CO2: 24 mEq/L (ref 19–32)
Calcium: 8.5 mg/dL (ref 8.4–10.5)
Creatinine, Ser: 1.12 mg/dL (ref 0.4–1.2)
GFR calc Af Amer: >60 mL/min (ref >60)

## 2010-09-24 LAB — URINALYSIS, ROUTINE W REFLEX MICROSCOPIC
Bilirubin Urine: NEGATIVE
Glucose, UA: NEGATIVE mg/dL
Ketones, ur: NEGATIVE mg/dL
Ketones, ur: NEGATIVE mg/dL
Leukocytes, UA: NEGATIVE
Nitrite: NEGATIVE
Nitrite: NEGATIVE
Protein, ur: NEGATIVE mg/dL
Specific Gravity, Urine: 1.024 (ref 1.005–1.030)
pH: 5.5 (ref 5.0–8.0)

## 2010-09-24 LAB — HEPATIC FUNCTION PANEL
Albumin: 3.5 g/dL (ref 3.5–5.2)
Bilirubin, Direct: 0.2 mg/dL (ref 0.0–0.3)
Indirect Bilirubin: 0.4 mg/dL (ref 0.3–0.9)
Total Bilirubin: 0.6 mg/dL (ref 0.3–1.2)

## 2010-09-24 LAB — URINE CULTURE
Colony Count: 45000
Colony Count: NO GROWTH
Culture: NO GROWTH

## 2010-09-24 LAB — CBC
HCT: 30.6 % — ABNORMAL LOW (ref 36.0–46.0)
HCT: 32.6 % — ABNORMAL LOW (ref 36.0–46.0)
HCT: 36.8 % (ref 36.0–46.0)
MCHC: 33 g/dL (ref 30.0–36.0)
MCHC: 33.2 g/dL (ref 30.0–36.0)
MCHC: 33.9 g/dL (ref 30.0–36.0)
MCV: 81.7 fL (ref 78.0–100.0)
MCV: 82.7 fL (ref 78.0–100.0)
Platelets: 320 10*3/uL (ref 150–400)
Platelets: 386 10*3/uL (ref 150–400)
RBC: 3.7 MIL/uL — ABNORMAL LOW (ref 3.87–5.11)
RDW: 17.6 % — ABNORMAL HIGH (ref 11.5–15.5)
RDW: 18.4 % — ABNORMAL HIGH (ref 11.5–15.5)
WBC: 15 10*3/uL — ABNORMAL HIGH (ref 4.0–10.5)

## 2010-09-24 LAB — COMPREHENSIVE METABOLIC PANEL
AST: 21 U/L (ref 0–37)
Albumin: 4.3 g/dL (ref 3.5–5.2)
BUN: 13 mg/dL (ref 6–23)
Calcium: 9.6 mg/dL (ref 8.4–10.5)
Chloride: 103 mEq/L (ref 96–112)
Creatinine, Ser: 1.15 mg/dL (ref 0.4–1.2)
GFR calc Af Amer: >60 mL/min (ref >60)
Total Protein: 9.2 g/dL — ABNORMAL HIGH (ref 6.0–8.3)

## 2010-09-24 LAB — DIFFERENTIAL
Basophils Absolute: 0 10*3/uL (ref 0.0–0.1)
Basophils Absolute: 0.1 10*3/uL (ref 0.0–0.1)
Basophils Relative: 1 % (ref 0–1)
Eosinophils Relative: 0 % (ref 0–5)
Eosinophils Relative: 4 % (ref 0–5)
Lymphocytes Relative: 16 % (ref 12–46)
Lymphocytes Relative: 29 % (ref 12–46)
Lymphs Abs: 2.4 10*3/uL (ref 0.7–4.0)
Monocytes Absolute: 0.3 10*3/uL (ref 0.1–1.0)
Monocytes Absolute: 0.5 10*3/uL (ref 0.1–1.0)
Neutro Abs: 12.2 10*3/uL — ABNORMAL HIGH (ref 1.7–7.7)
Neutro Abs: 5.2 10*3/uL (ref 1.7–7.7)

## 2010-09-24 LAB — POCT CARDIAC MARKERS
Myoglobin, poc: 118 ng/mL (ref 12–200)
Myoglobin, poc: 87.4 ng/mL (ref 12–200)
Troponin i, poc: <0.05 ng/mL (ref 0.00–0.09)

## 2010-09-24 LAB — TROPONIN I: Troponin I: <0.01 ng/mL (ref 0.00–0.06)

## 2010-09-24 LAB — URINE MICROSCOPIC-ADD ON

## 2010-09-24 LAB — CK TOTAL AND CKMB (NOT AT ARMC)
CK, MB: 1.2 ng/mL (ref 0.3–4.0)
Total CK: 109 U/L (ref 7–177)

## 2010-09-24 LAB — POCT I-STAT, CHEM 8
BUN: 11 mg/dL (ref 6–23)
Calcium, Ion: 1.12 mmol/L (ref 1.12–1.32)
HCT: 34 % — ABNORMAL LOW (ref 36.0–46.0)
Hemoglobin: 11.6 g/dL — ABNORMAL LOW (ref 12.0–15.0)
Sodium: 139 mEq/L (ref 135–145)
TCO2: 25 mmol/L (ref 0–100)

## 2010-09-24 LAB — LIPASE, BLOOD: Lipase: 33 U/L (ref 11–59)

## 2010-09-24 LAB — CARDIAC PANEL(CRET KIN+CKTOT+MB+TROPI): Relative Index: INVALID (ref 0.0–2.5)

## 2010-09-26 LAB — POCT CARDIAC MARKERS
CKMB, poc: <1 ng/mL — ABNORMAL LOW (ref 1.0–8.0)
CKMB, poc: <1 ng/mL — ABNORMAL LOW (ref 1.0–8.0)
Myoglobin, poc: 84.8 ng/mL (ref 12–200)
Myoglobin, poc: 93.2 ng/mL (ref 12–200)
Troponin i, poc: <0.05 ng/mL (ref 0.00–0.09)
Troponin i, poc: <0.05 ng/mL (ref 0.00–0.09)

## 2010-09-26 LAB — URINALYSIS, ROUTINE W REFLEX MICROSCOPIC
Ketones, ur: 15 mg/dL — AB
Nitrite: NEGATIVE
Urobilinogen, UA: 1 mg/dL (ref 0.0–1.0)

## 2010-09-26 LAB — BASIC METABOLIC PANEL
BUN: 13 mg/dL (ref 6–23)
Calcium: 8.5 mg/dL (ref 8.4–10.5)
GFR calc non Af Amer: 40 mL/min — ABNORMAL LOW (ref >60)
Glucose, Bld: 103 mg/dL — ABNORMAL HIGH (ref 70–99)
Sodium: 138 mEq/L (ref 135–145)

## 2010-09-26 LAB — DIFFERENTIAL
Basophils Absolute: 0.4 10*3/uL — ABNORMAL HIGH (ref 0.0–0.1)
Lymphocytes Relative: 41 % (ref 12–46)
Neutro Abs: 5.3 10*3/uL (ref 1.7–7.7)
Neutrophils Relative %: 45 % (ref 43–77)

## 2010-09-26 LAB — CBC
Hemoglobin: 11.6 g/dL — ABNORMAL LOW (ref 12.0–15.0)
Platelets: 390 10*3/uL (ref 150–400)
RDW: 17.7 % — ABNORMAL HIGH (ref 11.5–15.5)

## 2010-09-29 LAB — RAPID URINE DRUG SCREEN, HOSP PERFORMED
Amphetamines: NOT DETECTED
Benzodiazepines: NOT DETECTED
Cocaine: NOT DETECTED
Opiates: POSITIVE — AB
Tetrahydrocannabinol: NOT DETECTED

## 2010-09-29 LAB — BASIC METABOLIC PANEL
BUN: 18 mg/dL (ref 6–23)
CO2: 27 mEq/L (ref 19–32)
Calcium: 8.6 mg/dL (ref 8.4–10.5)
Chloride: 100 mEq/L (ref 96–112)
Chloride: 105 mEq/L (ref 96–112)
Creatinine, Ser: 1.54 mg/dL — ABNORMAL HIGH (ref 0.4–1.2)
Creatinine, Ser: 1.74 mg/dL — ABNORMAL HIGH (ref 0.4–1.2)
GFR calc Af Amer: 38 mL/min — ABNORMAL LOW (ref >60)
GFR calc Af Amer: 44 mL/min — ABNORMAL LOW (ref >60)
Glucose, Bld: 105 mg/dL — ABNORMAL HIGH (ref 70–99)
Sodium: 134 mEq/L — ABNORMAL LOW (ref 135–145)

## 2010-09-29 LAB — POCT CARDIAC MARKERS
CKMB, poc: <1 ng/mL — ABNORMAL LOW (ref 1.0–8.0)
CKMB, poc: <1 ng/mL — ABNORMAL LOW (ref 1.0–8.0)
Myoglobin, poc: 75.2 ng/mL (ref 12–200)
Troponin i, poc: <0.05 ng/mL (ref 0.00–0.09)
Troponin i, poc: <0.05 ng/mL (ref 0.00–0.09)

## 2010-09-29 LAB — COMPREHENSIVE METABOLIC PANEL
ALT: 19 U/L (ref 0–35)
AST: 17 U/L (ref 0–37)
Albumin: 3.1 g/dL — ABNORMAL LOW (ref 3.5–5.2)
Alkaline Phosphatase: 69 U/L (ref 39–117)
Calcium: 8.8 mg/dL (ref 8.4–10.5)
GFR calc Af Amer: 52 mL/min — ABNORMAL LOW (ref >60)
Glucose, Bld: 93 mg/dL (ref 70–99)
Potassium: 4 mEq/L (ref 3.5–5.1)
Sodium: 136 mEq/L (ref 135–145)
Total Protein: 6.8 g/dL (ref 6.0–8.3)

## 2010-09-29 LAB — POCT I-STAT, CHEM 8
Chloride: 107 mEq/L (ref 96–112)
Creatinine, Ser: 1.3 mg/dL — ABNORMAL HIGH (ref 0.4–1.2)
Glucose, Bld: 90 mg/dL (ref 70–99)
Potassium: 4.6 mEq/L (ref 3.5–5.1)
Sodium: 139 mEq/L (ref 135–145)

## 2010-09-29 LAB — CBC
Hemoglobin: 10.5 g/dL — ABNORMAL LOW (ref 12.0–15.0)
Hemoglobin: 10.5 g/dL — ABNORMAL LOW (ref 12.0–15.0)
MCHC: 33 g/dL (ref 30.0–36.0)
MCHC: 33.7 g/dL (ref 30.0–36.0)
MCV: 81.5 fL (ref 78.0–100.0)
Platelets: 348 10*3/uL (ref 150–400)
RBC: 3.83 MIL/uL — ABNORMAL LOW (ref 3.87–5.11)
RBC: 3.89 MIL/uL (ref 3.87–5.11)
RBC: 4.21 MIL/uL (ref 3.87–5.11)
RDW: 18 % — ABNORMAL HIGH (ref 11.5–15.5)
RDW: 18 % — ABNORMAL HIGH (ref 11.5–15.5)
WBC: 9.5 10*3/uL (ref 4.0–10.5)

## 2010-09-29 LAB — CARDIAC PANEL(CRET KIN+CKTOT+MB+TROPI)
CK, MB: 0.7 ng/mL (ref 0.3–4.0)
Relative Index: INVALID (ref 0.0–2.5)
Total CK: 49 U/L (ref 7–177)
Total CK: 54 U/L (ref 7–177)

## 2010-09-29 LAB — DIFFERENTIAL
Lymphocytes Relative: 40 % (ref 12–46)
Lymphs Abs: 3.8 10*3/uL (ref 0.7–4.0)
Monocytes Relative: 2 % — ABNORMAL LOW (ref 3–12)
Neutro Abs: 4.9 10*3/uL (ref 1.7–7.7)
Neutrophils Relative %: 51 % (ref 43–77)

## 2010-09-29 LAB — LIPID PANEL
Cholesterol: 155 mg/dL (ref 0–200)
HDL: 36 mg/dL — ABNORMAL LOW (ref >39)
LDL Cholesterol: 96 mg/dL (ref 0–99)
Total CHOL/HDL Ratio: 4.3 RATIO
Triglycerides: 116 mg/dL (ref <150)
VLDL: 23 mg/dL (ref 0–40)

## 2010-09-29 LAB — URINALYSIS, DIPSTICK ONLY
Bilirubin Urine: NEGATIVE
Glucose, UA: NEGATIVE mg/dL
Hgb urine dipstick: NEGATIVE
Protein, ur: NEGATIVE mg/dL

## 2010-09-29 LAB — URINE CULTURE

## 2010-09-29 LAB — HEMOGLOBIN A1C: Hgb A1c MFr Bld: 5.8 % (ref 4.6–6.1)

## 2010-09-29 LAB — TROPONIN I: Troponin I: 0.01 ng/mL (ref 0.00–0.06)

## 2010-09-29 LAB — D-DIMER, QUANTITATIVE: D-Dimer, Quant: <0.22 ug/mL-FEU (ref 0.00–0.48)

## 2010-09-30 ENCOUNTER — Telehealth: Payer: Self-pay | Admitting: *Deleted

## 2010-09-30 NOTE — Telephone Encounter (Signed)
They will be unable to schedule appt until she has called them and talks about a balance she already has there

## 2010-09-30 NOTE — Telephone Encounter (Signed)
Attempted to contact pt but unsuccessful.  Pt has appt on 4.4.12 with Hulen Luster, will forward to MD as reminder to tell patient.

## 2010-10-01 NOTE — Telephone Encounter (Signed)
Would like to get pt to [facility] if possible.  I think I have discussed this in the past with her and she was unable to pay

## 2010-10-06 LAB — DIFFERENTIAL
Basophils Absolute: 0.2 10*3/uL — ABNORMAL HIGH (ref 0.0–0.1)
Basophils Relative: 2 % — ABNORMAL HIGH (ref 0–1)
Eosinophils Absolute: 0.8 10*3/uL — ABNORMAL HIGH (ref 0.0–0.7)
Monocytes Relative: 5 % (ref 3–12)
Neutrophils Relative %: 47 % (ref 43–77)

## 2010-10-06 LAB — CBC
MCHC: 33.5 g/dL (ref 30.0–36.0)
MCV: 82.2 fL (ref 78.0–100.0)
Platelets: 285 10*3/uL (ref 150–400)
RDW: 19 % — ABNORMAL HIGH (ref 11.5–15.5)

## 2010-10-06 LAB — BASIC METABOLIC PANEL
BUN: 16 mg/dL (ref 6–23)
CO2: 26 mEq/L (ref 19–32)
Chloride: 102 mEq/L (ref 96–112)
Creatinine, Ser: 1.14 mg/dL (ref 0.4–1.2)

## 2010-10-08 ENCOUNTER — Other Ambulatory Visit: Payer: Self-pay | Admitting: *Deleted

## 2010-10-08 ENCOUNTER — Ambulatory Visit (INDEPENDENT_AMBULATORY_CARE_PROVIDER_SITE_OTHER): Payer: Medicaid Other | Admitting: Family Medicine

## 2010-10-08 ENCOUNTER — Other Ambulatory Visit (HOSPITAL_COMMUNITY)
Admission: RE | Admit: 2010-10-08 | Discharge: 2010-10-08 | Disposition: A | Discharge status: Home / Self Care | Payer: Medicaid Other | Source: Ambulatory Visit | Attending: Family Medicine | Admitting: Family Medicine

## 2010-10-08 VITALS — BP 150/94 | HR 68 | Temp 98.4°F | Wt 209.0 lb

## 2010-10-08 DIAGNOSIS — I10 Essential (primary) hypertension: Secondary | ICD-10-CM

## 2010-10-08 DIAGNOSIS — E785 Hyperlipidemia, unspecified: Secondary | ICD-10-CM

## 2010-10-08 DIAGNOSIS — R42 Dizziness and giddiness: Secondary | ICD-10-CM

## 2010-10-08 DIAGNOSIS — Z01419 Encounter for gynecological examination (general) (routine) without abnormal findings: Secondary | ICD-10-CM

## 2010-10-08 DIAGNOSIS — Z1231 Encounter for screening mammogram for malignant neoplasm of breast: Secondary | ICD-10-CM

## 2010-10-08 DIAGNOSIS — Z0000 Encounter for general adult medical examination without abnormal findings: Secondary | ICD-10-CM

## 2010-10-08 DIAGNOSIS — Z124 Encounter for screening for malignant neoplasm of cervix: Secondary | ICD-10-CM

## 2010-10-08 MED ORDER — TRAMADOL HCL 50 MG PO TABS: 50.0000 mg | ORAL_TABLET | Freq: Four times a day (QID) | ORAL | Status: DC | PRN

## 2010-10-09 LAB — POCT I-STAT, CHEM 8
BUN: 14 mg/dL (ref 6–23)
Chloride: 106 mEq/L (ref 96–112)
Creatinine, Ser: 1.2 mg/dL (ref 0.4–1.2)
Sodium: 140 mEq/L (ref 135–145)
TCO2: 23 mmol/L (ref 0–100)

## 2010-10-09 LAB — POCT CARDIAC MARKERS
CKMB, poc: <1 ng/mL — ABNORMAL LOW (ref 1.0–8.0)
Myoglobin, poc: 90 ng/mL (ref 12–200)
Troponin i, poc: <0.05 ng/mL (ref 0.00–0.09)

## 2010-10-09 LAB — D-DIMER, QUANTITATIVE: D-Dimer, Quant: 0.3 ug/mL-FEU (ref 0.00–0.48)

## 2010-10-10 LAB — URINALYSIS, ROUTINE W REFLEX MICROSCOPIC
Ketones, ur: 15 mg/dL — AB
Leukocytes, UA: NEGATIVE
Nitrite: NEGATIVE
Protein, ur: 30 mg/dL — AB
Urobilinogen, UA: 1 mg/dL (ref 0.0–1.0)
pH: 5.5 (ref 5.0–8.0)

## 2010-10-10 LAB — URINE MICROSCOPIC-ADD ON

## 2010-10-12 LAB — URINE MICROSCOPIC-ADD ON

## 2010-10-12 LAB — URINALYSIS, ROUTINE W REFLEX MICROSCOPIC
Nitrite: NEGATIVE
Specific Gravity, Urine: 1.028 (ref 1.005–1.030)
Urobilinogen, UA: 0.2 mg/dL (ref 0.0–1.0)
pH: 5.5 (ref 5.0–8.0)

## 2010-10-12 LAB — LIPASE, BLOOD: Lipase: 24 U/L (ref 11–59)

## 2010-10-12 LAB — COMPREHENSIVE METABOLIC PANEL
AST: 18 U/L (ref 0–37)
Albumin: 3.4 g/dL — ABNORMAL LOW (ref 3.5–5.2)
BUN: 9 mg/dL (ref 6–23)
Calcium: 9.1 mg/dL (ref 8.4–10.5)
Chloride: 105 mEq/L (ref 96–112)
Creatinine, Ser: 1.05 mg/dL (ref 0.4–1.2)
GFR calc Af Amer: >60 mL/min (ref >60)
GFR calc non Af Amer: 56 mL/min — ABNORMAL LOW (ref >60)
Total Bilirubin: 0.6 mg/dL (ref 0.3–1.2)

## 2010-10-12 LAB — CBC
HCT: 31.3 % — ABNORMAL LOW (ref 36.0–46.0)
MCHC: 33.1 g/dL (ref 30.0–36.0)
MCV: 80.5 fL (ref 78.0–100.0)
Platelets: 290 10*3/uL (ref 150–400)
RDW: 19.4 % — ABNORMAL HIGH (ref 11.5–15.5)
WBC: 10.1 10*3/uL (ref 4.0–10.5)

## 2010-10-12 LAB — DIFFERENTIAL
Basophils Absolute: 0.1 10*3/uL (ref 0.0–0.1)
Lymphocytes Relative: 40 % (ref 12–46)
Lymphs Abs: 4 10*3/uL (ref 0.7–4.0)
Monocytes Absolute: 0.8 10*3/uL (ref 0.1–1.0)
Neutro Abs: 4.8 10*3/uL (ref 1.7–7.7)

## 2010-10-13 ENCOUNTER — Encounter: Payer: Self-pay | Admitting: Family Medicine

## 2010-10-13 DIAGNOSIS — Z01419 Encounter for gynecological examination (general) (routine) without abnormal findings: Secondary | ICD-10-CM | POA: Insufficient documentation

## 2010-10-13 LAB — URINALYSIS, ROUTINE W REFLEX MICROSCOPIC
Ketones, ur: 15 mg/dL — AB
Protein, ur: 100 mg/dL — AB
Urobilinogen, UA: 1 mg/dL (ref 0.0–1.0)

## 2010-10-13 LAB — CBC
HCT: 33.8 % — ABNORMAL LOW (ref 36.0–46.0)
Hemoglobin: 11.3 g/dL — ABNORMAL LOW (ref 12.0–15.0)
MCV: 80.9 fL (ref 78.0–100.0)
RBC: 4.17 MIL/uL (ref 3.87–5.11)
WBC: 7.1 10*3/uL (ref 4.0–10.5)

## 2010-10-13 LAB — DIFFERENTIAL
Basophils Absolute: 0.1 10*3/uL (ref 0.0–0.1)
Eosinophils Absolute: 0.4 10*3/uL (ref 0.0–0.7)
Eosinophils Relative: 6 % — ABNORMAL HIGH (ref 0–5)
Lymphocytes Relative: 29 % (ref 12–46)

## 2010-10-13 LAB — COMPREHENSIVE METABOLIC PANEL
AST: 25 U/L (ref 0–37)
BUN: 14 mg/dL (ref 6–23)
CO2: 23 mEq/L (ref 19–32)
Chloride: 105 mEq/L (ref 96–112)
Creatinine, Ser: 1.22 mg/dL — ABNORMAL HIGH (ref 0.4–1.2)
GFR calc Af Amer: 57 mL/min — ABNORMAL LOW (ref >60)
GFR calc non Af Amer: 47 mL/min — ABNORMAL LOW (ref >60)
Glucose, Bld: 127 mg/dL — ABNORMAL HIGH (ref 70–99)
Total Bilirubin: 0.4 mg/dL (ref 0.3–1.2)

## 2010-10-13 LAB — LIPASE, BLOOD: Lipase: 31 U/L (ref 11–59)

## 2010-10-13 NOTE — Assessment & Plan Note (Signed)
Taking statin.  Will need to recheck in 3 months.

## 2010-10-13 NOTE — Assessment & Plan Note (Signed)
BP much improved today.  Pt brought in meds. Taking everything except hydralazine.  Will have her add that in and RTC in one month for recheck

## 2010-10-13 NOTE — Progress Notes (Signed)
  Subjective:    Patient ID: Lindsey [name], female    DOB: 08-30-1960, 50 y.o.   MRN: [id number]  HPI HTN- pt taking everything except hydralazine.  No HA, CP, SOB since last visit. HL- avoiding fried foods, not able to exercise much due to balance Well woman-  No d/c odor.  Still with regular periods.  No new partners.   Review of Systems See above    Objective:   Physical Exam    Vital signs reviewed General appearance - alert, well appearing, and in no distress and oriented to person, place, and time Chest - clear to auscultation, no wheezes, rales or rhonchi, symmetric air entry, no tachypnea, retractions or cyanosis Heart - normal rate, regular rhythm, normal S1, S2, no murmurs, rubs, clicks or gallops Abdomen - soft, nontender, nondistended, no masses or organomegaly GYN- external genetalia normal, without lesions.  Vagina normal color, rugations, without discharge.  Cervix normal color without lesions or discharge.     Assessment & Plan:

## 2010-10-13 NOTE — Assessment & Plan Note (Signed)
Balance problems related to stroke.  Have resent PT forms for the neuro rehab PT.

## 2010-10-13 NOTE — Assessment & Plan Note (Signed)
Pt due for pap and mammogram.  Gave form for mammogram. Pap collected today

## 2010-10-14 LAB — CBC
Hemoglobin: 10.6 g/dL — ABNORMAL LOW (ref 12.0–15.0)
Platelets: 274 10*3/uL (ref 150–400)
RDW: 18.2 % — ABNORMAL HIGH (ref 11.5–15.5)

## 2010-10-14 LAB — COMPREHENSIVE METABOLIC PANEL
ALT: 19 U/L (ref 0–35)
Albumin: 3.4 g/dL — ABNORMAL LOW (ref 3.5–5.2)
Alkaline Phosphatase: 82 U/L (ref 39–117)
Potassium: 3.4 mEq/L — ABNORMAL LOW (ref 3.5–5.1)
Sodium: 134 mEq/L — ABNORMAL LOW (ref 135–145)
Total Protein: 7.2 g/dL (ref 6.0–8.3)

## 2010-10-14 LAB — DIFFERENTIAL
Basophils Relative: 1 % (ref 0–1)
Eosinophils Absolute: 0.4 10*3/uL (ref 0.0–0.7)
Monocytes Absolute: 0.3 10*3/uL (ref 0.1–1.0)
Monocytes Relative: 3 % (ref 3–12)

## 2010-10-16 ENCOUNTER — Ambulatory Visit: Payer: Medicaid Other | Attending: Family Medicine | Admitting: Physical Therapy

## 2010-10-16 DIAGNOSIS — M2550 Pain in unspecified joint: Secondary | ICD-10-CM | POA: Insufficient documentation

## 2010-10-16 DIAGNOSIS — R262 Difficulty in walking, not elsewhere classified: Secondary | ICD-10-CM | POA: Insufficient documentation

## 2010-10-16 DIAGNOSIS — IMO0001 Reserved for inherently not codable concepts without codable children: Secondary | ICD-10-CM | POA: Insufficient documentation

## 2010-10-16 DIAGNOSIS — R5381 Other malaise: Secondary | ICD-10-CM | POA: Insufficient documentation

## 2010-10-16 DIAGNOSIS — M6281 Muscle weakness (generalized): Secondary | ICD-10-CM | POA: Insufficient documentation

## 2010-10-16 DIAGNOSIS — Z8673 Personal history of transient ischemic attack (TIA), and cerebral infarction without residual deficits: Secondary | ICD-10-CM | POA: Insufficient documentation

## 2010-10-16 LAB — COMPREHENSIVE METABOLIC PANEL WITH GFR
AST: 19 U/L (ref 0–37)
Albumin: 3.4 g/dL — ABNORMAL LOW (ref 3.5–5.2)
Chloride: 105 meq/L (ref 96–112)
Creatinine, Ser: 1.07 mg/dL (ref 0.4–1.2)
GFR calc Af Amer: >60 mL/min (ref >60)
Potassium: 3.6 meq/L (ref 3.5–5.1)
Total Bilirubin: 0.3 mg/dL (ref 0.3–1.2)
Total Protein: 7 g/dL (ref 6.0–8.3)

## 2010-10-16 LAB — URINALYSIS, ROUTINE W REFLEX MICROSCOPIC
Bilirubin Urine: NEGATIVE
Glucose, UA: NEGATIVE mg/dL
Hgb urine dipstick: NEGATIVE
Ketones, ur: NEGATIVE mg/dL
Nitrite: NEGATIVE
Protein, ur: NEGATIVE mg/dL
Specific Gravity, Urine: 1.019 (ref 1.005–1.030)
Urobilinogen, UA: 0.2 mg/dL (ref 0.0–1.0)
pH: 5.5 (ref 5.0–8.0)

## 2010-10-16 LAB — COMPREHENSIVE METABOLIC PANEL
ALT: 16 U/L (ref 0–35)
Alkaline Phosphatase: 69 U/L (ref 39–117)
BUN: 9 mg/dL (ref 6–23)
CO2: 26 mEq/L (ref 19–32)
Calcium: 9 mg/dL (ref 8.4–10.5)
GFR calc non Af Amer: 55 mL/min — ABNORMAL LOW (ref >60)
Glucose, Bld: 98 mg/dL (ref 70–99)
Sodium: 137 mEq/L (ref 135–145)

## 2010-10-16 LAB — URINE CULTURE: Colony Count: 5000

## 2010-10-21 LAB — DIFFERENTIAL
Basophils Absolute: 0 10*3/uL (ref 0.0–0.1)
Eosinophils Absolute: 0.3 10*3/uL (ref 0.0–0.7)
Lymphs Abs: 3.7 10*3/uL (ref 0.7–4.0)
Monocytes Absolute: 0.4 10*3/uL (ref 0.1–1.0)
Neutro Abs: 11.2 10*3/uL — ABNORMAL HIGH (ref 1.7–7.7)

## 2010-10-21 LAB — PROTIME-INR
INR: 1 (ref 0.00–1.49)
Prothrombin Time: 12.9 seconds (ref 11.6–15.2)

## 2010-10-21 LAB — BASIC METABOLIC PANEL
Calcium: 9.1 mg/dL (ref 8.4–10.5)
Chloride: 107 mEq/L (ref 96–112)
Creatinine, Ser: 1.1 mg/dL (ref 0.4–1.2)
GFR calc Af Amer: >60 mL/min (ref >60)
Sodium: 138 mEq/L (ref 135–145)

## 2010-10-21 LAB — CBC
Hemoglobin: 11 g/dL — ABNORMAL LOW (ref 12.0–15.0)
RBC: 3.91 MIL/uL (ref 3.87–5.11)
WBC: 15.6 10*3/uL — ABNORMAL HIGH (ref 4.0–10.5)

## 2010-10-21 LAB — POCT CARDIAC MARKERS: Myoglobin, poc: 119 ng/mL (ref 12–200)

## 2010-10-22 ENCOUNTER — Ambulatory Visit: Payer: Medicaid Other

## 2010-10-22 ENCOUNTER — Ambulatory Visit: Payer: Medicaid Other | Admitting: Physical Therapy

## 2010-10-23 ENCOUNTER — Ambulatory Visit: Payer: Medicaid Other | Admitting: Physical Therapy

## 2010-10-24 ENCOUNTER — Encounter: Payer: Self-pay | Admitting: Family Medicine

## 2010-10-29 ENCOUNTER — Other Ambulatory Visit: Payer: Self-pay | Admitting: Family Medicine

## 2010-10-29 ENCOUNTER — Ambulatory Visit
Admission: RE | Admit: 2010-10-29 | Discharge: 2010-10-29 | Disposition: A | Discharge status: Home / Self Care | Payer: Medicaid Other | Source: Ambulatory Visit | Attending: Cardiovascular Disease | Admitting: Cardiovascular Disease

## 2010-10-29 ENCOUNTER — Ambulatory Visit: Payer: Medicaid Other | Admitting: Physical Therapy

## 2010-10-29 DIAGNOSIS — Z1231 Encounter for screening mammogram for malignant neoplasm of breast: Secondary | ICD-10-CM

## 2010-10-30 ENCOUNTER — Ambulatory Visit: Payer: Medicaid Other | Admitting: Physical Therapy

## 2010-10-31 ENCOUNTER — Other Ambulatory Visit: Payer: Self-pay | Admitting: *Deleted

## 2010-10-31 ENCOUNTER — Other Ambulatory Visit: Payer: Self-pay | Admitting: Cardiovascular Disease

## 2010-10-31 DIAGNOSIS — R928 Other abnormal and inconclusive findings on diagnostic imaging of breast: Secondary | ICD-10-CM

## 2010-11-05 ENCOUNTER — Ambulatory Visit: Payer: Medicaid Other | Admitting: Physical Therapy

## 2010-11-06 ENCOUNTER — Ambulatory Visit: Payer: Medicaid Other | Admitting: Family Medicine

## 2010-11-06 ENCOUNTER — Ambulatory Visit: Payer: Medicaid Other | Admitting: Physical Therapy

## 2010-11-06 ENCOUNTER — Other Ambulatory Visit: Payer: Medicaid Other

## 2010-11-12 ENCOUNTER — Ambulatory Visit: Payer: Medicaid Other | Admitting: Physical Therapy

## 2010-11-13 ENCOUNTER — Ambulatory Visit: Payer: Medicaid Other | Admitting: Physical Therapy

## 2010-11-18 NOTE — Discharge Summary (Signed)
Lindsey [name], Lindsey [name]                ACCOUNT NO.:  [id number]   MEDICAL RECORD NO.:  [id number]          PATIENT TYPE:  INP   LOCATION:  3735                         FACILITY:  [facility]   PHYSICIAN:  Hillery Aldo, M.D.   DATE OF BIRTH:  10-25-1960   DATE OF ADMISSION:  07/01/2007  DATE OF DISCHARGE:  07/04/2007                               DISCHARGE SUMMARY   PRIMARY CARE PHYSICIAN:  Dr. Billee Cashing.   DISCHARGE DIAGNOSES:  1. 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  2. 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  3. Citrobacter koseri urinary tract infection.  4. Hypertension.  5. Dyslipidemia.  6. Nausea, vomiting, diarrhea, self limited. likely due to viral      gastroenteritis.  7. Degenerative disk disease.   DISCHARGE MEDICATIONS:  1. Labetalol 200 mg b.i.d.  2. Aspirin 81 mg daily.  3. Zocor 20 mg daily.  4. Plavix 75 mg daily.  5. Lyrica 50 mg q.6 h. p.r.n.  6. Vicodin 5/500 1 tablet q. 6 hours p.r.n.  7. Cipro 250 mg b.i.d. x one more day.   CONSULTATIONS:  Dr. Sharene Skeans of neurology.   PROCEDURES AND DIAGNOSTIC STUDIES:  1. Lumbar spine films on July 01, 2007 showed chronic-type changes      of degenerative disk disease at L3-L4.  2. CT scan of the head on July 01, 2007 showed an infarct      involving the right internal capsule and basal ganglia.  It was      most consistent with a subacute infarct, but new compared to the      prior study done on March 10, 2007.  Postoperative changes were      noted in the posterior fossa with occipital craniotomy.  3. MRI/MRA of the brain on July 01, 2007 showed a new development      of a right caudate head, anterior and right lenticular nucleus,      anterior aspect of the anterior limb of the right internal capsule      infarct.  The appearance  was suggestive that this was chronic or      perhaps subacute to chronic in origin, given the dilatation of the      right frontal horn of the lateral ventricular system.  This system      contained a small amount of blood.  There was an old small infarct      of posterior left lenticular nucleus.  There were no acute      infarcts.  There was postsurgical changes status post decompression      of Chiari 1 malformation.  Air-fluid levels were seen in the left      maxillary sinus.  There was some white matter changes.  There were      intracranial atherosclerotic-type changes on the MRA.  4. A transesophageal echocardiogram on July 04, 2007 showed normal  LV and RV function.  There was mild tricuspid regurgitation.  There      was trace mitral regurgitation.  The aortic valve was normal.      There was no embolic source noted.  There was mild to moderate LVH.      The bubble study was negative.   DISCHARGE LABORATORY VALUES:  ANA was negative.  Hypercoagulable panel  was negative.  Anti-DNA test was negative.  RPR was nonreactive.  ESR  was 30.  Lipid profile showed a cholesterol of 137, HDL 29, LDL 89, and  triglycerides of 96.   HOSPITAL COURSE:  By problem:  1. Nausea, vomiting, diarrhea:  The patient's initial presenting      complaint was GI in origin.  She was put on IV fluids and      antiemetics p.r.n., and her symptoms completely resolved.  She      likely had an underlying viral gastroenteritis.  2. Subacute CVA:  The patient underwent a swallowing screen which was      clear.  She initially had some focal deficits in the right lower      extremity.  These completely cleared at 24 hours.  The patient had      a full evaluation for causes of stroke in the young, including a      vasculitis workup and a hypercoagulable state workup.  These are      negative.  Additionally, she underwent a transesophageal      echocardiogram which did not show any evidence of embolic  source.      She was monitored on telemetry and did not have any arrhythmias.      She did have some artifact, which at first glance appeared to be      either ventricular fibrillation or ventricular tachycardia, but      when this was reviewed by the cardiologist, was felt it was merely      artifact.  She also underwent a four-vessel cerebral arteriogram.      The arteriogram showed focal areas of moderate narrowing in the      right MCA subdivision at the origin.  The right ACA A1 segment      distally.  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  Given      the patient's recurrent stroke, strict attention should be paid to      controlling her risk factors, including her hypertension and      dyslipidemia.  She was advised to make sure she takes her aspirin      and Plavix religiously.  She should follow up with her primary care      physician as usual.  3. Hypertension:  The patient's blood pressure has remained well      controlled on labetalol therapy.  4. Dyslipidemia:  The patient's dyslipidemia is controlled on Zocor.  5. Urinary tract infection:  The patient had a urine culture done,      which did grow Citrobacter Koseri.  This organism was sensitive to      Cipro.  The patient has completed 2-1/2 half days of Cipro and will      complete an additional 24 hours of therapy, which should eradicate      her infection.   DISPOSITION:  The patient is medically stable and will be discharged  home.  She should follow up with Dr. Ronne Binning, as usual.      Hillery Aldo, M.D.  Electronically Signed  CR/MEDQ  D:  07/04/2007  T:  07/04/2007  Job:  [id number]   cc:   Lorelle Formosa, M.D.

## 2010-11-18 NOTE — H&P (Signed)
NAMENATHALEE, Lindsey [name]                ACCOUNT NO.:  [id number]   MEDICAL RECORD NO.:  [id number]          PATIENT TYPE:  INP   LOCATION:  3036                         FACILITY:  [facility]   PHYSICIAN:  Bevelyn Buckles. Champey, M.D.DATE OF BIRTH:  December 25, 1960   DATE OF ADMISSION:  02/11/2007  DATE OF DISCHARGE:                              HISTORY & PHYSICAL   REASON FOR ADMISSION:  Admit for transient ischemic attack.   HISTORY OF PRESENT ILLNESS:  The patient is a 50 year old African-  American female who was recently discharged from the hospital on February 05, 2007 after a left internal capsule infarct and received IV  thrombolysis with good recovery.  The patient was discharged home with  outpatient therapy and on Aggrenox, Zocor and aspirin.  The patient  presents today after resting normally after 11:30 a.m. and woke up after  her nap around 1 p.m. and noted that she had left-sided weakness and  numbness and felt very unsteady on her feet.  She denies any vertigo,  falling or loss of consciousness.  Her left- sided weakness lasted about  20 minutes, then gradually resolved and almost completely resolved.  Our  patient just has some slight left-sided numbness.  Last Thursday she had  a stroke which resulted in right-sided weakness.  The patient states  that over last weekend she developed some nausea, vomiting and  headaches, and her Aggrenox was stopped on February 07, 2007 by her primary  care physician.  Those symptoms resolved, and the patient was just on a  baby aspirin.   She denies any other symptoms such as headache, speech changes, vision  changes, swallowing problems, chewing problems, vertigo, falls or loss  of consciousness.   PAST MEDICAL HISTORY:  1. Positive for a stroke.  2. Hypertension.  3. High cholesterol.  4. Chiari malformation.  5. Migraines.  6. IBS.  7. Obesity.  8. Tubal ligation.   CURRENT MEDICATIONS:  Include:  1. Atenolol 50 mg daily.  2. Zocor 20 mg  daily.  3. Baby aspirin.  4. Aggrenox was discontinued on February 07, 2007.   ALLERGIES:  CONTRAST MEDIA.   FAMILY HISTORY:  Positive for heart disease and stroke.   SOCIAL HISTORY:  The patient works at [employer] and [employer]  She denies any tobacco, alcohol or drug use.   REVIEW OF SYSTEMS:  Positive as per HPI.  Review of systems is negative  as per HPI in greater than 7 other organ systems.   PHYSICAL EXAMINATION:  STROKE SCALE:  Zero.  VITAL SIGNS:  Blood pressure 167/92, heart rate 69, respirations 16, and  O2 saturation 99%.  HEENT:  Normocephalic and atraumatic.  Extraocular muscles are intact.  Pupils are equal, round and reactive to light.  NECK:  Supple.  No carotid bruits are heard.  LUNGS:  Clear.  HEART:  Regular.  ABDOMEN: Soft and nontender.  EXTREMITIES:  Good pulses with no edema.  NEURO:  The patient is awake, alert and oriented x3.  Language is  fluent.  Cranial nerves 2 through 12 are grossly intact.  Motor  examination shows 4+ to 5/5 strength and normal tone in all 4  extremities.  No drift is noted.  Sensory examination is within normal  limits to light touch. Reflexes are 1 to 2+ and symmetric.  Cerebellar  function is within normal limits.  Gait was not assessed secondary to  safety.   CT of the head done today showed an old left putamenal infract with no  acute bleeder or infarct.  Stroke scale 0.   LABORATORY DATA:  Sodium 137, potassium 4.9, chloride 108, CO2 is 22,  BUN 13, creatinine 0.92, glucose 90, WBCs  9.9, hemoglobin 12.5, hematocrit 37.0, platelets 329.  PT 12.3.  INR  0.9.  PTT 28.   The results from the last hospitalization include 2D echocardiograms  done on February 04, 2007 that showed an EF of 50 to 60%.  Carotid Dopplers  performed on February 04, 2007 which showed no significant ICA stenosis  bilaterally.  The patient had MRI/MRA of the brain that showed an acute  left posterior putamenal internal capsule infarct with  some small vessel  disease noted.  MRA was essentially unremarkable.  Homocysteine level  was 6.8.  Lipid panel:  Cholesterol 75, LDL 115.   IMPRESSION:  1. This patient is a 50 year old African-American female with      transient left-sided weakness which completely resolved.  She is      not a candidate for TPA given her other symptoms and resolving      recent stroke within the past week.  2. We will admit the patient, observe the patient, and get a limited      MRI scan and look for any new acute stroke.  3. Will start the patient on Plavix 75 mg daily as the patient could      not tolerate Aggrenox and has been off it for the last few days.      We will continue her other home medications and follow her blood      pressure.  The patient has outpatient therapy set up from her last      hospitalization and will consider possible discharge in the morning      if the patient is doing well.  4. The patient had a recent stroke workup, and I do not see the need      to repeat that at this time.  5. I will follow the patient while she is in the hospital.      Bevelyn Buckles. Nash Shearer, M.D.  Electronically Signed     DRC/MEDQ  D:  02/11/2007  T:  02/12/2007  Job:  [id number]

## 2010-11-18 NOTE — H&P (Signed)
Lindsey [name], Lindsey [name]                ACCOUNT NO.:  [id number]   MEDICAL RECORD NO.:  [id number]          PATIENT TYPE:  INP   LOCATION:  3735                         FACILITY:  [facility]   PHYSICIAN:  Hillery Aldo, M.D.   DATE OF BIRTH:  1960-12-19   DATE OF ADMISSION:  07/01/2007  DATE OF DISCHARGE:                              HISTORY & PHYSICAL   PRIMARY CARE PHYSICIAN:  Billee Cashing, M.D.   CHIEF COMPLAINT:  New right-sided weakness.   HISTORY OF PRESENT ILLNESS:  The patient is a 50 year old female with a  past medical history of cerebrovascular disease who presents with a 2-  week history of nausea, vomiting, and diarrhea accompanied by chills and  headaches.  She also has had diminished appetite and a 5-pound weight  loss in the past week.  She visited her primary care physician earlier  in the week who told her that she likely was suffering from a virus and  that it would get better.  Yesterday she was extremely lethargic, and  when she awoke this morning she complained of right-sided weakness that  was greater than the left side.  She notes that she has been feeling  weak all over since this illness began but definitely describes a  difference in the right versus the left as of this morning.  She denies  any frank dysphagia or dysarthria.  She does complain of occasional  double vision but when asked to further describe this she suggests that  it is more of a heavy feeling in her eyes rather than actual true  diplopia.  The patient denies any associated shortness of breath, chest  pain, or other symptoms.   PAST MEDICAL HISTORY:  1. Hypertension with a history of hypertensive urgency.  2. Cerebrovascular accident, left-sided, status post t-PA.  3. Dyslipidemia, treated.  4. Migraine headaches.  5. 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  6. Irritable bowel syndrome.  7. Obesity.  8. Hiatal  hernia/reflux.  9. Status post breast reduction surgery.  10.Status post cesarean section x3.  11.Status post bilateral tubal ligation.  12.Status post posterior C1 laminectomy.   FAMILY HISTORY:  The patient's mother died at age 32 of an MI.  Her  father is in his late 72s or early 58s and she is unaware of any medical  problems that he may suffer from.  She has 10 siblings.  One brother has  had a CVA.  Several of her other siblings have hypertension.   SOCIAL HISTORY:  The patient is divorced.  She works as a Geologist, engineering.  She denies any tobacco, alcohol or drug use.   ALLERGIES:  CONTRAST DYE.   CURRENT MEDICATIONS:  1. Labetalol 200 mg b.i.d.  2. Aspirin 81 mg daily.  3. Zocor 20 mg daily.  4. Plavix 75 mg daily.  5. Lyrica 50 mg p.r.n.  6. Vicodin 5/500 p.r.n.   REVIEW OF SYSTEMS:  As noted in the elements of the HPI above.  Otherwise, no melena or hematochezia but she has  had some dysuria.   PHYSICAL EXAMINATION:  Temperature 98.2, pulse 48, respirations 22,  blood pressure 146/83, O2 saturation 98% on room air.  GENERAL:  Obese female in no acute distress.  HEENT:  Normocephalic, atraumatic.  PERRL.  EOMI.  Visual fields are  full.  Oropharynx is clear.  Tongue is midline.  Palate rises  symmetrically.  NECK:  Supple, no thyromegaly, no lymphadenopathy, no jugular venous  distention.  CHEST:  Lungs clear to auscultation bilaterally with good air movement.  HEART:  Regular rate, rhythm.  No murmurs, rubs, or gallops.  ABDOMEN:  Soft, nontender, nondistended with normoactive bowel sounds.  EXTREMITIES:  No clubbing, edema, cyanosis.  SKIN:  Warm and dry.  No rashes.  NEUROLOGIC:  The patient is alert and oriented x3.  Cranial nerves II-  XII grossly intact.  She does have subtle weakness of the right upper  extremity when compared to the left.  The right lower extremity weakness  is little more pronounced than the upper extremity weakness.   DATA  REVIEW:  Lumbosacral spine films show chronic-type changes of  degenerative disk disease at L3 and L4.   CT scan of the head shows new subacute infarct in the right internal  capsule and basal ganglia.   Laboratory data:  White blood cell count is 9.8, hemoglobin 11.9,  hematocrit 35.7, platelets 341.  Sodium is 138, potassium 4.2, chloride  108, bicarb 24, BUN 14, creatinine 1.4, glucose 92.   ASSESSMENT AND PLAN:  1. Recurrent cerebrovascular accident:  We will admit the patient to      telemetry and recheck an MRI/MRA scan.  She had carotid Dopplers      done back in August with a full stroke evaluation including two-      dimensional echocardiography, homocystine studies, and a fasting      lipid panel.  A 2-D echocardiogram was a limited study but did not      show any obvious embolic source.  She was not hyperhomocysteinemic.      Her lipids were controlled on statin therapy.  Given that this      patient is only 50 years old and this represents a second CVA, I      will consult neurology for help with further workup and management      to help reduce her risk factors and reduce further cerebrovascular      events.  2. Nausea and vomiting:  The patient likely is suffering from a viral      gastroenteritis.  Supportive therapy with IV fluids and antiemetics      p.r.n. should suffice.  3. Hypertension:  Will continue the patient's outpatient treatment      with labetalol as this seems to be controlling her blood pressure.  4. Dyslipidemia:  Will recheck the patient's fasting lipid panel to      ensure she is well controlled on her current Zocor therapy.  5. Prophylaxis:  Will initiate Lovenox for DVT prophylaxis and      Protonix for GI prophylaxis.      Hillery Aldo, M.D.  Electronically Signed     CR/MEDQ  D:  07/01/2007  T:  07/02/2007  Job:  [id number]   cc:   Lorelle Formosa, M.D.

## 2010-11-18 NOTE — Consult Note (Signed)
NAMEJURNIE, Lindsey [name]                ACCOUNT NO.:  [id number]   MEDICAL RECORD NO.:  [id number]          PATIENT TYPE:  INP   LOCATION:  3735                         FACILITY:  [facility]   PHYSICIAN:  Deanna Artis. Hickling, M.D.DATE OF BIRTH:  August 18, 1960   DATE OF CONSULTATION:  07/02/2007  DATE OF DISCHARGE:                                 CONSULTATION   CHIEF COMPLAINT:  Right leg weakness,.   HISTORY OF PRESENT CONDITION:  I was asked by Dr. Darnelle Catalan to see this  patient, a 50 year old woman known to the stroke service from several  previous admissions most recently August July 31 through February 05, 2007.   On that admission the patient had evidence of dizziness and right-sided  numbness.  She was admitted to the hospital and found to have a left  caudal and inferior putaminal lesion.  She also had very severe  hypertension, blood pressure 230/124, which responded nicely to  treatment.  Fasting lipid showed elevated LDL at 117, normal HDL at 42  and then triglycerides 91.  Homocysteine and the hemoglobin A1c were  normal.   The patient was placed on Aggrenox and Zocor and was also placed back on  her antihypertensive medications.   She made very good recovery.  I do not know whether she followed up in  our office.  The office notes sent to [place] just covered the  hospitalization, so I presume that she did not.  She had noninvasive  Doppler studies that were normal.  She also had a 2-D echocardiogram  that I presume was normal.  There was some mild vascular irregularity  that was thought to be related to atherosclerotic changes.  I do not  think that vasculitis can be absolutely ruled out.   I should mention the patient was treated with t-PA in July and responded  to a very nicely in terms of recovery of her symptoms.   The patient has been ill for 2 weeks with a gastroenteritis with nausea,  vomiting, diarrhea, chills, headache, anorexia and generally feeling  weak.   The day prior to  this, the patient was lethargic and awakened this  morning with bilateral lower extremity weakness, right greater than  left, occasional double vision and a heavy feeling in her legs.  She did  not have dysphagia or dysarthria.   PAST MEDICAL HISTORY:  Remarkable for hypertension, dyslipidemia, prior  cerebrovascular accident noted above, migraine headaches.  She also had  a Chiari malformation that was surgically decompressed, irritable bowel  syndrome, obesity.   PAST SURGICAL HISTORY:  Past surgical history includes breast reduction,  bilateral tubal ligation, cesarean section, cervical  laminectomy and  suboccipital craniotomy for decompression of her Chiari malformation.   FAMILY HISTORY:  Mother died of myocardial infarction at age 35.  Father  is alive at age 65.  His physical health is unknown.  Brother had a  cerebrovascular accident.  There are 9 other siblings, some with  hypertension, all others in good health.   SOCIAL HISTORY:  The patient is divorced.  She does not use tobacco,  alcohol or  drugs.  She works as a Lawyer.   ALLERGIES:  DRUG ALLERGIES IV CONTRAST DYE.   MEDICATIONS:  Labetalol 200 mg twice daily, aspirin 81 mg daily, Zocor  20 mg daily, Plavix 75 mg daily, Lyrica 50 mg p.r.n.  questionable,  Vicodin 5/500 p.r.n.   REVIEW OF SYSTEMS:  The patient says that she lost 5 pounds of weight in  the past week.  She has not had chest pain, shortness of breath and  hematuria; she has had dysuria.   On presentation, the patient had right lower extremity weakness and  otherwise nonfocal examination.  Her blood pressure was 146/83.   I was asked by Dr. Darnelle Catalan see her to determine what further workup for  treatment was needed for this young woman.  I note in looking at my  records that she has not had a workup for treatable causes of stroke in  the young nor a workup for vasculitis.  She has not had a  transesophageal echocardiogram nor has she had a cerebral  catheter  angiogram.  All these would be appropriate evaluations for a woman so  young.   Her MRI scan is quite interesting.  It shows evidence of a subacute  lesion in the right head of the caudate internal capsule that is of  mixed quality both having hemorrhagic and nonhemorrhagic components.  It  also shows a series of lesions that are cortical in nature and involve  the frontal and frontopolar regions bilaterally rather symmetrically  distributed.  These are linear in nature and do not seem artifactual to  me, but I do not see them on the ADC of the ADC exponential map.  I can  see her old left putaminal the lesion.  The patient does not have any  other remote lesions and no other acute lesions.  Her vessels appear to  be without evidence of occlusion and widely patent.  Again there was  some irregularity of the vessels but nothing that truly suggested a  vasculitis.   PHYSICAL EXAMINATION:  GENERAL:  On examination today this is an obese  but otherwise well-developed, right-handed woman no distress.  VITAL SIGNS:  Temperature 98.6, blood pressure 114/72, resting pulse 74,  respirations 18, oxygen saturation 96% on room air.  HEENT:  Ear, nose, throat no bruits or meningismus.  LUNGS:  Clear.  HEART:  No murmurs.  Pulses normal.  ABDOMEN:  Soft.  Bowel sounds normal.  No hepatosplenomegaly.  EXTREMITIES:  Were normal.  NEUROLOGIC:  Mental status:  The patient is awake and alert.  Cranial nerves:  Round reactive pupils.  Visual fields full.  Extraocular movements full.  Symmetric facial strength, midline tongue  and uvula.  Air conduction greater than bone conduction bilaterally.  Motor examination:  Normal strength and mass.  Good fine motor  movements.  No pronator drift.  Sensation intact to cold, vibration,  stereognosis.  There is no hemisensory, no peripheral polyneuropathy and  good stereoagnosis.  Cerebellar examination:  Good finger-to-nose, rapid  fine movements.  Gait  was stable.  Deep tendon reflexes were absent.  The patient had bilateral flexor plantar responses.   IMPRESSION:  1. 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  This could very      well be thrombotic. (434.01)  2. The appearance of several new cortical surface lesions that could      be if real, are embolic or vasculitic.  3. The magnetic resonance imaging scan certainly does not match up  with a clinical symptoms of the patient which has since cleared.   RECOMMENDATIONS:  1. Workup for treatable causes of stroke in the young and also for      vasculitis.  2. We need to consider a transesophageal echocardiogram.  This will      take planning and should be called in this weekend and will but      will not be done until Monday at the earliest.  3. We will consider also a catheter angiogram that might be performed      late on Monday and looking in this case looking for vasculitis.      Believe these should be done as an inpatient even thought the      patient has recovered, this because she is so young this workup      needs to be done.   Appreciate the opportunity to participate in her care.      Deanna Artis. Sharene Skeans, M.D.  Electronically Signed     [facility]/MEDQ  D:  07/02/2007  T:  07/02/2007  Job:  [id number]   cc:   Lorelle Formosa, M.D.  Hillery Aldo, M.D.

## 2010-11-18 NOTE — Discharge Summary (Signed)
Lindsey [name], Lindsey [name]                ACCOUNT NO.:  [id number]   MEDICAL RECORD NO.:  [id number]          PATIENT TYPE:  INP   LOCATION:  2009                         FACILITY:  [facility]   PHYSICIAN:  Hillery Aldo, M.D.   DATE OF BIRTH:  Aug 27, 1960   DATE OF ADMISSION:  03/10/2007  DATE OF DISCHARGE:  03/12/2007                               DISCHARGE SUMMARY   PRIMARY CARE PHYSICIAN:  Dr. Billee Cashing.   NEUROLOGIST:  Dr. Deneen Harts.   DISCHARGE DIAGNOSES:  1. Left upper extremity paraesthesia secondary to nerve compressions,      status post MRI of the cervical spine showing a small left C6-C7      protrusion.  2. History of cerebrovascular disease.  3. Hypertensive urgency.  4. Hyperlipidemia.  5. Arnold-Chiari malformation.  6. History of migraine headaches.  7. History of irritable bowel syndrome.  8. Obesity.   DISCHARGE MEDICATIONS:  1. Labetalol 200 mg b.i.d.  2. Aspirin 81 mg daily.  3. Zocor 20 mg daily.  4. Plavix 75 mg daily.  5. Lyrica 50 mg t.i.d. p.r.n. nerve pain.  6. Vicodin 5/500 1 to 2 tabs q.6 hours p.r.n. neck pain.   CONSULTATIONS:  None.   BRIEF ADMISSION HPI:  The patient is a 50 year old female who caries a  diagnosis of cerebrovascular disease with CVA back in July, who  presented at that time with right sided numbness and weakness, which  resolved status post PPA.  She then had a recent admission for some left  sided symptoms, which resolved without any specific intervention.  She  had a complete stroke evaluation during that admission on February 11, 2007  through February 13, 2007.  She now presents with recurrent left sided  symptoms, including numbness and paresthesias to her left upper  extremity.  She was admitted for further evaluation and observation.  For the full details, please see the dictated note done by Dr. Angelena Sole.   PROCEDURES AND DIAGNOSTIC STUDIES:  1. CT scan of the head on March 10, 2007, showed no acute  intracranial abnormalities.  Suboccipital craniectomy was noted.  2. MRI of the cervical spine on March 11, 2007, showed a small left      paracentral C6-C7 protrusion.   HOSPITAL COURSE:  1. Left sided paresthesias:  The patient's left sided paresthesias      were accompanied by neck discomfort.  This prompted [place] to pursue an      evaluation of her cervical, which showed the abnormalities as noted      above.  It is likely that her paresthesias are related to nerve      compression and do not represent  any recurring transient ischemic      attack.  The patient was reassured and told to follow up with her      primary care physician, Dr. Ronne Binning, who can refer her for      neurosurgical evaluation as indicated.  We will try her on Lyrica      to see if we can control her symptoms conservatively.  2. Cerebrovascular disease:  The patient  has a known history of a      stroke in the past.  She remained on aspirin and Plavix, and should      continue with aggressive risk reduction  measures.  3. Hypertensive urgency:  The patient's blood pressure was well      controlled with changing her regimen to labetalol 200 mg b.i.d. She      will be discharged on this regimen.  4. Hyperlipidemia:  The patient is continued on her usual statin      therapy.   DISPOSITION:  Home.  The patient is medically stable for discharge home.  She is to follow up with Dr. Ronne Binning in 1 to 2 weeks.  If her symptoms  are bothersome or not controlled with Lyrica, Dr. Ronne Binning can refer her  for a neurosurgical evaluation.      Hillery Aldo, M.D.  Electronically Signed     CR/MEDQ  D:  03/12/2007  T:  03/13/2007  Job:  [id number]   cc:   Lorelle Formosa, M.D.  Bevelyn Buckles. Nash Shearer, M.D.

## 2010-11-18 NOTE — Discharge Summary (Signed)
[name], [name]                ACCOUNT NO.:  [id number]   MEDICAL RECORD NO.:  [id number]          PATIENT TYPE:  INP   LOCATION:  3108                         FACILITY:  [facility]   PHYSICIAN:  Pramod P. [name], MD    DATE OF BIRTH:  1961-04-16   DATE OF ADMISSION:  02/03/2007  DATE OF DISCHARGE:  02/05/2007                               DISCHARGE SUMMARY   ADMISSION DIAGNOSIS:  Stroke.   DISCHARGE DIAGNOSES:  1. 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  2. Hypertension.  3. Hyperlipidemia.  4. Obesity.   HOSPITAL COURSE:  Lindsey [name] is a 50 year old pleasant African  American lady who was at work and developed sudden onset of dizziness  right-sided weakness and hand paresthesias around 8:20 p.m. on the day  of admission.  She reached [facility] emergency room within half an hour  and a code stroke was called at 2100.  Dr. Anner Crete saw the patient at 2130  and noted some right-sided weakness with sensory loss.  On the NIH  stroke scale, she scored 4.  The patient qualified for thrombolysis with  IV TPA.  Initial blood pressure was 230/127 by but after treatment it  came down to 168/92.  She was given full dose IV TPA uneventfully.  She  was admitted to the stroke service.  She was kept on telemetry  monitoring which did not reveal cardiac arrhythmias.  She was in the  neurological intensive care unit where blood pressure was strictly  controlled.  She showed significant neurological improvement, and the  next day her strength was back to normal.  She had only minor  paresthesias in the hand.  Rest of the stroke workup included a 2-D  echocardiogram which showed normal ejection fraction of 50-55% without  any obvious cardiac source of embolism.  Carotid ultrasound showed no  significant stenosis of the extracranial vessels.  Fasting lipid profile  showed total cholesterol 175, LDL was elevated at  117, HDL was 42 to his  asthma 91.  Homocystine and hemoglobin A1c were both normal.  Urine drug  screen was negative.  The patient's blood pressure was initially high  but required a Cardene drip but subsequently came down.  She was started  on Zocor for her mild hyperlipidemia and Aggrenox for secondary stroke  prevention.  She was seen on consultation by speech, physical and  occupational therapy and felt to be a candidate for outpatient physical  and occupational therapy.  She was stable at the time of discharge and  NIH stroke scale was 41.  She had only mild finger-to-nose dysmetria on  the right with some paresthesia on the right hand.  The patient was  advised to follow-up with the primary physician, Lindsey [name] in 2  weeks and with Dr. Pearlean [name] in 2 months.  She was also considered for  participation in [employer] stroke prevention trial.  The patient showed some  interest.  She was advised to call the office for more information.   DISCHARGE MEDICATIONS:  1. Aggrenox one capsule daily for 2 weeks to be increased to twice a      day.  2. Aspirin 81 mg a day for 2 weeks and then to be stopped.  3. Zocor 20 mg once a day.  4. The patient did not remember her blood pressure medications and she      was advised to resume them and follow-up with the primary physician      for adjustments.   DISCHARGE INSTRUCTIONS:  Arrangements were made for outpatient physical  and occupational therapy.  The patient was advised not to return to work  until next Friday, August 8.           ______________________________  Lindsey Schlein. [name], MD     PPS/MEDQ  D:  02/05/2007  T:  02/06/2007  Job:  [id number]   cc:   Lindsey [name], M.D.

## 2010-11-18 NOTE — H&P (Signed)
Lindsey [name], Lindsey [name]                ACCOUNT NO.:  [id number]   MEDICAL RECORD NO.:  [id number]          PATIENT TYPE:  INP   LOCATION:  3108                         FACILITY:  [facility]   PHYSICIAN:  Casimiro Needle L. Reynolds, M.D.DATE OF BIRTH:  1961/06/01   DATE OF ADMISSION:  02/03/2007  DATE OF DISCHARGE:                              HISTORY & PHYSICAL   CHIEF COMPLAINT:  Right sided numbness and weakness.   HISTORY OF PRESENT ILLNESS:  This is the initial stroke service  admission and one of several [facility] admissions for this 50-  year-old woman with a past medical history which includes hypertension  and migraines.  The patient was at work this evening, and took a break  around 8:20 p.m.  She was out standing by her car, and after about 10  minutes she felt a sudden onset of dizziness.  She felt that her right  side was numb.  She was having difficulty standing up straight.  EMS was  alerted and brought her immediately to the emergency department.  A code  stroke was called in route.  She arrived to the emergency room shortly  after 9 p.m., had a CT, and was assessed by me at approximately 2130.  She reports having a headache this morning, for which she took Tylenol.  She is not having a headache at this time.  She denies having any  symptoms like this before.  Blood pressure was 203/127 on first  admission, improved after intravenous labetalol and nicardipine.   PAST MEDICAL HISTORY:  Hypertension, under poor control.  She is on  medications, but does not recall unclear exactly what.  She has a  history of migraines, for which she takes Compazine p.r.n.  She has had  Chiari malformations in the past, and has had two surgeries by Dr.  Franky Macho.  She has other medical problems, including irritable bowel  syndrome and morbid obesity.   MEDICATIONS:  Two unknown blood pressure medications, and aspirin daily.   ALLERGIES:  She reports an allergy to CONTRAST MEDIA, but no drug  allergies.   FAMILY HISTORY:  Remarkable for hypertension.  Her brother had a stroke.  Her mother died at 11 of a presumed MI.   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  Has no history of tobacco or illicit drug  use.   REVIEW OF SYSTEMS:  She does report a recent headache and neck pain, as  well as some shortness of breath this evening, but no associated chest  pain.  Otherwise, full 10-system  review of systems is negative except  as outlined in the written HPI and the admission nursing record.   PHYSICAL EXAMINATION:  VITAL SIGNS:  Temperature 100.8, blood pressure  163/92, pulse 71, respirations 31, O2 sat 100% on 3 liters O2 nasal  cannula, weight 185 pounds.  GENERAL:  This is an anxious-appearing, obese woman, supine in the  hospital bed in no acute distress.  HEENT:  Head:  Cranium is normocephalic and atraumatic.  Oropharynx is  benign.  NECK:  Supple without carotid bruits.  CHEST:  Clear to auscultation bilaterally.  HEART:  Regular rate and rhythm without murmurs.  ABDOMEN:  Soft, normoactive bowel sounds.  EXTREMITIES:  No edema, 2+ pulses.  NEUROLOGIC:  Mental status:  She is awake and alert.  She appears  anxious.  Her speech is fluent and dysarthric.  She is able to name  objects and repeat phrases without difficulty.  Cranial nerves:  Pupils  are 3 mm, briskly reactive to 2.  Visual fields are full.  Extraocular  movements are full.  She has a slight right facial droop.  Her tongue  and pallet move symmetrically.  Motor:  She has slight weakness in the  intrinsic muscles of the right hand, and a slight drift of the right  upper and lower extremities.  Sensory:  She reports a decreased pinprick  sensation to the right upper and lower extremity compared to the left.  Reflexes:  2+ and symmetric.  Toes are downgoing bilaterally.  Cerebellar:  Finger-to-nose and heel-to-shin are performed adequately.  NIHSS  score is 4.   LABORATORY REVIEW:  CBC:  White count 12.1, hemoglobin 12.2, platelets  241,000.  CMET is unremarkable.  Coags are normal.  CT of the head  demonstrates no acute findings.  CK is slightly elevated at 193, but no  evidence of acute cardiac injury.   IMPRESSION:  1. Acute subcortical left branch stroke.  Primary risk factors:      Hypertension, but her blood pressure is now controlled on      intravenous nicardipine.  2. Fever and mild leukocytosis of uncertain etiology.   PLAN:  She is receiving intravenous TPA per protocol, and then will be  transferred to the ICU for routine post TPA care.  She will need a  workup, including MRI, MRA, carotid and transcranial Dopplers, and 2-D  echocardiogram.  We will need to restart aggressive antihypertensive  therapy once her TPA window has passed.  We will also check a chest x-  ray and a urinalysis, looking for possible sources of infection given  the fever and leukocytosis.  Stroke service to follow.      Lindsey [name], M.D.  Electronically Signed     MLR/MEDQ  D:  02/03/2007  T:  02/04/2007  Job:  [id number]

## 2010-11-18 NOTE — H&P (Signed)
[name], Lindsey                ACCOUNT NO.:  [id number]   MEDICAL RECORD NO.:  [id number]          PATIENT TYPE:  INP   LOCATION:  2009                         FACILITY:  [facility]   PHYSICIAN:  Hettie Holstein, D.O.    DATE OF BIRTH:  [birth year]   DATE OF ADMISSION:  03/10/2007  DATE OF DISCHARGE:                              HISTORY & PHYSICAL   PRIMARY CARE PHYSICIAN:  Lorelle Formosa, M.D.   NEUROLOGIST:  Bevelyn Buckles. Champey, M.D.   CHIEF COMPLAINT:  Left-sided increasing numbness and weakness.   HISTORY OF PRESENT ILLNESS:  [name] is a 50 year old female  recently discharged from the neurology service with a TIA.  She had a  stroke back on February 03, 2007 which had right-sided numbness and weakness  which resolved status post TPA.  In any event, she has had some  intolerances to some of her medications.  Her Aggrenox was discontinued  and she has been on Plavix.  In any event, she woke up this morning and  stated that she felt some numbness in her left arm, much more so than  usual and weakness.  She did not have ambulatory difficulty at home.  She remained at home.  She did not seek attention as she thought taking  her blood pressure medications would help.  This persisted and she  presented to the emergency department.  Her stroke evaluation earlier  this month included a 2-D echocardiogram which revealed a normal  ejection fraction of 50 to 55% and carotid ultrasound which showed no  significant stenosis of the extracranial vessels.  Her fasting lipid  profile at the time revealed an LDL of 117 and homocysteine level was  within normal limits.  She had some problems with hypertension during  the course.  In any event, she was ultimately discharged on Aggrenox and  aspirin, but she was unable to tolerate Aggrenox, and this was  discontinued.  In any event, in the emergency department she does have  some residual numbness, but her strength is intact in the emergency  department.  However, her blood pressure is quite high.  Will admit her  for observation and optimize her antihypertensive control.  Will  initiate labetalol at this time and ambulate her to assure that she is  not having progressive symptoms.  We will consider a repeat MRI if her  symptoms persist or recur.   PAST MEDICAL HISTORY:  1. As noted above, history of  left-sided infarct status post TPA      February 05, 2007 with resolved right-sided weakness.  2. Hypertension.  3. Hypercholesterolemia.  4. Chiari malformation.  5. History of migraines.  6. Irritable bowel syndrome.  7. Obesity.  8. Bilateral tubal ligation.   MEDICATIONS:  1. Plavix 75 mg daily.  2. Hydrochlorothiazide 25 mg daily.  3. Compazine for headaches.  4. Zocor 20 mg daily.  5. Atenolol 75 mg daily.   ALLERGIES:  No known drug allergies.  There was a mention of a dye  allergy in one of her old medical records.   FAMILY/SOCIAL HISTORY:  Her mother died with an MI at age 53.  She is  unaware of her father's medical history.  Please refer to recent history  and physical dictated just a couple of weeks ago.   REVIEW OF SYSTEMS:  She had been in her usual state of health.  She does  describe persistent left-sided intermittent tingling and numbness which  has persisted really since her last TIA where she had seen neurologist,  Dr. Nash Shearer.  In any event, other review is unremarkable.   LABORATORY DATA:  In the emergency department her sodium was 142,  potassium 3.8, BUN 11, creatinine 1.0, glucose 97, urinalysis negative.  Coronary markers negative.  WBC 10.1, hemoglobin 11.7, platelet count  306, MCV 87.  CT scan of her head revealed no acute findings.   ASSESSMENT:  1. Transient ischemic attack with history of cerebrovascular disease.  2. Hypertensive urgency.  3. Hyperlipidemia.  4. Obesity.   PLAN:  At this time we are going to admit [name] for 23-hour  observation.  Will continue her home  medications.  Will follow her  clinical course.  Will treat her hypertensive urgency with IV labetalol  and will consider discharging her on up-titrating doses of labetalol for  optimal management of her medical risk factors for stroke.  If she does  have recurrent or persistent symptoms, will consider performing an MRI  scan to assure that she has not suffered an acute stroke.  She does  appear to be neurologically intact in the emergency department.  Will  follow her clinical course and continue Plavix and reassess in the a.m.      Hettie Holstein, D.O.  Electronically Signed     ESS/MEDQ  D:  03/11/2007  T:  03/11/2007  Job:  [id number]   cc:   Estanislado Pandy, MD  Marolyn Hammock. Thad Ranger, M.D.  Lorelle Formosa, M.D.

## 2010-11-18 NOTE — Discharge Summary (Signed)
Lindsey [name], Lindsey [name]                ACCOUNT NO.:  [id number]   MEDICAL RECORD NO.:  [id number]          PATIENT TYPE:  INP   LOCATION:  3036                         FACILITY:  [facility]   PHYSICIAN:  Bevelyn Buckles. Champey, M.D.DATE OF BIRTH:  1961/04/06   DATE OF ADMISSION:  02/11/2007  DATE OF DISCHARGE:  02/13/2007                               DISCHARGE SUMMARY   REASON FOR ADMISSION:  Transient ischemic attack.   SECONDARY DIAGNOSES:  1. Hypercholesterolemia.  2. Hypertension and stroke.   MEDICATIONS ON DISCHARGE:  1. Zocor 20 mg one tablet p.o. once per day.  2. Plavix 75 mg one tablet p.o. once per day.  3. Atenolol 75 mg p.o. once per day.   HISTORY OF PRESENT ILLNESS:  Please see admission history and physical  for full details of admit note.  The patient was admitted for transient  episode of left-sided numbness and weakness which completely resolved  while in the emergency room.  She was admitted for a limited work-up as  the patient was recently admitted for a left hemispheric stroke one week  ago.   HOSPITAL COURSE:  Repeat MRI scan did not show any new acute stroke, did  show the old left lenticular nucleus infarct.  There was narrowing in  the right ACA and MCA branches noted.  The patient was off her Aggrenox  that she was discharged on, as she was having side effects.  She was  started on Plavix and continued on her Zocor and atenolol.  Her blood  pressure seemed to be slightly elevated throughout the hospitalization  and she was observed for one day.  After increasing her atenolol, her  blood pressures came down.  Upon discharge her blood pressure was 145/86  to 152/92.  The patient had an uncomplicated hospital stay.   The patient was discharged home and told to follow up with her primary  care physician within one week's time, and with Dr. [name] within two  months.  The patient was discharged home in stable condition.  She was  told to call the office with any  questions or concerns.      Bevelyn Buckles. Nash Shearer, M.D.  Electronically Signed     DRC/MEDQ  D:  02/13/2007  T:  02/13/2007  Job:  [id number]

## 2010-11-19 ENCOUNTER — Ambulatory Visit: Payer: Medicaid Other | Admitting: Physical Therapy

## 2010-11-20 ENCOUNTER — Ambulatory Visit: Payer: Medicaid Other | Admitting: Physical Therapy

## 2010-11-21 NOTE — H&P (Signed)
[place]. [facility]  Patient:    Lindsey [name], Lindsey [name] Visit Number: [id number] MRN: [id number]          Service Type: Attending:  Ronaldo Miyamoto L. Franky Macho, M.D. Dictated by:   Mena Goes. Franky Macho, M.D. Proc. Date: 06/06/01 Adm. Date:  06/06/01                           History and Physical  HISTORY OF PRESENT ILLNESS:  Lindsey [name] is a well-known patient of mine who is being admitted for the third time after undergoing a Chiari decompression. She was recently discharged last week, Wednesday, as a result of a wound infection that she had developed after her Chiari decompression.  She had also been readmitted two to three days after her initial discharge because she was vomiting and was dehydrated.  Lindsey [name] came in to my office today stating that she was dehydrated and could not hold down any foods.  She attributed this to her antibiotics. Unfortunately, the Rifampin and ciprofloxacin which I had prescribed for her, she did not take except one dose of ciprofloxacin on Wednesday.  She has not taken any medication prescribed by me since that time and she still has had persistent nausea and vomiting.  She was admitted only because she had the wound infection last time after undergoing an open debridement.  I gave her IV antibiotics and stressed to her the importance of taking these antibiotics for the wound infection.  Lindsey [name] has not done that, nor did she contact the office to inform anyone that she was unable to hold down any food and that she was unable to take any medication.  Today on exam, her wound is flat.  It is clean.  It is dry.  It is not tender. It was draining at dc, but now has closed.  I used wire to close the wound and I will let the wire stay in.  She seems to be well-hydrated.  Mucous membranes are moist.  She does not appear to be in any distress in the office.  Lindsey [name] essentially asked me to readmit her for rehydration as she said she was  unable to get this done.  Because of that, I will readmit her for IV hydration.  PHYSICAL EXAMINATION:  GENERAL APPEARANCE:  She is alert and oriented x 4 and answering all questions appropriately.  Memory and fund of knowledge is normal.  HEENT:  Pupils are equal, round and reactive to light.  Full extraocular movements.  Tongue and uvula in midline.  NEUROLOGICAL:  Shoulder shrug is normal.  There is 5/5 strength in the upper and lower extremities.  Normal gait. Dictated by:   Mena Goes. Franky Macho, M.D. Attending:  Mena Goes. Franky Macho, M.D. DD:  06/06/01 TD:  06/06/01 Job: [id number]

## 2010-11-21 NOTE — H&P (Signed)
Lindsey [name], Lindsey [name]                ACCOUNT NO.:  [id number]   MEDICAL RECORD NO.:  [id number]          PATIENT TYPE:  OBV   LOCATION:  1824                         FACILITY:  [facility]   PHYSICIAN:  Lonia Blood, M.D.       DATE OF BIRTH:  July 13, 1960   DATE OF ADMISSION:  07/21/2005  DATE OF DISCHARGE:                                HISTORY & PHYSICAL   CHIEF COMPLAINT:  Chest pain.   HISTORY OF PRESENT ILLNESS:  Lindsey [name] is a 50 year old African-American  woman with history of hypertension and obesity who presents to [facility] Room for two-day history of worsening left-sided chest  pain radiating to the left upper extremity with some associated left hand  numbness. She reports that her pain is worse with exercise and last a few  minutes. 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 Today, it actually lasted longer and was  associated also with some sweating. The patient currently takes no  medications. She has allergy to CONTRAST DYE.   PAST MEDICAL HISTORY:  1.  Significant for hiatal hernia.  2.  Gastroesophageal reflux disease.  3.  Hypertension.  4.  Irritable bowel syndrome.  5.  Obesity.  6.  Three cesarean sections.  7.  Chiari malformations, status post neurosurgery x2.   SOCIAL HISTORY:  [name] does not smoke. Does not drink alcohol. 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 She has three children.   FAMILY HISTORY:  Mother died at age 52 with a MI. Father is alive at age 26,  unknown history. She has 10 siblings, none of them had a heart attack but  there is some hypertension present.   REVIEW OF SYSTEMS:  As per HPI. All other systems are negative.   PHYSICAL EXAMINATION:  Upon admission shows a temperature of 98.2, pulse 82,  respirations 20, blood pressure 177/96. Saturation 98% on room air. General  appearance shows Lindsey [name] in no acute distress. Alert and oriented to  place,  person and time. Well-developed, well-nourished, mildly obese. Her  head is normocephalic and atraumatic. Eyes have pupils are equal, round, and  reactive to light and accommodation. Extraocular movements intact. She has  muddy sclerae. Throat is clear without erythema. There are some missing  teeth. Neck is supple without JVD. No carotid bruits. Chest is clear to  auscultation bilaterally without wheezes, rhonchi, or crackles. Heart has a  regular rate and rhythm without murmurs, gallops, or rubs. There is no  palpable lymphadenopathy. Abdomen is soft, nontender, and nondistended.  Bowel sounds are present. Skin shows her skin is warm and dry. She has  acanthosis nigricans on the back. Extremities have no edema.   LABORATORY DATA:  Sodium is 139, potassium 3.6, chloride 109, BUN 12,  creatinine 1.1, glucose 87. Myoglobin 139. CK-MB 3.7. Troponin I less than  0.05. EKG shows a normal sinus rhythm with flat T-waves in the inferolateral  leads. Chest x-ray shows no acute disease. White blood cell count is 7.7,  hemoglobin 13.2,  platelet count 248,000.   ASSESSMENT/PLAN:  1.  Chest pain:  Differential diagnosis includes angina versus esophageal      spasm versus chest wall pain, versus pain associated with increased      pulmonary vascular resistance from the sleep apnea. I do not suspect at      this point a pulmonary embolus. Plan is to admit the patient to a      telemetry bed. Cycle three sets of cardiac enzymes. Start her on empiric      proton pump inhibitors. The patient will be started also on beta      blocker, intravenous nitroglycerin and aspirin. If her troponin I      becomes positive, then we will start intravenous heparin and we will      consult Dr. Algie Coffer, the patient's cardiologist.  2.  Hypertension:  Discussed with the patient the importance of being      complaint and taking her medication. She will be started on a beta      blocker plus diuretic using Diazide.  3.   Deep vein thrombosis prophylaxis will be done using Lovenox.      Lonia Blood, M.D.  Electronically Signed     SL/MEDQ  D:  07/22/2005  T:  07/22/2005  Job:  [id number]   cc:   Lorelle Formosa, M.D.  Fax: [phone]

## 2010-11-21 NOTE — Discharge Summary (Signed)
Lindsey [name], Lindsey [name]                ACCOUNT NO.:  [id number]   MEDICAL RECORD NO.:  [id number]          PATIENT TYPE:  INP   LOCATION:  6527                         FACILITY:  [facility]   PHYSICIAN:  Jonna L. Robb Matar, M.D.DATE OF BIRTH:  1960-12-08   DATE OF ADMISSION:  07/21/2005  DATE OF DISCHARGE:  07/24/2005                                 DISCHARGE SUMMARY   Primary care physician, Lorelle Formosa, M.D.  Cardiologist, Ricki Rodriguez, M.D.   FINAL DIAGNOSES:  1.  Noncardiac chest pain.  2.  Uncontrolled hypertension.  3.  Gastroesophageal reflux disease.  4.  Irritable bowel syndrome.  5.  History of Budd-Chiari malformation, status post neurosurgery x2.   ALLERGIES:  CONTRAST DYE.   CODE STATUS:  Full.   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   Physical exam shows obesity, muddy sclerae, but otherwise was normal.  Cardiac enzymes, EKG, chest x-ray, white count, chemistries were all normal.  BUN 12, creatinine 1.1.   HOSPITAL COURSE:  Patient ruled out for MI and underwent  Persantine  Cardiolite stress test, which suggested some reversible anterior wall  ischemia.  She had a cardiac catheterization done by Dr. Algie Coffer on January  18 that was completely normal, and there was a hyperdynamic EF of 70% and  blood pressures that were elevated.   The patient will be discharged on:  1.  Lisinopril/hydrochlorothiazide 20/25 mg daily.  2.  Atenolol 50 mg daily.   She is to see Dr. Algie Coffer in two weeks.  She has been instructions to avoid  salt and salty foods and to shower, not bathe, no lifting or driving for  another two days.      Jonna L. Robb Matar, M.D.  Electronically Signed     JLB/MEDQ  D:  07/24/2005  T:  07/24/2005  Job:  [id number]   cc:   Lorelle Formosa, M.D.  Fax:  [phone]   Ricki Rodriguez, M.D.  Fax: [phone]

## 2010-11-21 NOTE — Discharge Summary (Signed)
[place]. [facility]  Patient:    Lindsey [name], RECKTENWALD Visit Number: [id number] MRN: [id number]          Service Type: MED Location: 3000 3001 [id number] Attending Physician:  Coletta Memos Dictated by:   Mena Goes. Franky Macho, M.D. Admit Date:  05/27/2001 Discharge Date: 06/01/2001                             Discharge Summary  HISTORY OF PRESENT ILLNESS AND HOSPITAL COURSE:  The patient was admitted May 27, 2001 after undergoing a debridement of an infected wound.  She underwent Chiari decompression on April 13, 2001 and had some problems with purulent drainage.  I then made a decision after she had a persistent fever that she should undergo an open debridement.  When I took her to the operating room what I found was a large pocket of pus throughout the entirety of the wound.  That was drained and she was pulse lavaged with an antibiotic solution for 2 L worth of lavaging.  She also had 1 L worth of saline used for lavage. Postoperatively, she was closed with wire so that the wire could stay in as long as necessary.  She has had some drainage though it has been nonpurulent since the operation.  She has been afebrile.  I am going to send her home for a 1 months course of rifampin and ciprofloxacin.  She had already been given 2 weeks of Keflex without effect.  Staphylococcus aureus was the organism identified.  Neurologically, she has never had any problems and she has not had any problems.  She is being discharged home and will return to see me this coming Monday, June 06, 2001. Dictated by:   Mena Goes. Franky Macho, M.D. Attending Physician:  Coletta Memos DD:  06/01/01 TD:  06/02/01 Job: [id number]

## 2010-11-21 NOTE — Op Note (Signed)
[place]. [facility]  Patient:    Lindsey [name], Lindsey [name]                       MRN: [id number] Proc. Date: 10/02/99 Adm. Date:  [id number] Attending:  Lindaann Slough                           Operative Report  PREOPERATIVE DIAGNOSIS:  Gross hematuria.  POSTOPERATIVE DIAGNOSIS:  Gross hematuria.  PROCEDURE:  Cystoscopy.  INDICATIONS:  The patient is a 50 year old female who has been complaining of gross painless hematuria.  A CT scan of the abdomen and pelvis showed no evidence of renal mass.  She is scheduled for cystoscopy.  OPERATIVE PROCEDURE:  Under general anesthesia the patient was prepped and draped and placed in the dorsal lithotomy position.  A 22 Wappler cystoscope was inserted in the bladder.  The bladder mucosa is normal.  There is no stone or tumor in the bladder.  The ureters orifices are in normal position and shape with clear efflux. There was no evidence of submucosal hemorrhage.  The bladder was then irrigated  with normal saline and bladder washings were sent for pathology.  The patient tolerated the procedure well and left the OR in satisfactory condition to post anesthesia care unit. DD:  10/02/99 TD:  10/02/99 Job: [id number]

## 2010-11-21 NOTE — Op Note (Signed)
[place]. [facility]  Patient:    Lindsey [name], Lindsey [name] Visit Number: [id number] MRN: [id number]          Service Type: SUR Location: 3100 3110 [id number] Attending Physician:  Coletta Memos Dictated by:   Mena Goes. Franky Macho, M.D. Proc. Date: 04/13/01 Admit Date:  04/13/2001                             Operative Report  PREOPERATIVE DIAGNOSIS:  Chiari 1 malformation.  POSTOPERATIVE DIAGNOSIS:  Chiari 1 malformation.  OPERATION PERFORMED:  Posterior fossa Chiari compression with posterior C1 laminectomy, dura patch grafting, no complications.  SURGEON:  Kyle L. Franky Macho, M.D.  ANESTHESIA:  General endotracheal.  ASSISTANT:  Payton Doughty, M.D.  INDICATIONS FOR PROCEDURE:  The patient is a 50 year old woman with headaches, dizziness, numbness in the upper extremities and neck pain.  She has a Chiari malformation.  She has had no relief from these symptoms with conservative methods and has agreed to undergo Chiari decompression.  DESCRIPTION OF PROCEDURE:  Lindsey [name] was brought to the operating room intubated and placed under general anesthesia.  She had a Foley catheter placed under sterile conditions.  She had an arterial line placed under sterile conditions.  She was placed in three-pin Mayfield fixation after adequate anesthesia was obtained.  This was done in approximately 60 pounds of pressure.  She was then rolled prone onto shoulder rolls ensuring that there was no pressure on any pressure points.  She had her neck flexed slightly. She was then attached to the Mayfield frame.  I shaved and prepped her cervical region posteriorly and she was draped in sterile fashion.  I infiltrated 10 cc of 0.5% lidocaine with 1:200,000 strength epinephrine into my proposed incision.  Took a #10 blade and carried this down through the subcutaneous tissue into the cervical fascia.  I then found the midline and using monopolar cautery went down the midline in between the  cervical paraspinous musculature.  I identified the occiput foramen magnum and the arch of C1.  I took the dissection down to the arch of C2.  I dissected laterally on C1 also in addition to the occiput.  After exposing enough bone, I then made four bur holes using the drill and an acorn bur.  I also shaved down the posterior fossa where I was going to remove bone with the acorn.  I used a Leksell rongeur to remove the superficial portion of the posterior arch of C1, then used a Kerrison punch to remove the remaining portion.  I also used the Kerrison punch to connect the bur holes and remove the bone in the occipital region.  After removing enough bone that I felt the decompression was adequate and after seeing the tonsils, I then opened the dura in a three cut fashion, two cuts going up to the corners of the cranial decompression and the last cut going down the midline into the cervical region.  After opening the dura and tacking it back with dural tack ups I was able to observe the cerebellar tonsils.  The arachnoid was opened and CSF did emanate from the incision.  I then placed my dural patch graft and sewed that with two running 3-0 Nurolon sutures with the assistance of Dr. Trey Sailors.  After obtaining a Valsalva maneuver, one side was finished filling, I did not observe CSF from the patch graft.  I then closed the wound after irrigating  it.  Dr. Phylliss Bob assisted with the closure.  We reapproximated the muscles and cervical fascia.  Then the subcutaneous tissues with Vicryl sutures.  I reapproximated the skin with interrupted vertical mattress sutures.  Sterile dressing was applied.  The patient was awakened moving all extremities postoperatively. Dictated by:   Mena Goes. Franky Macho, M.D. Attending Physician:  Coletta Memos DD:  04/13/01 TD:  04/13/01 Job: [id number]

## 2010-11-21 NOTE — H&P (Signed)
[place]. [facility]  Patient:    Lindsey [name], Lindsey [name] Visit Number: [id number] MRN: [id number]          Service Type: SUR Location: 3100 3110 01 Attending Physician:  Coletta Memos Dictated by:   Mena Goes. Franky Macho, M.D. Admit Date:  04/13/2001                           History and Physical  ADMITTING DIAGNOSIS:  Chiari I malformation.  HISTORY OF PRESENT ILLNESS:  Lindsey [name] presented to my office September 07, 2000, to evaluate some pain in the neck which went to her lower back and numbness in her hands.  She has numbness sometimes of the left side of the body.  She describes dizziness.  She also has some joint pain.  When she lies flat, she feels as if she cannot breathe.  The numbness in the hands is a daily event.  She has a stiff neck.  She has a history of migraine headaches. She is employed as a Education administrator and reports that she "passes out" at least once a month.  Lindsey [name] has undergone multiple radiographic studies.  She does have a Chiari malformation, which was seen on an MRI of the cervical spine.  She had a cervical spine MRI, which was normal.  Lindsey [name] after months of conservative care felt that since she still had the headaches and since the Chiari certainly could explain many of her symptoms, that she wanted to go ahead and undergo a Chiari decompression.  The risks of the procedure, including bleeding, infection, no relief, brain damage, stroke, coma, and death, were explained.  She understood and wished to proceed.  PAST MEDICAL HISTORY:  Significant for hypertension, irritable bowel syndrome, stomach pain.  She has undergone three tubal ligations, three cesarean sections, cystoscopy, breast reduction, cardiac catheterization, and breast biopsies.  ALLERGIES:  IV CONTRAST.  SOCIAL HISTORY:  She does not smoke, drink, or use illicit drugs.  MEDICATIONS:  Zanaflex, Claritin D, Vicodin, ibuprofen, Combivent, Ambien,  and Maxzide.  REVIEW OF SYSTEMS:  Positive for ear pain, tinnitus, sinus problems, chest pain, hypertension, leg pain with walking, asthma, shortness of breath, bronchitis, abdominal pain, arm weakness, leg weakness, back pain, arm pain, leg pain, joint pain, arthritis, neck pain, blurred vision.  She denies allergic, hematologic, endocrine, psychiatric, and skin problems.  PHYSICAL EXAMINATION:  VITAL SIGNS:  GENERAL:  She is alert, oriented x 4, and answers all questions appropriately.  NEUROLOGIC:  She has a negative Romberg test and can stand and walk without difficulty.  She can toe-walk, heel-walk, and do deep knee bends.  Negative straight leg raising bilaterally.  Full strength in the upper and lower extremities.  2+ reflexes at the biceps, triceps, brachioradialis, knees, and ankles.  Toes are downgoing with plantar stimulation.  She has no clonus, no Hoffman sign.  Pinprick, proprioception intact.  Pupils were equal, round, and reactive to light.  She has full extraocular movements.  Tongue and uvula are in the midline.  Shoulder shrug normal.  Symmetric facial sensation and movement.  Hearing intact to voice bilaterally.  NECK:  No cervical masses or bruits.  CHEST:  Lung fields clear.  CARDIAC:  Regular rhythm and rate, no murmurs or rubs.  PLAN:  The patient is admitted for a Chiari decompression. Dictated by:   Mena Goes. Franky Macho, M.D. Attending Physician:  Coletta Memos DD:  04/13/01 TD:  04/13/01 Job: [id number]

## 2010-11-21 NOTE — Discharge Summary (Signed)
[place]. [facility]  Patient:    Lindsey [name], Lindsey [name] Visit Number: [id number] MRN: [id number]          Service Type: SUR Location: 3000 3039 01 Attending Physician:  Coletta Memos Dictated by:   Mena Goes. Franky Macho, M.D. Admit Date:  04/24/2001 Discharge Date: 04/26/2001                             Discharge Summary  ADMITTING DIAGNOSES: 1. Nausea. 2. Vomiting. 3. Dehydration.  DISCHARGE DIAGNOSES: 1. Nausea. 2. Vomiting. 3. Dehydration.  PROCEDURES:  None.  LABORATORY DATA:  A head CT was performed and showed a normal brain, no evidence of hydrocephalus, and no hematomas.  HISTORY OF PRESENT ILLNESS:  Ms. Wysocki was operated on by myself to undergo QRA decompression.  She was discharged home last week and had called the office stating that she was nauseated.  Antiemetics were called in, but this did not seem to improve the situation.  HOSPITAL COURSE:  She eventually was brought into the hospital by Dr. Lovell Sheehan on April 24, 2001, secondary to nausea and vomiting.  She has had no nausea nor vomiting since she has been in the hospital.  She has had a head CT which was normal.  Neurologic examination has always been normal.  I will send her home on a short Decadron taper, 4 mg twice a day x 4 days and 4 mg once a day x 4 days.  I will also see her back in the office in approximately ten days.  I took out her sutures and she had one area where she had developed a stitch abscess and a small opening inferiorly.  I packed that and cleaned that last night.  The patient will be instructed on how to pack that wound and I will see her back in ten days. Dictated by:   Mena Goes. Franky Macho, M.D. Attending Physician:  Coletta Memos DD:  04/26/01 TD:  04/28/01 Job: [id number]

## 2010-11-21 NOTE — Op Note (Signed)
[place]. [facility]  Patient:    KIDA, DIGIULIO                       MRN: [id number] Proc. Date: 01/15/00 Adm. Date:  [id number] Disc. Date: [id number] Attending:  Annamarie Dawley CC:         Lorelle Formosa, M.D.                           Operative Report  REFERRING PHYSICIAN:  Lorelle Formosa, M.D.  PREOPERATIVE DIAGNOSIS:  Persistent nausea and vomiting.  POSTOPERATIVE DIAGNOSIS:  Grossly normal endoscopic examination.  PROCEDURE:  Esophagogastroduodenoscopy.  MEDICATIONS:  Demerol 75 mg IV, Versed 7.5 mg IV over 10-minute period of time.  INSTRUMENT:  Olympus video panendoscope.  ENDOSCOPIST:  Sharyn Dross., M.D.  INDICATIONS:  This pleasant 50 year old female was referred for an evaluation because of persistent nausea and vomiting.  Her primary physician states that the patient has been having these discomforts that is ongoing at this time.  The patient is unable to tolerate any type of food products at this point.  The symptoms are intermittent in character and is usually associated with some abdominal bloating and discomfort that is present.  There is no history of any hematemesis, melena, hematochezia that is noted.  She was referred because of the concern of the persistency of the discomfort and to determine etiology of her problems.  OBJECTIVE:  She is a pleasant female who appears to be in no acute distress. VITAL SIGNS: Stable.  HEENT: Anicteric.  NECK:  Supple.  LUNGS: Clear.  HEART: Regular rate and rhythm without heaves, thrills, murmurs, or gallops.  ABDOMEN: Soft with no tenderness and no hepatosplenomegaly.  EXTREMITIES: Within normal limits.  PLAN:  I am going to proceed with the endoscopic examination.  INFORMED CONSENT:  The patient was advised of the procedure, indications, and risks involved.  The patient has agreed to have the procedure performed at this time.  PREOPERATIVE PREPARATION:  The patient is  brought into the endoscopy unit where an IV for IV sedating medication was started.  A monitor was placed on the patient to monitor the patients vital signs and oxygen saturation.  Nasal oxygen at 2 liters per minute was used and after adequate sedation was performed, the procedure was begun.  DESCRIPTION OF PROCEDURE:  The instrument was advanced with the patient lying in the left lateral position via direct technique without difficulty.  The oropharynx, epiglottis, vocal cords, and piriform sinuses appeared to be grossly within normal limits.  The esophagus was normal without any evidence of acute inflammation, ulcerations, hiatal hernias, or varicies appreciated.  The gastric area showed a normal mucous lake without any evidence of acute inflammation or ulcerations noted.  The antral area appeared to be within normal limits and the pyloris was normal with good parastaltic activity.  Upon advancement through the pyloric canal, the duodenal bulb and second portion appeared to be within normal limits.  The instrument was retracted back where a retroflexed view of the cardia revealed no gross pathology.  The Z-line appeared to be approximately 38 cm distal esophagus that was noted.  A retroflexed view of the cardia revealed no gross pathology that was appreciated at this time such as hiatal hernia that was noted.  The instrument was subsequently removed per Betti without difficulty. The patient tolerated the procedure well.  TREATMENT: 1. Conservative management at this  time. 2. Will start the patient with Reglan 10 mg q.12h. 3. Will have the patient follow up with me in the office in the next few weeks. DD:  01/15/00 TD:  01/15/00 Job: [id number]

## 2010-11-21 NOTE — Discharge Summary (Signed)
[place]. [facility]  Patient:    Lindsey [name], Lindsey [name]                       MRN: [id number] Adm. Date:  [id number] Disc. Date: [id number] Attending:  Benny Lennert                           Discharge Summary  PRINCIPAL DIAGNOSES: 1. Chest pain, musculoskeletal. 2. Hiatal hernia with gastroesophageal reflux disease. 3. Hypertension. 4. Obesity. 5. Fibromyalgia.  DISCHARGE MEDICATIONS: 1. Protonix 40 mg one daily. 2. Darvocet-N 100 one four times daily.  DISCHARGE ACTIVITY:  As tolerated.  DISCHARGE DIET:  Low fat, low salt diet.  FOLLOW-UP:  Ricki Rodriguez, M.D., in two to four weeks.  CONDITION ON DISCHARGE:  Stable.  PRINCIPAL PROCEDURE:  Adenosine Cardiolite stress test and upper GI series plus barium swallow.  HISTORY OF PRESENT ILLNESS:  This is a 50 year old black female who had substernal chest pain off and on for the last two weeks. The patient claims the chest pain radiates to her left arm. She had a history of fibromyalgia and multiple drug use in the past. She has cardiac risk factors of hypertension and family history of premature coronary artery disease to the mother.  PHYSICAL EXAMINATION:  GENERAL APPEARANCE:  The patient is alert and oriented x 3.  VITAL SIGNS:  Temperature 97.7, pulse 80, respiratory rate 16, blood pressure 110/83.  Height 5 feet 1 inch, weight 178 pounds.  HEENT:  Head atraumatic and normocephalic. Eyes brown with pupils are equal, round and reactive to light, extraocular movements intact, conjunctiva pink, sclera nonicteric.  Ears, nose and throat were grossly unremarkable.  NECK:  No JVD, no carotid bruit.  LUNGS:  Clear bilaterally with a marked chest wall tenderness.  CARDIOVASCULAR:  Normal S1 and S2.  ABDOMEN:  Soft and nontender.  EXTREMITIES:  Negative for cyanosis or clubbing.  CNS:  Cranial nerves grossly intact.  The patient moves all four extremities.  LABORATORY DATA:  Normal hemoglobin  and hematocrit, wbc count, platelet count. Normal electrolytes.  Normal BUN, creatinine and glucose.  Normal CK-MB and troponin I.  PT was 12.5.  EKG was normal sinus rhythm.  Adenosine Cardiolite stress test was negative for an ischemia and upper GI series with barium swallow revealed a hiatal hernia with reflux disease.  HOSPITAL COURSE:  The patient was admitted to telemetry unit.  Myocardial infarction was ruled out. She underwent adenosine Cardiolite stress test at [facility] due to technical difficulty at [facility].  She did not have any ischemia and she had chest pain before, during and after the stress test.  Then the patient underwent barium swallow with upper GI series which showed a 5 cm sliding hiatal hernia with significant GE reflux.  The patient was started on proton pump inhibitor, Protonix 40 mg q. daily.  She was given analgesic like Darvocet-N 100 of one p.o. q.i.d. p.r.n. for chest pain and discharged home in satisfactory condition with follow-up by me in two weeks. DD:  10/27/00 TD:  10/29/00 Job: [id number]

## 2010-11-21 NOTE — Procedures (Signed)
[place]. [facility]  Patient:    CODIE, HAINER                       MRN: [id number] Adm. Date:  [id number] Attending:  Sharyn Dross                           Procedure Report  PREOPERATIVE DIAGNOSIS:  Diarrhea, presumed infectious.  POSTOPERATIVE DIAGNOSIS:  Normal colonoscopic examination to the cecum.  PROCEDURE:  Colonoscopy.  MEDICATIONS:  Demerol 75 mg IV and Versed 7.5 mg IV over a 10-minute period of time.  INSTRUMENT:  Olympus video pan-colonoscope.  ENDOSCOPIST:  Sharyn Dross., M.D.  INDICATIONS:  This patient presently came in with history of abdominal pains as well as nausea and vomiting and intermittent diarrhea that were present. The symptoms have progressively worsened at this time.  She recently underwent endoscopy approximately 48 hours ago and subsequently was brought in for further colonoscopic examination at this time.  There has been no change in her stool character or color since her last evaluation that is present at this time.  OBJECTIVE FINDINGS:  Her vital signs are stable.  HEENT:  Anicteric.  Neck was supple.  Lungs are clear.  Heart had a regular rate and rhythm, without heaves, thrills, murmurs or gallops.  The abdomen was soft.  No tenderness. No hepatosplenomegaly.  The extremities are unremarkable.  PLAN:  I am presently going to proceed with a colonoscopic examination.  INFORMED CONSENT:  The patient was advised of the procedure, indication and risks involved.  She has agreed to have the procedure performed.  PREOPERATIVE PREPARATION:  Patient was brought into the endoscopy unit where an IV for IV sedating medication was started.  A monitor was placed on the patient to monitor the patients vital signs and oxygen saturation.  Nasal oxygen at 2 l/min was used and after adequate sedation was performed, the procedure was begun.  DESCRIPTION OF PROCEDURE:  The instrument was advanced with the patient  lying the left lateral position approximately 90 cm from the proximal colon to the cecal region.  This was confirmed by visualization of the ileocecal valve region as well as the appendiceal orifice that was noted.  There appeared to be no gross abnormalities such as masses, polyps or stricture lesions appreciated.  The vascular pattern appeared to be well within normal limits throughout the entire colon.  The mucosal pattern showed some increased hyperemia in the proximal portion of the ascending region; photographs were taken of this region at this time.  Otherwise, there were no other abnormalities such as diverticular disease or granular disease that are noted at this time.  There is no evidence of any internal or external hemorrhoids upon exiting from the area.  Patient tolerated the procedure well.  TREATMENT: 1. Conservative management at this time. 2. Levbid b.i.d. 3. Will have followup in the office in the next few weeks. DD:  01/22/00 TD:  01/23/00 Job: [id number]

## 2010-11-21 NOTE — H&P (Signed)
[place]. [facility]  Patient:    Lindsey [name], Lindsey [name]. Visit Number: [id number] MRN: [id number]          Service Type: Attending:  Cristi Loron, M.D. Dictated by:   Cristi Loron, M.D. Adm. Date:  04/24/01                           History and Physical  CHIEF COMPLAINT:  Nausea and vomiting.  HISTORY OF PRESENT ILLNESS:  The patient is a 50 year old black female who is a patient of Kyle L. Franky Macho, M.D.  He performed a posterior fossa decompression to treat a Chiari I malformation on April 13, 2001.  She had not a particularly eventful postoperative course and was discharged to home on April 20, 2001.  She tells me that since she has been home she has had persistent nausea and vomiting to the point where she cannot hold down any medicine, food, etc.  She presents to the emergency department today with these complaints.  She denies any significant headaches.  She has had no seizures, fevers, or chills.  Her neck is of course sore.  PAST MEDICAL HISTORY:  Positive for hypertension, gastroesophageal reflux disease, irritable bowel syndrome, Chiari I malformation, and angina.  PAST SURGICAL HISTORY:  Status post Chiari I decompression on April 13, 2001, as above.  Breast reduction.  Three cesarean sections.  Tubal ligation. Cardiac catheterization, which was negative by report.  MEDICATIONS PRIOR TO ADMISSION:  Unfortunately she does not know the names of her medicines, but she knows that she is taking a blood pressure pill for her reflux.  I have asked her to obtain her medications from her family and let us know what they are.  ALLERGIES:  She has no known drug allergies.  SOCIAL HISTORY:  The patient is married.  She denies tobacco, ethanol, or drug use.  PHYSICAL EXAMINATION:  GENERAL APPEARANCE:  A pleasant, obese, 50 year old, black female in no apparent distress.  VITAL SIGNS:  Temperature 98.6 degrees Fahrenheit orally, heart  rate 100, respiratory rate 18, blood pressure 121/89, oxygen saturation 99% on room air.  HEENT:  Normocephalic.  Her posterior incision is healing well.  There are no signs of infection, discharge, CSF, fistula, etc.  Pupils equal, round, and reactive to light.  Oropharynx somewhat dry.  Otherwise unremarkable.  NECK:  Supple.  She has good range of motion.  She has some tenderness to palpation.  There are no major deformities or tracheal deviation.  THORAX:  Symmetric.  HEART:  Regular rate and rhythm.  ABDOMEN:  Obese.  EXTREMITIES:  Without deformities.  NEUROLOGIC:  The patient is alert and oriented x 3.  Cranial nerves II-XII are grossly intact bilaterally.  Motor strength is grossly normal in all four extremities.  ASSESSMENT AND PLAN:  Nausea, vomiting, and dehydration.  I will admit the patient for IV hydration.  I am going to work up with a cranial CT scan to rule out hydrocephalus as the cause of this, but I doubt it.  I think it is secondary indirectly to her surgery. Dictated by:   Cristi Loron, M.D. Attending:  Cristi Loron, M.D. DD:  04/24/01 TD:  04/25/01 Job: [id number]

## 2010-11-21 NOTE — Discharge Summary (Signed)
[place]. [facility]  Patient:    Lindsey [name], Lindsey [name] Visit Number: [id number] MRN: [id number]          Service Type: SUR Location: 3000 3039 01 Attending Physician:  Coletta Memos Dictated by:   Mena Goes. Franky Macho, M.D. Admit Date:  04/24/2001 Discharge Date: 04/26/2001                             Discharge Summary  DATE OF BIRTH:  [birth year]  ADMITTING DIAGNOSIS:  Chiari I malformation.  DISCHARGE DIAGNOSIS:  Chiari I malformation.  PROCEDURE:  Suboccipital craniectomy and C1 laminectomy with dural patch grafting.  CONDITION ON DISCHARGE:  Alive and well.  HISTORY OF PRESENT ILLNESS:  Ms. Talamante is a 50 year old woman who presented with complaints of headaches, numbness in the left side of her body, tingling in the hands.  She had a Chiari malformation on MRI, although not severe, certainly was present.  After her symptoms persisted, she agreed to undergo a Chiari decompression.  HOSPITAL COURSE:  She was admitted on April 13, 2001, and had an uncomplicated procedure.  Postoperatively, she has done extremely well.  Her wound is clean and dry without signs of infection.  Neurologic examination is normal.  Pupils are equal, round and reactive to light with full extraocular movements.  Tongue and uvula in midline.  Shoulder shrug is normal.  Hearing intact to voice and no nystagmus.  She will be discharged home, tolerating a regular diet, ambulating without assistance and controlling her pain with oral medications.  ACTIVITY:  Do not drive.  FOLLOWUP:  Dr. Franky Macho to see patient back in the office in two days for suture removal. Dictated by:   Mena Goes. Franky Macho, M.D. Attending Physician:  Coletta Memos DD:  04/13/01 TD:  04/20/01 Job: [id number]

## 2010-11-21 NOTE — H&P (Signed)
[place]. [facility]  Patient:    Lindsey [name], Lindsey [name] Visit Number: [id number] MRN: [id number]          Service Type: DSU Location: 3000 3001 01 Attending Physician:  Coletta Memos Dictated by:   Mena Goes. Franky Macho, M.D. Admit Date:  05/27/2001                           History and Physical  CONTINUATION  PAST MEDICAL HISTORY:  1. Chiari malformation.  2. Breast reduction done in November 1998.  3. Undergone biopsy of the breast.  4. Cesarean section x 3.  5. Bilateral tubal ligation.  6. Chest wall pain and underwent a cardiac catheterization.  7. History of shortness of breath and has a diagnosis of asthma.  8. History of migraines.  9. Hiatal hernia and reflux. 10. Sickle cell trait. 11. She does not have any problems with her vision.  MEDICATIONS:  1. She does take Ambien at times for sleeping problems.  2. Nexium 40 mg q.a.m.  3. Meclizine 12.5 mg t.i.d.  4. Vicoprofen b.i.d.  5. Ibuprofen 800 mg t.i.d.  6. Zanaflex 4 mg q.d.  7. Claritin.  8. Maxzide 37.5/25 q.d.  9. Alprazolam 0.25 b.i.d. 10. Combivent 2 puffs p.r.n. 11. Ambien 10 mg q.h.s.  SOCIAL HISTORY:  She does not use alcohol or smoke tobacco.  She does not use any illicit drugs.  She is married and has children.  She works as a Curator.  PHYSICAL EXAMINATION:  NEUROLOGIC:  Alert and oriented x 4.  Answers all questions appropriately. Memory ______ fund of knowledge normal.  She is well-kempt and in mild distress.  She is febrile this morning and appears ill.  She has now an open area inferiorly at the bottom of the incision which is purulent in appearance. It is tender and swollen.  She has full strength in the upper and lower extremities.  Pupils equal, round, and reactive to light.  Full extraocular movements.  Tongue and uvula midline.  Shoulder shrug normal.  Facial sensation normal.  Facial movements are normal.  She has no cervical masses or bruits.  LUNGS:  Lung  fields clear.  HEART:  Regular rhythm and rhythm.  No murmurs or rubs.  PLAN:  The patient will be admitted for wound exploration secondary to probable wound infection.  The risks of the procedure, inability to rid Ms. Hagner of the infection.  I have explained the procedure to her, and she will be placed under general anesthetic for her comfort ______ is that I will have to place her prone.  Dictated by:   Mena Goes. Franky Macho, M.D. Attending Physician:  Coletta Memos DD:  05/27/01 TD:  05/28/01 Job: [id number]

## 2010-11-21 NOTE — Op Note (Signed)
[place]. [facility]  Patient:    JANEENE, SAND Visit Number: [id number] MRN: [id number]          Service Type: DSU Location: 3000 3001 01 Attending Physician:  Coletta Memos Dictated by:   Mena Goes. Franky Macho, M.D. Proc. Date: 05/27/01 Admit Date:  05/27/2001                             Operative Report  PREOPERATIVE DIAGNOSIS: Wound infection.  POSTOPERATIVE DIAGNOSIS: Suboccipital decompression wound infection.  SURGEON: Kyle L. Franky Macho, M.D.  ASSISTANT: None.  COMPLICATIONS: None.  INDICATIONS: The patient is a 50 year old woman with evidence of purulent drainage from the inferior portion of her suboccipital craniectomy incision. I have recommended and she has agreed to undergo wound exploration for debridement.  DESCRIPTION OF PROCEDURE: Ms. Mccaster was intubated and placed under general anesthesia, then turned prone.  Her head was placed on a horseshoe head rest making sure there was no pressure on the orbit.  Her neck was then prepped with Betadine and she was draped in sterile fashion.  I opened the incision with a #10 blade and took this down through the superficial tissue.  I immediately encountered large purulent discharge.  Cultures were sent to the laboratory.  I then opened the remainder of the wound and got down to the bone.  The bone did not appear to be infected but I did remove some.  I debrided the subcutaneous tissue until there was hardy bleeding tissue remaining.  I pulse irrigated 2 liters of vancomycin solution 500 mg in 1 liter concentration.  After the pulse irrigation I irrigated again with another lither of normal saline.  I then loosely reapproximated the fascial layer with two Vicryl sutures.  I then reapproximated the skin with 3-0 stainless steel wire.  A sterile dressing was applied.  The patient was then rolled supine, extubated, and was doing well. Dictated by:   Mena Goes. Franky Macho, M.D. Attending Physician:   Coletta Memos DD:  05/27/01 TD:  05/29/01 Job: [id number]

## 2010-11-21 NOTE — Cardiovascular Report (Signed)
Lindsey [name], Lindsey [name]                ACCOUNT NO.:  [id number]   MEDICAL RECORD NO.:  [id number]          PATIENT TYPE:  INP   LOCATION:  6527                         FACILITY:  [facility]   PHYSICIAN:  Ricki Rodriguez, M.D.  DATE OF BIRTH:  [birth year]   DATE OF PROCEDURE:  07/23/2005  DATE OF DISCHARGE:                              CARDIAC CATHETERIZATION   PROCEDURE:  1.  Left heart catheterization.  2.  Selective coronary angiography.  3.  Left ventricular function study.   INDICATION:  This 50 year old black female had typical chest pain along with  abnormal stress test with a family history of coronary artery disease at a  young age and hypertension.   APPROACH:  Right femoral artery using 5-French sheath and catheters.  A  Smart needle was used to vascular access.   COMPLICATIONS:  None.   HEMODYNAMIC DATA:  The left ventricular pressure was 178/15 and aortic  pressure was 173/100.  The patient received 2.5 mg of IV Vasotec and 5 mg of  IV Lopressor for blood pressure control.   CORONARY ANATOMY:  The left main coronary artery was unremarkable.   Left anterior descending coronary artery:  The left anterior descending  coronary artery was also unremarkable; it was long enough and wrapped around  the apex of the heart, supplying half of the posterior septum.  Its diagonal  1, 2 and 3 vessels were unremarkable.   Left circumflex coronary artery:  The left circumflex coronary artery was  essentially unremarkable.  Its obtuse marginal branch 1 was a large vessel.  Obtuse marginal branch 2 and 3 were relatively smaller vessels.   Right coronary artery:  The right coronary artery was dominant and  unremarkable.  Its posterior descending coronary artery and posterolateral  branches were relatively small vessels and unremarkable.   LEFT VENTRICULOGRAM:  The left ventriculogram showed hyperdynamic wall  motion; some of it was from catheter-induced ventricular tachycardia and  ejection fraction was 70%.   IMPRESSION:  1.  Normal coronaries.  2.  Hypertensive heart disease.   RECOMMENDATION:  This patient will have noncardiac chest pain evaluation,  lifestyle modification and more aggressive antihypertensive medication  management.      Ricki Rodriguez, M.D.  Electronically Signed     ASK/MEDQ  D:  07/23/2005  T:  07/24/2005  Job:  [id number]   cc:   [facility]   Jonna L. Robb Matar, M.D.

## 2010-11-21 NOTE — H&P (Signed)
[place]. [facility]  Patient:    Lindsey [name], Lindsey [name] Visit Number: [id number] MRN: [id number]          Service Type: DSU Location: 3000 3001 01 Attending Physician:  Coletta Memos Dictated by:   Mena Goes. Franky Macho, M.D. Admit Date:  05/27/2001                           History and Physical  INCOMPLETE  CHIEF COMPLAINT:  Wound infection.  HISTORY OF PRESENT ILLNESS:  Lindsey [name] is a 50 year old woman whom I took to the operating room on April 13, 2001 for a QRED compression. Postoperatively she did well initially, but came back into the hospital because she was having some nausea and vomiting.  She was then readmitted and on discharge I noticed that she had a portion of the wound breakdown inferiorly.  I had her pack that and she came back to see me in the office approximately a week later.  At that time the wound looked quite good and there was a clean base of granulation tissue where she had been packing the incision.  I told her at that time that she only needed to cover it and she left.  She was afebrile and there was no tenderness or erythema or discharge from the incision.  Neurologically she never had any problems.  She has having headaches preoperatively and those were gone after the decompression.  She also had a kind of pulsation which was unpleasant in her head but that also resolved.  While on vacation last week, she came in to see Dr. Lovell Sheehan - my partner - because she was having some problems and drainage from the wound, now at the superior portion.  Dr. Lovell Sheehan saw her, opened that small portion up, and had her pack the incision there.  He also gave her a weeks course of Keflex.  He had her come back in two days for follow-up and the wound was improved at that time.  There was no mention of any problem with the wound inferiorly.  She came back to see me this past Tuesday on November 19 and at that time she had a very hard, tender area  inferiorly but I was not able to express any pus or other fluid from there.  She also had what appeared to be a closed area superiorly but it was crusted over and the dressing that she had had on did show some evidence of drainage.  She also reported that she had been running a low-grade fever for quite some time and she had gotten up to at least 102.  I told her that was not really low-grade and that we should probably go ahead and explore the wound.  She called me up on Thursday stating that there was a large amount of discharge from the wound.  At that point in time I thought that the best thing to do would just be to go ahead and explore the wound.  She is therefore admitted today for wound exploration and probable wound infection. Dictated by:   Mena Goes. Franky Macho, M.D. Attending Physician:  Coletta Memos DD:  05/27/01 TD:  05/28/01 Job: [id number]

## 2010-12-08 ENCOUNTER — Other Ambulatory Visit: Payer: Self-pay | Admitting: Family Medicine

## 2010-12-08 DIAGNOSIS — R928 Other abnormal and inconclusive findings on diagnostic imaging of breast: Secondary | ICD-10-CM

## 2010-12-09 ENCOUNTER — Other Ambulatory Visit: Payer: Self-pay | Admitting: Family Medicine

## 2010-12-09 ENCOUNTER — Other Ambulatory Visit: Payer: Self-pay | Admitting: Internal Medicine

## 2010-12-16 ENCOUNTER — Ambulatory Visit
Admission: RE | Admit: 2010-12-16 | Discharge: 2010-12-16 | Disposition: A | Discharge status: Home / Self Care | Payer: Medicaid Other | Source: Ambulatory Visit | Attending: *Deleted | Admitting: *Deleted

## 2010-12-16 ENCOUNTER — Other Ambulatory Visit: Payer: Self-pay | Admitting: Family Medicine

## 2010-12-16 DIAGNOSIS — R928 Other abnormal and inconclusive findings on diagnostic imaging of breast: Secondary | ICD-10-CM

## 2010-12-23 ENCOUNTER — Inpatient Hospital Stay (HOSPITAL_COMMUNITY)
Admission: EM | Admit: 2010-12-23 | Discharge: 2010-12-25 | DRG: 065 | Disposition: A | Discharge status: Home Health | Payer: Medicaid Other | Source: Ambulatory Visit | Attending: Family Medicine | Admitting: Family Medicine

## 2010-12-23 DIAGNOSIS — N289 Disorder of kidney and ureter, unspecified: Secondary | ICD-10-CM

## 2010-12-23 DIAGNOSIS — I10 Essential (primary) hypertension: Secondary | ICD-10-CM

## 2010-12-23 DIAGNOSIS — I6350 Cerebral infarction due to unspecified occlusion or stenosis of unspecified cerebral artery: Principal | ICD-10-CM | POA: Diagnosis present

## 2010-12-23 DIAGNOSIS — R110 Nausea: Secondary | ICD-10-CM | POA: Diagnosis present

## 2010-12-23 DIAGNOSIS — R05 Cough: Secondary | ICD-10-CM | POA: Diagnosis present

## 2010-12-23 DIAGNOSIS — I129 Hypertensive chronic kidney disease with stage 1 through stage 4 chronic kidney disease, or unspecified chronic kidney disease: Secondary | ICD-10-CM | POA: Diagnosis present

## 2010-12-23 DIAGNOSIS — Z91041 Radiographic dye allergy status: Secondary | ICD-10-CM

## 2010-12-23 DIAGNOSIS — G4733 Obstructive sleep apnea (adult) (pediatric): Secondary | ICD-10-CM

## 2010-12-23 DIAGNOSIS — E669 Obesity, unspecified: Secondary | ICD-10-CM | POA: Diagnosis present

## 2010-12-23 DIAGNOSIS — G4730 Sleep apnea, unspecified: Secondary | ICD-10-CM | POA: Diagnosis present

## 2010-12-23 DIAGNOSIS — N179 Acute kidney failure, unspecified: Secondary | ICD-10-CM | POA: Diagnosis present

## 2010-12-23 DIAGNOSIS — G589 Mononeuropathy, unspecified: Secondary | ICD-10-CM | POA: Diagnosis present

## 2010-12-23 DIAGNOSIS — N189 Chronic kidney disease, unspecified: Secondary | ICD-10-CM | POA: Diagnosis present

## 2010-12-23 DIAGNOSIS — Z8673 Personal history of transient ischemic attack (TIA), and cerebral infarction without residual deficits: Secondary | ICD-10-CM

## 2010-12-23 DIAGNOSIS — E785 Hyperlipidemia, unspecified: Secondary | ICD-10-CM | POA: Diagnosis present

## 2010-12-23 DIAGNOSIS — R209 Unspecified disturbances of skin sensation: Secondary | ICD-10-CM | POA: Diagnosis present

## 2010-12-23 DIAGNOSIS — G43909 Migraine, unspecified, not intractable, without status migrainosus: Secondary | ICD-10-CM | POA: Diagnosis present

## 2010-12-23 DIAGNOSIS — R059 Cough, unspecified: Secondary | ICD-10-CM | POA: Diagnosis present

## 2010-12-23 LAB — CBC
MCH: 26.2 pg (ref 26.0–34.0)
MCV: 75.2 fL — ABNORMAL LOW (ref 78.0–100.0)
Platelets: 305 10*3/uL (ref 150–400)
RBC: 4.23 MIL/uL (ref 3.87–5.11)
RDW: 16.5 % — ABNORMAL HIGH (ref 11.5–15.5)
WBC: 7.7 10*3/uL (ref 4.0–10.5)

## 2010-12-23 LAB — BASIC METABOLIC PANEL
BUN: 15 mg/dL (ref 6–23)
Calcium: 9.3 mg/dL (ref 8.4–10.5)
Creatinine, Ser: 1.52 mg/dL — ABNORMAL HIGH (ref 0.50–1.10)
GFR calc Af Amer: 44 mL/min — ABNORMAL LOW (ref >60)
GFR calc non Af Amer: 37 mL/min — ABNORMAL LOW (ref >60)
Glucose, Bld: 99 mg/dL (ref 70–99)

## 2010-12-23 LAB — DIFFERENTIAL
Basophils Relative: 0 % (ref 0–1)
Eosinophils Absolute: 0.5 10*3/uL (ref 0.0–0.7)
Eosinophils Relative: 7 % — ABNORMAL HIGH (ref 0–5)
Lymphs Abs: 2.9 10*3/uL (ref 0.7–4.0)
Monocytes Relative: 10 % (ref 3–12)
Neutrophils Relative %: 46 % (ref 43–77)

## 2010-12-23 LAB — SAMPLE TO BLOOD BANK

## 2010-12-24 ENCOUNTER — Inpatient Hospital Stay (HOSPITAL_COMMUNITY): Payer: Medicaid Other

## 2010-12-24 ENCOUNTER — Emergency Department (HOSPITAL_COMMUNITY): Payer: Medicaid Other

## 2010-12-24 ENCOUNTER — Encounter: Payer: Self-pay | Admitting: Family Medicine

## 2010-12-24 DIAGNOSIS — R209 Unspecified disturbances of skin sensation: Secondary | ICD-10-CM

## 2010-12-24 LAB — CBC
HCT: 30.6 % — ABNORMAL LOW (ref 36.0–46.0)
MCH: 26.1 pg (ref 26.0–34.0)
MCHC: 34.6 g/dL (ref 30.0–36.0)
MCV: 75.4 fL — ABNORMAL LOW (ref 78.0–100.0)
Platelets: 277 10*3/uL (ref 150–400)
RDW: 16.3 % — ABNORMAL HIGH (ref 11.5–15.5)

## 2010-12-24 LAB — COMPREHENSIVE METABOLIC PANEL
AST: 35 U/L (ref 0–37)
Albumin: 3.5 g/dL (ref 3.5–5.2)
Calcium: 9.2 mg/dL (ref 8.4–10.5)
Chloride: 102 mEq/L (ref 96–112)
Creatinine, Ser: 1.27 mg/dL — ABNORMAL HIGH (ref 0.50–1.10)
Total Bilirubin: 0.4 mg/dL (ref 0.3–1.2)

## 2010-12-24 LAB — PROTIME-INR: INR: 1.04 (ref 0.00–1.49)

## 2010-12-24 LAB — LIPID PANEL
HDL: 37 mg/dL — ABNORMAL LOW (ref >39)
LDL Cholesterol: 110 mg/dL — ABNORMAL HIGH (ref 0–99)
Total CHOL/HDL Ratio: 4.5 RATIO
Triglycerides: 94 mg/dL (ref <150)
VLDL: 19 mg/dL (ref 0–40)

## 2010-12-24 LAB — CARDIAC PANEL(CRET KIN+CKTOT+MB+TROPI): Troponin I: <0.3 ng/mL (ref <0.30)

## 2010-12-24 LAB — APTT: aPTT: 31 seconds (ref 24–37)

## 2010-12-24 NOTE — Progress Notes (Signed)
[facility] Admission History and Physical  Patient name: Lindsey [name] Medical record number: [id number] Date of birth: 1961/03/12 Age: 50 y.o. Gender: female  Primary Care Provider: Ellery Plunk, MD  Chief Complaint:arm and leg numbness History of Present Illness: Lindsey [name] is a 51 y.o. year old female presenting with numbness in left arm and leg x 2 days with some frontal headache.  THis headache is similar to her usual migraine.  She is also complaining of fever up to 102 and persistent cough productive of white sputum at home.  Pt states she has seen some blood in her sputum.  She complains also of some chronic nausea and has had poor oral intake due to this.  She thinks phenergan has helped her in the past.  While in ED, CT head did not show any changes.  She received an albuterol neb that helped with her cough.  Pt states she is taking all of her medications but cannot name them.  She states that she is taking ASA and plavix.    Patient Active Problem List  Diagnoses  . HYPERLIPIDEMIA  . OBESITY, MORBID  . ESSENTIAL HYPERTENSION, BENIGN  . CVA  . GASTROINTESTINAL DISORDER, FUNCTIONAL  . ARNOLD-CHIARI MALFORMATION  . VERTIGO  . SLEEP APNEA  . HEADACHE  . Other dyspnea and respiratory abnormality  . CHEST PAIN UNSPECIFIED  . EMESIS  . DIARRHEA  . CONTRAST DYE ALLERGY  . BACK PAIN, LUMBAR, WITH RADICULOPATHY  . Neuropathy  . Well woman exam   Past Medical History: Past Medical History  Diagnosis Date  . Hypertension   . Chronic kidney disease   . Stroke   . Neuropathy   . Sleep apnea   . Chiari I malformation   . Migraine   . Hyperlipidemia     Past Surgical History: Past Surgical History  Procedure Date  . Brain surgery   . Breast surgery   . Cesarean section     Social History: History   Social History  . Marital Status: Divorced    Spouse Name: N/A    Number of Children: N/A  . Years of Education: N/A   Social  History Main Topics  . Smoking status: Never Smoker   . Smokeless tobacco: None  . Alcohol Use: No  . Drug Use: No  . Sexually Active: Not Currently   Other Topics Concern  . None   Social History Narrative  . None    Family History: Family History  Problem Relation Age of Onset  . Hyperlipidemia Mother   . Heart disease Mother   . Hypertension Mother   . Heart disease Sister   . Heart disease Brother     Allergies: No Known Allergies  Current Outpatient Prescriptions  Medication Sig Dispense Refill  . amLODipine (NORVASC) 10 MG tablet Take 1 tablet (10 mg total) by mouth daily.  30 tablet  3  . gabapentin (NEURONTIN) 100 MG capsule Take 3 capsules (300 mg total) by mouth at bedtime.  30 capsule  2  . hydrALAZINE (APRESOLINE) 50 MG tablet Take 1 tablet (50 mg total) by mouth 3 (three) times daily.  90 tablet  3  . hydrochlorothiazide 25 MG tablet Take 1 tablet (25 mg total) by mouth daily.  30 tablet  3  . lisinopril (PRINIVIL,ZESTRIL) 20 MG tablet Take 1 tablet (20 mg total) by mouth daily.  30 tablet  3  . metoclopramide (REGLAN) 10 MG tablet Take 0.5 tablets (5 mg total)  by mouth 4 (four) times daily. take one 30 min before meals and at bedtime  120 tablet  3  . metoprolol (TOPROL-XL) 50 MG 24 hr tablet Take 1 tablet (50 mg total) by mouth daily.  30 tablet  3  . simvastatin (ZOCOR) 40 MG tablet Take 1 tablet (40 mg total) by mouth at bedtime.  30 tablet  3  . traMADol (ULTRAM) 50 MG tablet Take 1 tablet (50 mg total) by mouth every 6 (six) hours as needed for pain.  90 tablet  3   Review Of Systems: Per HPI with the following additions: nausea and poor oral intake Otherwise 12 point review of systems was performed and was unremarkable.  Physical Exam: Pulse: 90  Blood Pressure: 160/117 RR: 20   O2: 96 on RA Temp: 99.3  General: alert, cooperative, appears stated age and moderately obese HEENT: PERRLA, extra ocular movement intact, sclera clear, anicteric, oropharynx  clear, no lesions, neck supple with midline trachea and thyroid without masses Heart: S1, S2 normal, no murmur, rub or gallop, regular rate and rhythm Lungs: clear to auscultation, no wheezes or rales and unlabored breathing Abdomen: abdomen is soft without significant tenderness, masses, organomegaly or guarding Extremities: extremities normal, atraumatic, no cyanosis or edema Skin:no rashes, no jaundice Neurology: normal without focal findings, mental status, speech normal, alert and oriented x3, PERLA, cranial nerves 2-12 intact, muscle tone and strength normal and symmetric and sensation grossly normal  Labs and Imaging: Lab Results  Component Value Date/Time   NA 137 12/23/2010 10:19 PM   K 3.4* 12/23/2010 10:19 PM   CL 100 12/23/2010 10:19 PM   CO2 27 12/23/2010 10:19 PM   BUN 15 12/23/2010 10:19 PM   CREATININE 1.52* 12/23/2010 10:19 PM   GLUCOSE 99 12/23/2010 10:19 PM   Lab Results  Component Value Date   WBC 7.7 12/23/2010   HGB 11.1* 12/23/2010   HCT 31.8* 12/23/2010   MCV 75.2* 12/23/2010   PLT 305 12/23/2010   CT-scan of the brain--  1.  No acute finding.   2.  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   3.  Status post suboccipital decompression. CXR- no acute finding Last ECHO was 1/11 and was EF 60% with no wall motion or valve abnormalities  Assessment and Plan: Lindsey [name] is a 50 y.o. year old female presenting with symptoms concerning for stroke. 1. Extremity numbness/parathesias-  No CVA seen on CT.  Will obtain MRI, as well as get CVA testing including TSH, A1C, FLP.  Will not get ECHO since pt had one in 1/11 with no new cardiac findings.  May need to get neuro involved in the hospital.  Pt has poor record for follow up and has a long hx of imbalance, HA, nausea after her previous CVAs.   I restarted ASA and plavix because they were on last d/c, but they were not on home med list, leading me to think that she is likely not taking  them.  2.   HTN-  Pt has poor compliance with medications, will restart home meds to slowly bring her BP down. Pt typically does well in hospital on these meds.  I held hydralazine since last office visit her BP was good without it.  We can titrate meds in hospital.   3.   HL- continue statin, check FLP 4.   HA-may be migraine or result of stroke.  Will avoid vasoconstrictive medications like triptans and serotonin agonists.  Will try percocet for pain relief overnight. 5.   Cough- CXR clear, will watch pt for fevers or elevation in WBC.  Monitor clinically.  Pt felt better with albuterol so will continue overnight. 6.   A on C KI-  Pt with baseline Cr of 1.2, now 1.5.  Will give some gentle hydration and monitor Cr.  Will replace K.  Pt may be volume depleted as has not been eating well due to nausea.  Will check U/A. 7.   Nausea-  Will give zofran and restart reglan per home meds.  This has helped in the past. She denies constipation. 2. FEN/GI: HH diet, IVF at 130ml/hr 3. Prophylaxis: lovenox, protonix 4. Disposition: Pending further imaging, clinical improvement.

## 2010-12-25 LAB — BASIC METABOLIC PANEL
CO2: 25 mEq/L (ref 19–32)
Calcium: 9.3 mg/dL (ref 8.4–10.5)
Chloride: 100 mEq/L (ref 96–112)
GFR calc Af Amer: 52 mL/min — ABNORMAL LOW (ref >60)
Sodium: 137 mEq/L (ref 135–145)

## 2010-12-25 LAB — URINALYSIS, MICROSCOPIC ONLY
Glucose, UA: NEGATIVE mg/dL
Leukocytes, UA: NEGATIVE
Specific Gravity, Urine: 1.017 (ref 1.005–1.030)
pH: 5.5 (ref 5.0–8.0)

## 2010-12-25 LAB — RAPID URINE DRUG SCREEN, HOSP PERFORMED: Barbiturates: NOT DETECTED

## 2010-12-25 LAB — GLUCOSE, CAPILLARY
Glucose-Capillary: 102 mg/dL — ABNORMAL HIGH (ref 70–99)
Glucose-Capillary: 119 mg/dL — ABNORMAL HIGH (ref 70–99)

## 2010-12-25 LAB — HOMOCYSTEINE: Homocysteine: 14.8 umol/L (ref 4.0–15.4)

## 2010-12-26 LAB — LUPUS ANTICOAGULANT PANEL: Lupus Anticoagulant: NOT DETECTED

## 2010-12-26 LAB — PROTEIN C ACTIVITY: Protein C Activity: 177 % — ABNORMAL HIGH (ref 75–133)

## 2010-12-26 LAB — CARDIOLIPIN ANTIBODIES, IGG, IGM, IGA: Anticardiolipin IgA: 16 APL U/mL (ref <22)

## 2010-12-26 LAB — BETA-2-GLYCOPROTEIN I ABS, IGG/M/A: Beta-2-Glycoprotein I IgA: 9 A Units (ref <20)

## 2010-12-26 LAB — ANTITHROMBIN III: AntiThromb III Func: 93 % (ref 76–126)

## 2010-12-29 LAB — PROTHROMBIN GENE MUTATION

## 2010-12-29 LAB — PROTEIN S, TOTAL: Protein S Ag, Total: 154 % — ABNORMAL HIGH (ref 60–150)

## 2010-12-29 LAB — PROTEIN C, TOTAL: Protein C, Total: 120 % (ref 72–160)

## 2010-12-30 ENCOUNTER — Telehealth: Payer: Self-pay | Admitting: Family Medicine

## 2010-12-30 LAB — FACTOR 5 LEIDEN

## 2010-12-30 NOTE — Telephone Encounter (Signed)
[name] with [facility] called to say that Ms. Goostree bp level was 140/100.  Pt is asymptomatic at this time but due to hx of 5 strokes need to have meds reassessed.  Please [name] of there is any further questions.

## 2011-01-01 NOTE — Telephone Encounter (Signed)
Relayed message from MD to [name].  [name] would like to inquire if maybe pt can be put back on hydrolozine again.  Advised I would send message to MD to discuss at pt appt on Monday [name], [name]

## 2011-01-01 NOTE — Telephone Encounter (Signed)
This BP is consistent with previous BPs.  Pt needs to be taking all of her medications every day, see back in clinci as scheduled.

## 2011-01-05 ENCOUNTER — Encounter: Payer: Self-pay | Admitting: Family Medicine

## 2011-01-05 ENCOUNTER — Ambulatory Visit (INDEPENDENT_AMBULATORY_CARE_PROVIDER_SITE_OTHER): Payer: Medicaid Other | Admitting: Family Medicine

## 2011-01-05 DIAGNOSIS — R1110 Vomiting, unspecified: Secondary | ICD-10-CM

## 2011-01-05 DIAGNOSIS — I6350 Cerebral infarction due to unspecified occlusion or stenosis of unspecified cerebral artery: Secondary | ICD-10-CM

## 2011-01-05 DIAGNOSIS — R0989 Other specified symptoms and signs involving the circulatory and respiratory systems: Secondary | ICD-10-CM

## 2011-01-05 DIAGNOSIS — E785 Hyperlipidemia, unspecified: Secondary | ICD-10-CM

## 2011-01-05 DIAGNOSIS — I10 Essential (primary) hypertension: Secondary | ICD-10-CM

## 2011-01-05 MED ORDER — ONDANSETRON HCL 4 MG PO TABS: 4.0000 mg | ORAL_TABLET | Freq: Three times a day (TID) | ORAL | Status: DC | PRN

## 2011-01-05 MED ORDER — ALBUTEROL SULFATE HFA 108 (90 BASE) MCG/ACT IN AERS: 2.0000 | INHALATION_SPRAY | RESPIRATORY_TRACT | Status: DC | PRN

## 2011-01-05 NOTE — Patient Instructions (Signed)
I would like to see you back in 2-4 weeks to check on your blood pressure  I sent in some zofran for nausea and albuterol for the cough.

## 2011-01-06 NOTE — Assessment & Plan Note (Signed)
Considerably improved.  Monitor cough in case related to lisinopril.  Would increase metop if still elevated at next visit.  Plan to follow pt closely and titrate meds.

## 2011-01-06 NOTE — Assessment & Plan Note (Signed)
Recent hosp eval for stroke.  Updated lipids, still on simvastatin

## 2011-01-06 NOTE — Progress Notes (Signed)
  Subjective:    Patient ID: Lindsey [name], female    DOB: September 04, 1960, 50 y.o.   MRN: [id number]  HPI Hospital f/u.  Has [facility] nurse helping with meds and taking BPs.  Still with some nausea (reglan is helping).  Also with some dry cough, she states this is due to a URI that she had coming in to hospital.    Review of Systems Denies HA, dizziness, SOB    Objective:   Physical Exam Vital signs reviewed General appearance - alert, well appearing, and in no distress and oriented to person, place, and time Heart - normal rate, regular rhythm, normal S1, S2, no murmurs, rubs, clicks or gallops Chest - clear to auscultation, no wheezes, rales or rhonchi, symmetric air entry, no tachypnea, retractions or cyanosis Abdomen - soft, nontender, nondistended, no masses or organomegaly Extremities - peripheral pulses normal, no pedal edema, no clubbing or cyanosis        Assessment & Plan:

## 2011-01-06 NOTE — Assessment & Plan Note (Signed)
Seen by neuro in hospital.  Added aggrenox, no other changes.

## 2011-01-07 NOTE — Consult Note (Signed)
Lindsey [name], Lindsey [name]                ACCOUNT NO.:  [id number]  MEDICAL RECORD NO.:  [id number]  LOCATION:  3707                         FACILITY:  [facility]  PHYSICIAN:  Thana Farr, MD    DATE OF BIRTH:  Jun 10, 1961  DATE OF CONSULTATION:  12/24/2010 DATE OF DISCHARGE:                                CONSULTATION   REASON FOR CONSULTATION:  Left-sided arm weakness and numbness.  Time is 12 o'clock.  HISTORY OF PRESENT ILLNESS:  This is a pleasant 50 year old African American female who was brought to the hospital on December 23, 2010, for numbness in her left arm and left leg x2 days as well as frontal headache.  The patient states that over the last 2 days, she noted decreased sensation and tingling in her left face, left arm, and left leg.  She denies any left truncal abnormalities.  Upon admission, the patient stated that she recently had a high temperature of almost 102; however, on admission her temperature has been stable at 98.5.  The patient states that her headache is very indicative of headache when she has had her migraine headaches.  It is over the left side frontal and continuous.  Due to her deficits and headache, the patient was brought for MRI, and Neurology was consulted for further evaluation and possibility of stroke.  At present time, the patient has just returned from the MRI, which does show a small infarct in the right putamen along with central corpus callosum.  PAST MEDICAL HISTORY: 1. Hypertension. 2. Chronic kidney disease. 3. Stroke. 4. Neuropathy. 5. Sleep apnea. 6. Chiari 1 malformation. 7. Status post decompression in 2002. 8. Migraine headaches. 9. Hyperlipidemia. 10.Breast reduction surgery. 11.Three C-sections.  MEDICATIONS:  She is on aspirin, Plavix, amlodipine, Neurontin, hydralazine, hydrochlorothiazide, lisinopril, Reglan, Toprol, simvastatin, and Toradol.  ALLERGIES:  She is allergic to CONTRAST DYE.  SOCIAL HISTORY:  She is  divorced.  She lives with her children.  She is a nonsmoker.  She denies drug or alcohol use.  REVIEW OF SYSTEMS:  Positive for headaches, left arm and leg numbness and tingling, generalized weakness.  Otherwise, negative with the exception of above.  PHYSICAL EXAMINATION:  VITALS:  Temperature of 98.5, pulse 79, respirations 18, blood pressures range between 151-176 over 98-106, O2 sats are 98% on room air. NEURO:  Pupils are equal, round, and reactive to light and accommodating.  Conjugate.  Extraocular movements are intact.  Visual field is grossly intact.  The patient shows no nystagmus.  Face is symmetrical.  Tongue is midline.  Uvula is midline.  The patient shows no dysarthria, aphasia, or slurred speech.  Facial sensation stated to be decreased along the V1, V2, and V3 region of her face.  Shoulder shrug and head turn is within normal limits.  Coordination:  Finger-to- nose, heel-to-shin are smooth.  Fine motor movements within normal limits.  Gait:  The patient shows no abnormalities in her gait.  Motor: The patient has 5/5 strength throughout.  Deep tendon reflexes 2+ throughout.  Downgoing toes bilaterally.  Drift:  The patient shows no drift in the upper or lower extremities.  Sensation:  The patient has decreased sensation in her left  arm and leg to pinprick and light touch. PULMONARY:  Clear to auscultation. CARDIOVASCULAR:  S1 and S2 is audible. NECK:  Negative for bruits and supple.  LABORATORY DATA:  Sodium 138, potassium 3.4, chloride 102, CO2 of 26, BUN 13, creatinine 1.27, glucose is 93.  White blood cell count is 7.5, platelets 277, hemoglobin 10.6, hematocrit 30.6.  Triglycerides 94, cholesterol 166, HDL 37, LDL is 110.  IMAGING:  CT of the head showed no acute infarct.  She does have bilateral basal ganglia and left cerebellar lacuna infarcts, which are remote and it also showed status post suboccipital decompression.  MRI of brain shows a right putamen and  right splenium of corpus callosum infarct.  MRA of brain shows no large vessel occlusion, hypoplastic right vertebral artery.  ASSESSMENT:  This is a pleasant 50 year old obese African American female with previous history of strokes.  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  Although this may be small vessel disease, due to her age and previous infarcts, highly recommend a stroke in the young work up as well.  RECOMMENDATIONS: 1. Hypercoagulable panel. 2. A 2-D echo, carotid Doppler, fasting lipid panel, HbA1c. 3. If 2-D echo, carotid Dopplers, and hypercoagulable panel are     negative, would recommend changing the patient from aspirin and     Plavix to Aggrenox.  If 2-D echo shows any abnormalities, we will     make further recommendations at that time.  We will continue to follow this patient while she is in the hospital.  Dr. Thad Ranger has seen and evaluated the patient, and agrees with the above-mentioned.     Felicie Morn, PA-C   ______________________________ Thana Farr, MD    DS/MEDQ  D:  12/24/2010  T:  12/25/2010  Job:  [id number]  Electronically Signed by Felicie Morn PA-C on 12/29/2010 01:17:07 PM Electronically Signed by Thana Farr MD on 01/07/2011 12:21:13 PM

## 2011-01-08 NOTE — Discharge Summary (Signed)
Lindsey [name], Lindsey [name]                ACCOUNT NO.:  [id number]  MEDICAL RECORD NO.:  [id number]  LOCATION:                                 FACILITY:  PHYSICIAN:  [name], M.D.DATE OF BIRTH:  [birth year]  DATE OF ADMISSION:  12/23/2010 DATE OF DISCHARGE:  12/25/2010                              DISCHARGE SUMMARY   DISCHARGE DIAGNOSES: 1. 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 2. History of CVA. 3. Hypertension. 4. Chronic kidney disease. 5. Neuropathy. 6. Sleep apnea. 7. Chiari I malformation. 8. History of migraines. 9. Hyperlipidemia. 10.Obesity. 11.Chronic vertigo.  DISCHARGE MEDICATIONS: 1. Aggrenox 25/200 mg capsule p.o. b.i.d. 2. Neurontin 300 mg p.o. daily at bedtime. 3. Lisinopril 40 mg p.o. daily. 4. Albuterol 2.5 mg inhaled q.4 hour p.r.n. 5. Reglan 5 mg p.o. q.a.c. and at bedtime. 6. Metoprolol 50 mg XL p.o. daily. 7. Amlodipine 10 mg p.o. daily. 8. Hydrochlorothiazide 25 mg p.o. daily. 9. Iron complex 150 mg capsule 2 tablets p.o. daily. 10.Zofran 4 mg p.o. q.4 h. p.r.n. 11.Sertraline 50 mg p.o. daily. 12.Simvastatin p.o. daily. 13.Tylenol 650 mg p.o. q.6 h. p.r.n. 14.Trazodone 50 mg p.o. daily at bedtime p.r.n.  MEDICATIONS DISCONTINUED: 1. Hydralazine 50 mg p.o. t.i.d. 2. Plavix 75 mg p.o. daily. 3. Aspirin 81 mg p.o. daily. 4. Metoprolol 12.5 mg p.o. b.i.d. 5. Lisinopril 10 mg p.o. daily.  LABS AND STUDIES: 1. UDS negative. 2. A1c 5.6. 3. TSH 2.09. 4. LDL 110, HDL 37, triglyceride 94. 5. Creatinine at discharge 1.32. 6. Hypercoagulability panel negative. 7. 2-D echocardiogram showed EF 65%-70% with normal wall motion and     grade 1 diastolic dysfunction and no source of embolism. 8. Carotid Doppler shows no significant stenoses and right vertebral     antegrade flow. 9. CT head shows no acute findings and bilateral basal ganglia and     left cerebellar lacunar remote infarct and status post suboccipital     decompression. 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  BRIEF HOSPITAL COURSE:  This is a 50 year old female with a history of CVA and medical noncompliance, who presents with acute left-sided weakness and found to have new CVA. 1. CVA.  The patient presented subacutely with     left-sided numbness and this was stable throughout hospitalization.  She displayed no focal motor weakness or speech difficulty.  Neurology was consulted. MRI showed new CVA so the patient underwent stroke workup including carotid Dopplers, echo, and  lab     findings which were not remarkable.  The patient was switched from     aspirin and Plavix to Aggrenox.  Her risk factors included obesity,     hypertension, and hyperlipidemia.  The patient will follow up with     home health PT and OT.  She will likely  need a referral for     Vestibular/Neuro Rehab as an outpatient, after the patient is no     longer homebound. 2. Hypertension.  The patient was hypertensive on admission with blood     pressures as high as 160s/120s.  This was secondary to medication     noncompliance.  She was restarted on her prescribed doses of     metoprolol, amlodipine, HCTZ, and lisinopril was increased from 10     mg to 40 mg.  These were started stepwise to avoid over treatment     given her noncompliance.  Her blood pressures had improved on the     day of discharge with value of 145/98.  She may follow up with     her PCP regarding additional therapy. 3. Hyperlipidemia, questionable compliance on statin medication.  This     was restarted during hospitalization and LDL was 110. 4. CKD.  The patient was noted to have moderately elevated  creatinine     of 1.5 from her baseline of 1.2.  This had improved with fluid     hydration and was 1.32 at time of discharge.  DISCHARGE INSTRUCTIONS:  The patient was discharged to home with instructions to consume heart-healthy diet and to use her walker, which was prescribed at discharge.  She is to call MD or return to ER for worsening symptoms, one-sided weakness, numbness, or severe headache.  FOLLOWUP ISSUES: 1. The patient will require outpatient referral to Neuro Vestibular     Rehab for chronic vertigo. 2. Blood pressure management. 3. Medication compliance.  DISCHARGE APPOINTMENTS:  With Dr. Ellery Plunk at [facility] on July 2 at 4 p.m.  DISCHARGE CONDITION:  The patient was discharged to home in stable medical condition with home health PT/OT and safety evaluation to be performed.    ______________________________ Lloyd Huger, MD   ______________________________ [name], M.D.    JK/MEDQ  D:  12/26/2010  T:  12/27/2010  Job:  [id number]  Electronically Signed by Lloyd Huger MD on 01/02/2011 02:34:32 PM Electronically Signed by [name] M.D. on 01/08/2011 10:19:01 AM

## 2011-01-14 ENCOUNTER — Telehealth: Payer: Self-pay | Admitting: Family Medicine

## 2011-01-14 NOTE — Telephone Encounter (Signed)
Family is trying to keep pt from going to nursing home and needs CCME completed for [facility],  Placed form in [facility] box.

## 2011-01-15 NOTE — Telephone Encounter (Signed)
Pt needs an appt to fill this out

## 2011-01-15 NOTE — Telephone Encounter (Signed)
Attempted to call patient but not answer.  Will forward to [facility]

## 2011-01-26 NOTE — H&P (Signed)
[name], Lindsey [name]                ACCOUNT NO.:  [id number]  MEDICAL RECORD NO.:  [id number]  LOCATION:  3707                         FACILITY:  [facility]  PHYSICIAN:  Lindsey Lindsey [name], Lindsey HaynesDATE OF BIRTH:  [birth year]  DATE OF ADMISSION:  12/23/2010 DATE OF DISCHARGE:                             HISTORY & PHYSICAL   PRIMARY CARE PHYSICIAN:  Lindsey Lindsey [name], Lindsey Lindsey [name], Lindsey Lindsey [name].  CHIEF COMPLAINT:  Arm and leg numbness.  HISTORY OF PRESENT ILLNESS:  Lindsey Lindsey [name] is a 50 year old female who presents with numbness in her left arm and left leg x2 days as well as some frontal headache.  She also complains of persistent cough and fever to 102.  This cough is productive of white sputum at home.  She has also noticed some blood in her sputum over the last day.  The patient states that the numbness in her left arm and leg began acutely while she was sitting on her porch at home.  She states that it felt like someone was sitting on her leg.  She did not have any weakness or dizziness at that time.  Her headache has been present for 2-3 days per the patient.  She states that the frontal location is similar to her usual migraine pain. She states that is persistent, but not getting better or worse.  She also complains of some chronic nausea and has a poor p.o. intake due to this.  She thinks Phenergan has helped her with this in the past.  Since she has been in the ED, she had a CT head which did not show any changes from previous.  She also received [name] albuterol nebs that she says it helps with her cough.  The patient states she is taking all of her medicines, but she cannot name them.  She states that she is taking aspirin and Plavix when I asked.  PAST MEDICAL HISTORY: 1. Hypertension. 2. Chronic kidney disease. 3. Stroke. 4. Neuropathy. 5. Sleep apnea. 6. Chiari I malformation. 7. Migraine. 8. Hyperlipidemia.  PAST SURGICAL HISTORY:  Brain surgery, decompression of Chiari  I malformation in 2002, breast reduction surgery, and 3 cesarean sections.  SOCIAL HISTORY:  She is divorced.  She lives with her children.  She is not a smoker.  She is not sexually active.  She denies drug or alcohol use.  FAMILY HISTORY:  Significant for hyperlipidemia, heart disease, and hypertension in her mother and heart disease in multiple of her siblings.  ALLERGIES:  She is allergic to CONTRAST DYE.  CURRENT MEDICATIONS:  Per her records: 1. Amlodipine 10 mg p.o. daily. 2. Gabapentin 300 mg p.o. nightly. 3. Hydralazine 50 mg 3 times a day. 4. Hydrochlorothiazide 25 mg p.o. daily. 5. Lisinopril 20 mg p.o. daily. 6. Reglan 5 mg q.i.d., 30 minutes before meals and at bedtime. 7. Toprol-XL 50 mg p.o. daily. 8. Simvastatin 40 mg p.o. daily. 9. Tramadol 50 mg p.o. q.6 h. p.r.n. pain.  REVIEW OF SYSTEMS:  Per HPI with the following additions; nausea, poor oral intake, otherwise 12-point review of systems performed are unremarkable.  PHYSICAL EXAMINATION:  VITAL SIGNS:  Pulse 90, blood pressure 160/117, respirations 20, O2 sat 96% on room  air, temperature 99.3. GENERAL:  She is alert, cooperative, appears her stated age, moderately obese. HEENT:  Pupils equal, round, and reactive to light.  Extraocular muscles intact.  Sclera clear, anicteric.  Oropharynx clear.  No lesions. NECK:  Supple with midline trachea.  Thyroid without masses. HEART:  S1, S2 normal.  No murmurs, rubs, or gallops.  Regular rate and rhythm. LUNGS:  Clear to auscultation.  No wheeze or rales, unlabored breathing. The patient does have nasal drainage, which appears clear. ABDOMEN:  Soft without significant tenderness, masses, organomegaly, or guarding. EXTREMITIES:  Normal, atraumatic.  No cyanosis or edema.  Some dry skin noted on the ankles. NEUROLOGIC:  Normal.  No focal findings.  Mental status grossly intact. Speech is normal.  No drooping of her face.  Cranial nerves II through XII intact.   She is alert and oriented x3.  She has normal muscle tone and strength bilaterally.  Normal sensation grossly as well.  LABS AND IMAGING:  BMET was significant for a potassium just slightly low at 3.4, creatinine elevated at 1.52.  CBC showed a white count of 7.7, hemoglobin 11.1, platelets 305.  CT scan of the brain showed no acute findings, bilateral basal ganglia and left cerebellar lacunar infarcts remote and status post suboccipital decompression.  Chest x- ray, no acute findings.  Last echo was 111, [name] EF was 60% with no wall motion abnormalities or valve abnormalities.  At that time, the patient also had a Myoview which did not show any ischemia.  ASSESSMENT AND PLAN:  Lindsey Lindsey [name] is a 50 year old female, presenting with symptoms concerning for stroke. 1. Extremity, numbness, paresthesia.  No CVA was seen on CT.  We will     obtain MRI as well as get CVA testing including TSH, A1c and     fasting lipid panel.  We will not get echo since the patient had     one in January 2011 with no new cardiac findings.  We need to get     Neuro involved in the Lindsey [name] because the patient has poor regular     followup and a long history of imbalance, headache, nausea after     previous her CVAs.  I restarted her aspirin and Plavix because they     were on the last discharge, but they were not on the home med list.     Leaving many things, she has not likely been taking them. 2. Hypertension.  The patient with poor compliance of medications.     Restart home meds slowly, bring her blood pressure down.  The     patient typically does well in the Lindsey [name] on these meds.  I held     her hydralazine since her last office visit, she stated she was not     taking it and her blood pressure was good without it.  We can     titrate meds in the Lindsey [name]. 3. Hyperlipidemia.  Continue statin.  Check fasting lipid panel. 4. Headache.  May be migraine or a result of stroke.  Avoid     vasoconstrictive  medications like triptans and serotonin agonists.     We will try Percocet for pain relief overnight. 5. Cough.  Chest x-ray is clear.  Currently, afebrile and white count     was normal, monitor clinically.  The patient felt better with     albuterol, so we will continue that overnight. 6. Acute-on-chronic kidney disease.  The patient with baseline  creatinine of 1.2, now 1.5.  We will give gentle hydration and     monitor creatinine.  Replace potassium.  The patient may be volume     depleted, has not been eating well due to nausea.  Check UA. 7. Nausea.  Zofran.  Restart Reglan per home meds, this has helped in     the past.  She denies constipation. 8. Fluids, electrolytes, nutrition/gastrointestinal.  Heart-healthy     diet.  IV fluids at 100 mL per hour. 9. Prophylaxis.  Lovenox and Protonix. 10.Disposition.  Pending further imaging and clinical improvement.     Lindsey Lindsey [name], Lindsey Lindsey [name]   ______________________________ Lindsey Lindsey [name], M.D.    RS/MEDQ  D:  12/24/2010  T:  12/24/2010  Job:  [id number]  Electronically Signed by Lindsey Lindsey [name]  on 01/14/2011 11:45:26 AM Electronically Signed by Lindsey Lindsey [name] M.D. on 01/26/2011 01:59:12 PM

## 2011-01-29 ENCOUNTER — Other Ambulatory Visit: Payer: Self-pay | Admitting: Family Medicine

## 2011-01-29 NOTE — Telephone Encounter (Signed)
Refill request

## 2011-03-30 ENCOUNTER — Encounter: Payer: Self-pay | Admitting: Family Medicine

## 2011-03-30 ENCOUNTER — Ambulatory Visit (INDEPENDENT_AMBULATORY_CARE_PROVIDER_SITE_OTHER): Payer: Medicaid Other | Admitting: Family Medicine

## 2011-03-30 DIAGNOSIS — I10 Essential (primary) hypertension: Secondary | ICD-10-CM

## 2011-03-30 DIAGNOSIS — H9209 Otalgia, unspecified ear: Secondary | ICD-10-CM

## 2011-03-30 MED ORDER — METOPROLOL SUCCINATE ER 25 MG PO TB24: 25.0000 mg | ORAL_TABLET | Freq: Every day | ORAL | Status: DC

## 2011-03-30 NOTE — Assessment & Plan Note (Signed)
No URI symptoms, no fever.  Cerumen impaction bilaterally.  Will have ears washed and see if improved.

## 2011-03-30 NOTE — Patient Instructions (Signed)
I am sorry your ears have been hurting.    Let me see you back in 1-2 weeks to recheck BP and recheck your ears.    Please imcrease your metoprolol to 75mg --one 50mg  pill and one 25mg  pill every day

## 2011-03-30 NOTE — Progress Notes (Signed)
  Subjective:    Patient ID: Lindsey [name], female    DOB: September 13, 1960, 50 y.o.   MRN: [id number]  HPI  Ear pain- bilateral x 1 week. No fevers, no URI symptoms.  Uses qtips and thinks that there has been a lot of wax there.  No tooth pain, no pain with swallowing, no jaw pain. Does not grind teeth  HTN- taking meds as written.  No HA, N/V or dizziness recently. No CP or SOB  Review of Systems See above    Objective:   Physical Exam  Vital signs reviewed General appearance - alert, well appearing, and in no distress  Heart - normal rate, regular rhythm, normal S1, S2, no murmurs, rubs, clicks or gallops Chest - clear to auscultation, no wheezes, rales or rhonchi, symmetric air entry, no tachypnea, retractions or cyanosis Eyes - pupils equal and reactive, extraocular eye movements intact, sclera anicteric Ears - bilateral canals with cerumen impaction Nose - normal and patent, no erythema, discharge or polyps        Assessment & Plan:  Ear pain No URI symptoms, no fever.  Cerumen impaction bilaterally.  Will have ears washed and see if improved.  ESSENTIAL HYPERTENSION, BENIGN BP Readings from Last 3 Encounters:  03/30/11 165/102  01/05/11 150/97  10/13/10 150/94   BP is similar to last several visits but room for improvement.  Will increase metop to 75mg /day.  Have pt RTC in 1-2 weeks to check on BP

## 2011-03-30 NOTE — Assessment & Plan Note (Signed)
BP Readings from Last 3 Encounters:  03/30/11 165/102  01/05/11 150/97  10/13/10 150/94   BP is similar to last several visits but room for improvement.  Will increase metop to 75mg /day.  Have pt RTC in 1-2 weeks to check on BP

## 2011-03-31 LAB — BASIC METABOLIC PANEL
BUN: 11
Creatinine, Ser: 1.23 — ABNORMAL HIGH
GFR calc non Af Amer: 47 — ABNORMAL LOW
Potassium: 3.6

## 2011-03-31 LAB — URINE MICROSCOPIC-ADD ON

## 2011-03-31 LAB — URINALYSIS, ROUTINE W REFLEX MICROSCOPIC
Nitrite: POSITIVE — AB
Specific Gravity, Urine: 1.028
Urobilinogen, UA: 1

## 2011-03-31 LAB — CULTURE, ROUTINE-ABSCESS

## 2011-04-10 LAB — HOMOCYSTEINE: Homocysteine: 7.9

## 2011-04-10 LAB — PROTEIN C ACTIVITY: Protein C Activity: 167 % — ABNORMAL HIGH (ref 75–133)

## 2011-04-10 LAB — HEPATIC FUNCTION PANEL
AST: 13
Albumin: 3 — ABNORMAL LOW
Total Bilirubin: 0.4
Total Protein: 5.9 — ABNORMAL LOW

## 2011-04-10 LAB — RPR: RPR Ser Ql: NONREACTIVE

## 2011-04-10 LAB — CBC
Platelets: 341
WBC: 9.8

## 2011-04-10 LAB — URINALYSIS, ROUTINE W REFLEX MICROSCOPIC
Glucose, UA: NEGATIVE
Ketones, ur: NEGATIVE
Protein, ur: NEGATIVE

## 2011-04-10 LAB — ANA: Anti Nuclear Antibody(ANA): NEGATIVE

## 2011-04-10 LAB — URINE CULTURE: Colony Count: >=100000

## 2011-04-10 LAB — PROTEIN S, TOTAL: Protein S Ag, Total: 112 % (ref 70–140)

## 2011-04-10 LAB — I-STAT 8, (EC8 V) (CONVERTED LAB)
BUN: 14
Bicarbonate: 23.6
Chloride: 108
Hemoglobin: 13.3
Operator id: 234501
Sodium: 138

## 2011-04-10 LAB — LIPID PANEL
Cholesterol: 137
Total CHOL/HDL Ratio: 4.7
VLDL: 19

## 2011-04-10 LAB — FACTOR 5 LEIDEN

## 2011-04-10 LAB — LUPUS ANTICOAGULANT PANEL

## 2011-04-10 LAB — PROTHROMBIN GENE MUTATION

## 2011-04-10 LAB — URINE MICROSCOPIC-ADD ON

## 2011-04-10 LAB — PROTEIN S ACTIVITY: Protein S Activity: 74 % (ref 69–129)

## 2011-04-10 LAB — SEDIMENTATION RATE: Sed Rate: 30 — ABNORMAL HIGH

## 2011-04-10 LAB — DIFFERENTIAL
Lymphocytes Relative: 36
Lymphs Abs: 3.5
Neutro Abs: 5.5
Neutrophils Relative %: 56

## 2011-04-10 LAB — POCT I-STAT CREATININE: Creatinine, Ser: 1.4 — ABNORMAL HIGH

## 2011-04-10 LAB — CARDIOLIPIN ANTIBODIES, IGG, IGM, IGA: Anticardiolipin IgG: <7 — ABNORMAL LOW (ref <11)

## 2011-04-10 LAB — ANGIOTENSIN CONVERTING ENZYME: Angiotensin-Converting Enzyme: 16 U/L (ref 9–67)

## 2011-04-17 LAB — I-STAT 8, (EC8 V) (CONVERTED LAB)
Acid-base deficit: 1
BUN: 10
Bicarbonate: 21.9
Bicarbonate: 25.7 — ABNORMAL HIGH
Glucose, Bld: 97
HCT: 41
Operator id: 288831
TCO2: 23
TCO2: 27
pCO2, Ven: 37.5 — ABNORMAL LOW
pCO2, Ven: 47.1
pH, Ven: 7.375 — ABNORMAL HIGH

## 2011-04-17 LAB — DIFFERENTIAL
Basophils Absolute: 0
Basophils Relative: 0
Eosinophils Absolute: 0.2
Eosinophils Absolute: 0.2
Eosinophils Relative: 2
Lymphocytes Relative: 46
Monocytes Relative: 7
Neutrophils Relative %: 47
Neutrophils Relative %: 40 — ABNORMAL LOW

## 2011-04-17 LAB — CBC
HCT: 34.5 — ABNORMAL LOW
MCHC: 33.8
MCV: 86.9
MCV: 86.4
Platelets: 306
RBC: 4.26
RDW: 16 — ABNORMAL HIGH
RDW: 15.3 — ABNORMAL HIGH

## 2011-04-17 LAB — URINALYSIS, ROUTINE W REFLEX MICROSCOPIC
Glucose, UA: NEGATIVE
Ketones, ur: NEGATIVE
Protein, ur: NEGATIVE
Urobilinogen, UA: 0.2

## 2011-04-17 LAB — POCT CARDIAC MARKERS
CKMB, poc: <1 — ABNORMAL LOW
CKMB, poc: 1.2
Operator id: 234501
Troponin i, poc: <0.05
Troponin i, poc: <0.05
Troponin i, poc: <0.05

## 2011-04-17 LAB — POCT I-STAT CREATININE
Creatinine, Ser: 1
Creatinine, Ser: 1
Operator id: 277751
Operator id: 288831

## 2011-04-17 LAB — PROTIME-INR: INR: 1

## 2011-04-20 LAB — DIFFERENTIAL
Basophils Absolute: 0.1
Basophils Relative: 1
Basophils Relative: 1
Basophils Relative: 1
Eosinophils Absolute: 0.3
Eosinophils Absolute: 0.3
Eosinophils Relative: 2
Eosinophils Relative: 3
Lymphocytes Relative: 37
Lymphs Abs: 3.5 — ABNORMAL HIGH
Lymphs Abs: 6.3 — ABNORMAL HIGH
Monocytes Absolute: 0.4
Monocytes Absolute: 0.4
Monocytes Absolute: 0.5
Monocytes Relative: 4
Monocytes Relative: 8
Monocytes Relative: 4
Neutro Abs: 5.1
Neutro Abs: 5.2
Neutrophils Relative %: 52
Neutrophils Relative %: 62

## 2011-04-20 LAB — CK TOTAL AND CKMB (NOT AT ARMC)
CK, MB: 1.9
CK, MB: 3
CK, MB: 3.4
Relative Index: 1.5
Total CK: 193 — ABNORMAL HIGH

## 2011-04-20 LAB — RAPID URINE DRUG SCREEN, HOSP PERFORMED
Amphetamines: NOT DETECTED
Amphetamines: NOT DETECTED
Barbiturates: NOT DETECTED
Benzodiazepines: NOT DETECTED
Benzodiazepines: NOT DETECTED
Cocaine: NOT DETECTED
Cocaine: NOT DETECTED
Opiates: NOT DETECTED

## 2011-04-20 LAB — BASIC METABOLIC PANEL
CO2: 27
Calcium: 9.2
Chloride: 106
Creatinine, Ser: 1.02
GFR calc Af Amer: >60
Glucose, Bld: 91
Potassium: 3.4 — ABNORMAL LOW
Sodium: 138

## 2011-04-20 LAB — URINALYSIS, ROUTINE W REFLEX MICROSCOPIC
Bilirubin Urine: NEGATIVE
Glucose, UA: NEGATIVE
Hgb urine dipstick: NEGATIVE
Ketones, ur: NEGATIVE
Nitrite: NEGATIVE
Protein, ur: NEGATIVE
Protein, ur: NEGATIVE
Urobilinogen, UA: 0.2
Urobilinogen, UA: 1

## 2011-04-20 LAB — COMPREHENSIVE METABOLIC PANEL
ALT: 13
ALT: 17
AST: 15
AST: 20
Albumin: 3.6
Albumin: 3.8
Alkaline Phosphatase: 73
Alkaline Phosphatase: 91
BUN: 11
BUN: 13
CO2: 22
Calcium: 9.3
Creatinine, Ser: 0.84
GFR calc Af Amer: >60
GFR calc Af Amer: >60
GFR calc non Af Amer: >60
Glucose, Bld: 90
Glucose, Bld: 117 — ABNORMAL HIGH
Potassium: 4.9
Potassium: 3.4 — ABNORMAL LOW
Sodium: 139
Sodium: 140
Total Bilirubin: 1.4 — ABNORMAL HIGH
Total Protein: 6.9
Total Protein: 7.1
Total Protein: 7.6
Total Protein: 8

## 2011-04-20 LAB — CBC
HCT: 34.8 — ABNORMAL LOW
HCT: 35.4 — ABNORMAL LOW
Hemoglobin: 11.7 — ABNORMAL LOW
Hemoglobin: 12.5
Hemoglobin: 11.8 — ABNORMAL LOW
MCHC: 33.9
MCHC: 33.4
MCV: 86.7
MCV: 86.5
Platelets: 363
RBC: 4.11
RBC: 4.24
RDW: 15.4 — ABNORMAL HIGH
RDW: 15.7 — ABNORMAL HIGH
WBC: 9.3
WBC: 12.1 — ABNORMAL HIGH

## 2011-04-20 LAB — TROPONIN I
Troponin I: 0.01
Troponin I: 0.02
Troponin I: 0.04

## 2011-04-20 LAB — LIPID PANEL
HDL: 38 — ABNORMAL LOW
Total CHOL/HDL Ratio: 4.1
Triglycerides: 91

## 2011-04-20 LAB — APTT
aPTT: 28
aPTT: 23 — ABNORMAL LOW

## 2011-04-20 LAB — HOMOCYSTEINE: Homocysteine: 6.8

## 2011-04-20 LAB — PROTIME-INR
INR: 0.9
INR: 1
Prothrombin Time: 12.3
Prothrombin Time: 12.9

## 2011-04-20 LAB — URINE MICROSCOPIC-ADD ON

## 2011-04-20 LAB — PREGNANCY, URINE: Preg Test, Ur: NEGATIVE

## 2011-04-20 LAB — ETHANOL: Alcohol, Ethyl (B): <5

## 2011-04-27 ENCOUNTER — Emergency Department (HOSPITAL_COMMUNITY)
Admission: EM | Admit: 2011-04-27 | Discharge: 2011-04-27 | Disposition: A | Discharge status: Home / Self Care | Payer: Medicare Other | Attending: Emergency Medicine | Admitting: Emergency Medicine

## 2011-04-27 ENCOUNTER — Emergency Department (HOSPITAL_COMMUNITY): Payer: Medicare Other

## 2011-04-27 DIAGNOSIS — I672 Cerebral atherosclerosis: Secondary | ICD-10-CM | POA: Insufficient documentation

## 2011-04-27 DIAGNOSIS — Z8679 Personal history of other diseases of the circulatory system: Secondary | ICD-10-CM | POA: Insufficient documentation

## 2011-04-27 DIAGNOSIS — R197 Diarrhea, unspecified: Secondary | ICD-10-CM | POA: Insufficient documentation

## 2011-04-27 DIAGNOSIS — I10 Essential (primary) hypertension: Secondary | ICD-10-CM | POA: Insufficient documentation

## 2011-04-27 DIAGNOSIS — I6529 Occlusion and stenosis of unspecified carotid artery: Secondary | ICD-10-CM | POA: Insufficient documentation

## 2011-04-27 DIAGNOSIS — R42 Dizziness and giddiness: Secondary | ICD-10-CM | POA: Insufficient documentation

## 2011-04-27 DIAGNOSIS — R51 Headache: Secondary | ICD-10-CM | POA: Insufficient documentation

## 2011-04-27 DIAGNOSIS — R112 Nausea with vomiting, unspecified: Secondary | ICD-10-CM | POA: Insufficient documentation

## 2011-04-27 DIAGNOSIS — I6990 Unspecified sequelae of unspecified cerebrovascular disease: Secondary | ICD-10-CM | POA: Insufficient documentation

## 2011-04-27 LAB — CBC
Hemoglobin: 11.9 g/dL — ABNORMAL LOW (ref 12.0–15.0)
MCH: 27.5 pg (ref 26.0–34.0)
MCHC: 34.9 g/dL (ref 30.0–36.0)
Platelets: 316 10*3/uL (ref 150–400)

## 2011-04-27 LAB — URINE MICROSCOPIC-ADD ON

## 2011-04-27 LAB — URINALYSIS, ROUTINE W REFLEX MICROSCOPIC
Leukocytes, UA: NEGATIVE
Nitrite: NEGATIVE
Specific Gravity, Urine: >1.03 — ABNORMAL HIGH (ref 1.005–1.030)
pH: 6 (ref 5.0–8.0)

## 2011-04-27 LAB — COMPREHENSIVE METABOLIC PANEL
ALT: 27 U/L (ref 0–35)
Calcium: 9.6 mg/dL (ref 8.4–10.5)
GFR calc Af Amer: 56 mL/min — ABNORMAL LOW (ref >90)
Glucose, Bld: 83 mg/dL (ref 70–99)
Sodium: 141 mEq/L (ref 135–145)
Total Protein: 7.8 g/dL (ref 6.0–8.3)

## 2011-04-27 MED ORDER — GADOBENATE DIMEGLUMINE 529 MG/ML IV SOLN
15.0000 mL | Freq: Once | INTRAVENOUS | Status: AC
  Administered 2011-04-27: 15 mL via INTRAVENOUS

## 2011-04-29 ENCOUNTER — Ambulatory Visit (INDEPENDENT_AMBULATORY_CARE_PROVIDER_SITE_OTHER): Payer: Medicare Other | Admitting: Family Medicine

## 2011-04-29 ENCOUNTER — Encounter: Payer: Self-pay | Admitting: Family Medicine

## 2011-04-29 DIAGNOSIS — R42 Dizziness and giddiness: Secondary | ICD-10-CM

## 2011-04-29 DIAGNOSIS — I10 Essential (primary) hypertension: Secondary | ICD-10-CM

## 2011-04-29 DIAGNOSIS — R1110 Vomiting, unspecified: Secondary | ICD-10-CM

## 2011-04-29 MED ORDER — PROMETHAZINE HCL 12.5 MG RE SUPP: 12.5000 mg | Freq: Four times a day (QID) | RECTAL | Status: AC | PRN

## 2011-04-29 MED ORDER — ONDANSETRON HCL 4 MG PO TABS: 4.0000 mg | ORAL_TABLET | Freq: Three times a day (TID) | ORAL | Status: DC | PRN

## 2011-04-29 NOTE — Progress Notes (Signed)
Here for emergency room followup. Was seen in emergency room yesterday for vertigo and vomiting. Her symptoms started Monday with acute onset of vertigo. She notes that her vertigo is worse when she moves her head.  In the emergency room she had a head CT scan.  She was given Zofran and asked to followup with her neurologist. This morning she notes that she was so nauseated with her vertigo that she was unable to keep her blood pressure medications down.  Thus her blood pressure is elevated today.  Aside from vertigo she feels well she denies any chest pains palpitations dyspnea headache numbness tingling weakness lack of coordination change in swallow or speech.    Her vertigo occurs when she moves her head.  At rest she has no vertigo  PMH reviewed.  ROS as above otherwise neg Medications reviewed. (Unable to take any medications today due to vomiting).  Exam:  BP 170/128  Pulse 71  Ht 4\' 11"  (1.499 m)  Wt 194 lb (87.998 kg)  BMI 39.18 kg/m2  LMP 03/30/2011 Gen: Well NAD HEENT: EOMI,  MMM Lungs: CTABL Nl WOB Heart: RRR no MRG Abd: NABS, NT, ND Exts: Non edematous BL  LE, warm and well perfused.  Neuro: Alert and Oriented x3, cranial nerves II through XII are intact. Normal extraocular motion. No nystagmus. Sensation strength and coordination are intact. Normal gait. Normal finger-nose-finger. When asked to sit back and turn had to move slowly down and has no nystagmus, or vertigo

## 2011-04-29 NOTE — Assessment & Plan Note (Signed)
Significantly elevated blood pressure today. This is due to inability to keep down her blood pressure medicines. Not an emergency currently she has no symptoms. Plan to provide antiemetics for nausea. Also recommended that she take her Toprol tonight. She will resume her normal blood pressure medications tomorrow morning. Red flags reviewed with patient.

## 2011-04-29 NOTE — Assessment & Plan Note (Signed)
Likely due to benign paroxysmal positional vertigo. Resulted in nausea. However she does have a history of stroke and certainly could be having a cerebellar infarct. CT scan did not show evidence of cerebellar infarct and her neuro exam is not consistent with significant defect.  Plan to follow up in 2 days and refer to neurology. Red flags reviewed with patient

## 2011-04-29 NOTE — Patient Instructions (Signed)
Thank you for coming in today. Get an appointment with urologist tomorrow or Friday I refilled your prescription for Zofran and wrote for Phenergan suppository. Use these for vomiting. Please take your metoprolol when you get home tonight. Come back for an appointment on Friday to recheck your blood pressure and your symptoms. If her symptoms worsen we note weakness numbness loss of coordination of like you to go to the emergency room.

## 2011-06-18 ENCOUNTER — Encounter (HOSPITAL_COMMUNITY): Payer: Self-pay | Admitting: Emergency Medicine

## 2011-06-18 ENCOUNTER — Emergency Department (HOSPITAL_COMMUNITY)
Admission: EM | Admit: 2011-06-18 | Discharge: 2011-06-19 | Disposition: A | Discharge status: Home / Self Care | Payer: Medicare Other | Attending: Emergency Medicine | Admitting: Emergency Medicine

## 2011-06-18 DIAGNOSIS — I129 Hypertensive chronic kidney disease with stage 1 through stage 4 chronic kidney disease, or unspecified chronic kidney disease: Secondary | ICD-10-CM | POA: Insufficient documentation

## 2011-06-18 DIAGNOSIS — N189 Chronic kidney disease, unspecified: Secondary | ICD-10-CM | POA: Insufficient documentation

## 2011-06-18 DIAGNOSIS — R5381 Other malaise: Secondary | ICD-10-CM | POA: Insufficient documentation

## 2011-06-18 DIAGNOSIS — E785 Hyperlipidemia, unspecified: Secondary | ICD-10-CM | POA: Insufficient documentation

## 2011-06-18 DIAGNOSIS — Z8673 Personal history of transient ischemic attack (TIA), and cerebral infarction without residual deficits: Secondary | ICD-10-CM | POA: Insufficient documentation

## 2011-06-18 DIAGNOSIS — Z79899 Other long term (current) drug therapy: Secondary | ICD-10-CM | POA: Insufficient documentation

## 2011-06-18 DIAGNOSIS — R42 Dizziness and giddiness: Secondary | ICD-10-CM | POA: Insufficient documentation

## 2011-06-18 HISTORY — DX: Cerebral infarction, unspecified: I63.9

## 2011-06-18 LAB — POCT I-STAT, CHEM 8
BUN: 14 mg/dL (ref 6–23)
Calcium, Ion: 1.2 mmol/L (ref 1.12–1.32)
Chloride: 106 mEq/L (ref 96–112)
Potassium: 4.4 mEq/L (ref 3.5–5.1)
Sodium: 141 mEq/L (ref 135–145)

## 2011-06-18 MED ORDER — MECLIZINE HCL 25 MG PO TABS
25.0000 mg | ORAL_TABLET | Freq: Once | ORAL | Status: AC
  Administered 2011-06-18: 25 mg via ORAL
  Filled 2011-06-18: qty 1

## 2011-06-18 MED ORDER — SODIUM CHLORIDE 0.9 % IV BOLUS (SEPSIS)
1000.0000 mL | Freq: Once | INTRAVENOUS | Status: AC
  Administered 2011-06-18: 1000 mL via INTRAVENOUS

## 2011-06-18 MED ORDER — ONDANSETRON HCL 4 MG/2ML IJ SOLN
4.0000 mg | Freq: Once | INTRAMUSCULAR | Status: AC
  Administered 2011-06-18: 4 mg via INTRAVENOUS
  Filled 2011-06-18: qty 2

## 2011-06-18 NOTE — ED Provider Notes (Signed)
History     CSN: [id number] Arrival date & time: 06/18/2011  8:07 PM   First MD Initiated Contact with Patient 06/18/11 2140      Chief Complaint  Patient presents with  . Fatigue    (Consider location/radiation/quality/duration/timing/severity/associated sxs/prior treatment) HPI  Pt has a 1 month history of dizziness, general weakness with vomiting for 1 month. She has seen doctor Earma Reading 1 month ago for the same who prescribed her antivert. She says that the dizziness never went away and has only continued to get worse. Today she comes in because she felt as though she could not walk straight. She did not take her antivert nor her nausea medication today. She denies focal weakness, headache, chest pain, SOB, N/V/D.  Past Medical History  Diagnosis Date  . Hypertension   . Chronic kidney disease   . Stroke   . Neuropathy   . Sleep apnea   . Chiari I malformation   . Migraine   . Hyperlipidemia   . CVA (cerebral infarction)     Past Surgical History  Procedure Date  . Brain surgery   . Breast surgery   . Cesarean section     Family History  Problem Relation Age of Onset  . Hyperlipidemia Mother   . Heart disease Mother   . Hypertension Mother   . Heart disease Sister   . Heart disease Brother     History  Substance Use Topics  . Smoking status: Never Smoker   . Smokeless tobacco: Not on file  . Alcohol Use: No    OB History    Grav Para Term Preterm Abortions TAB SAB Ect Mult Living                  Review of Systems  All other systems reviewed and are negative.    Allergies  Iodine  Home Medications   Current Outpatient Rx  Name Route Sig Dispense Refill  . ACETAMINOPHEN 500 MG PO TABS Oral Take 500 mg by mouth every 6 (six) hours as needed. For pain     . ALBUTEROL SULFATE HFA 108 (90 BASE) MCG/ACT IN AERS Inhalation Inhale 2 puffs into the lungs every 4 (four) hours as needed. For wheezing     . AMLODIPINE BESYLATE 10 MG PO TABS Oral  Take 10 mg by mouth daily.      . ASPIRIN-DIPYRIDAMOLE 25-200 MG PO CP12 Oral Take 1 capsule by mouth 2 (two) times daily.      Marland Kitchen GABAPENTIN 100 MG PO CAPS Oral Take 300 mg by mouth at bedtime.      Marland Kitchen HYDROCHLOROTHIAZIDE 25 MG PO TABS Oral Take 25 mg by mouth daily.      Marland Kitchen POLYSACCHARIDE IRON COMPLEX 150 MG PO CAPS Oral Take 150 mg by mouth 2 (two) times daily.      Marland Kitchen LISINOPRIL 20 MG PO TABS Oral Take 40 mg by mouth daily.      Marland Kitchen METOCLOPRAMIDE HCL 10 MG PO TABS Oral Take 5 mg by mouth 4 (four) times daily. take one 30 min before meals and at bedtime     . METOPROLOL SUCCINATE ER 50 MG PO TB24 Oral Take 50 mg by mouth every morning.      Marland Kitchen METOPROLOL SUCCINATE ER 25 MG PO TB24 Oral Take 25 mg by mouth every morning.      Marland Kitchen ONDANSETRON HCL 4 MG PO TABS Oral Take 4 mg by mouth every 8 (eight) hours as needed. For nausea/vomiting     .  SERTRALINE HCL 50 MG PO TABS Oral Take 50 mg by mouth daily.      Marland Kitchen SIMVASTATIN 40 MG PO TABS Oral Take 40 mg by mouth at bedtime.      . TRAMADOL HCL 50 MG PO TABS Oral Take 50 mg by mouth every 6 (six) hours as needed. For pain    . TRAZODONE HCL 50 MG PO TABS Oral Take 50 mg by mouth at bedtime.      Marland Kitchen LORAZEPAM 1 MG PO TABS Oral Take 1 tablet (1 mg total) by mouth 3 (three) times daily as needed for anxiety. 15 tablet 0    BP 176/105  Pulse 72  Temp(Src) 98.5 F (36.9 C) (Oral)  Resp 16  SpO2 100%  LMP 06/03/2011  Physical Exam  Nursing note and vitals reviewed. Constitutional: She appears well-developed and well-nourished. No distress.  HENT:  Head: Normocephalic and atraumatic.  Right Ear: External ear normal.  Left Ear: External ear normal.  Nose: Nose normal.  Mouth/Throat: No oropharyngeal exudate.  Eyes: EOM are normal. Pupils are equal, round, and reactive to light.  Neck: Trachea normal, normal range of motion and full passive range of motion without pain. Neck supple.  Cardiovascular: Normal rate, regular rhythm and normal pulses.     Pulmonary/Chest: Effort normal and breath sounds normal. Chest wall is not dull to percussion. She exhibits no tenderness, no crepitus, no edema, no deformity and no retraction.  Abdominal: Normal appearance. She exhibits no distension. There is no tenderness.  Musculoskeletal: Normal range of motion.  Neurological: She is alert. She has normal strength.  Skin: Skin is warm and intact. She is not diaphoretic.  Psychiatric: She has a normal mood and affect. Her speech is normal. Cognition and memory are normal.    ED Course  Procedures (including critical care time)  Labs Reviewed  POCT I-STAT, CHEM 8 - Abnormal; Notable for the following:    Creatinine, Ser 1.30 (*)    Hemoglobin 11.6 (*)    HCT 34.0 (*)    All other components within normal limits  I-STAT, CHEM 8   No results found.   1. Vertigo       MDM    Discussed plan with pt. Will try Ativan PO Rx since the Antivert is not working. Discussed case with Dr. Effie Shy. Will have pt follow-up with PCP.      Dorthula Matas, PA 06/19/11 [phone]

## 2011-06-18 NOTE — ED Notes (Signed)
AIDET

## 2011-06-18 NOTE — ED Notes (Signed)
PT. REPORTS GENERALIZED WEAKNESS / FATIGUED FOR  4 WEEKS / VOMITTING / DIZZINESS / HEADACHE.

## 2011-06-19 MED ORDER — LORAZEPAM 1 MG PO TABS: 1.0000 mg | ORAL_TABLET | Freq: Three times a day (TID) | ORAL | Status: AC | PRN

## 2011-06-22 NOTE — ED Provider Notes (Signed)
Medical screening examination/treatment/procedure(s) were performed by non-physician practitioner and as supervising physician I was immediately available for consultation/collaboration.  Flint Melter, MD 06/22/11 [phone]

## 2011-06-25 ENCOUNTER — Encounter: Payer: Self-pay | Admitting: Family Medicine

## 2011-06-25 ENCOUNTER — Ambulatory Visit (INDEPENDENT_AMBULATORY_CARE_PROVIDER_SITE_OTHER): Payer: Medicare Other | Admitting: Family Medicine

## 2011-06-25 DIAGNOSIS — I6350 Cerebral infarction due to unspecified occlusion or stenosis of unspecified cerebral artery: Secondary | ICD-10-CM

## 2011-06-25 DIAGNOSIS — I10 Essential (primary) hypertension: Secondary | ICD-10-CM

## 2011-06-25 DIAGNOSIS — R42 Dizziness and giddiness: Secondary | ICD-10-CM

## 2011-06-25 NOTE — Assessment & Plan Note (Signed)
New punctate lesions on last MRI. Prescribed all routine medicines for stroke, needs to see neurologist. New referral for [facility]

## 2011-06-25 NOTE — Patient Instructions (Signed)
We will call you with your appointment to the neurologist. Please come back and see me in 2 weeks and bring all of your medicines with you. You should have a prescription for Zofran at home. This is a medicine that they gave you in the emergency room that helped you with your nausea. Please take the Zofran and see if you feel better.

## 2011-06-25 NOTE — Progress Notes (Signed)
Addended by: Ellery Plunk L on: 06/25/2011 12:15 PM   Modules accepted: Orders

## 2011-06-25 NOTE — Assessment & Plan Note (Signed)
May be related to being new small punctate lesions seen on MRI. Asked her To useZofran since this helped her in the past. She return for followup in 2 weeks. I have asked her to see the neurologist in [place] [facility] and we'll try to set this up.

## 2011-06-25 NOTE — Assessment & Plan Note (Signed)
Hypertensive today without new symptoms. Asked her to please bring all her medicines with her at her next visit. I do not think that she is taking her medicines as prescribed I would not make any changes today. I asked her to try to take all her medicines until her next visit.

## 2011-06-25 NOTE — Progress Notes (Signed)
  Subjective:    Patient ID: Lindsey [name], female    DOB: 1961/04/12, 50 y.o.   MRN: [id number]  HPI Nausea vomiting-patient seen here in October for vertigo. Since that time she states she has been vomiting every day. She says she cannot keep very much down. She went to the emergency room on 1213 for this problem. She had an MRI MRA to evaluate this. She was shown to have some new punctate lesions at that time but no changes were made her care. She has not seen a neurologist for these problems though she's been referred several times. Hypertension-patient states that she is taking all her medications but she cannot name any of them. She says she is hypertensive today because she's not been able to keep her medications down. She has some headache today but she states that this is consistent with her normal intermittent headaches.   Review of Systems Denies chest pain or shortness of breath    Objective:   Physical Exam GEN-well appearing Neuro--patient with normal gait today. Cranial nerves II through XII intact. Patient with 2 beats of nystagmus with leftward gaze. Strength normal in all 4 extremities. Heart - normal rate, regular rhythm, normal S1, S2, no murmurs, rubs, clicks or gallops Chest - clear to auscultation, no wheezes, rales or rhonchi, symmetric air entry, no tachypnea, retractions or cyanosis        Assessment & Plan:

## 2011-07-13 ENCOUNTER — Ambulatory Visit: Payer: Medicare Other | Admitting: Family Medicine

## 2011-10-06 ENCOUNTER — Encounter (HOSPITAL_COMMUNITY): Payer: Self-pay | Admitting: *Deleted

## 2011-10-06 ENCOUNTER — Telehealth: Payer: Self-pay | Admitting: Family Medicine

## 2011-10-06 ENCOUNTER — Emergency Department (HOSPITAL_COMMUNITY)
Admission: EM | Admit: 2011-10-06 | Discharge: 2011-10-06 | Disposition: A | Discharge status: Home / Self Care | Payer: Medicare Other | Attending: Emergency Medicine | Admitting: Emergency Medicine

## 2011-10-06 DIAGNOSIS — R51 Headache: Secondary | ICD-10-CM

## 2011-10-06 DIAGNOSIS — Z8673 Personal history of transient ischemic attack (TIA), and cerebral infarction without residual deficits: Secondary | ICD-10-CM | POA: Insufficient documentation

## 2011-10-06 DIAGNOSIS — I10 Essential (primary) hypertension: Secondary | ICD-10-CM | POA: Insufficient documentation

## 2011-10-06 DIAGNOSIS — M542 Cervicalgia: Secondary | ICD-10-CM | POA: Insufficient documentation

## 2011-10-06 MED ORDER — DIPHENHYDRAMINE HCL 25 MG PO CAPS
25.0000 mg | ORAL_CAPSULE | Freq: Once | ORAL | Status: AC
  Administered 2011-10-06: 25 mg via ORAL
  Filled 2011-10-06: qty 1

## 2011-10-06 MED ORDER — AMLODIPINE BESYLATE 10 MG PO TABS
10.0000 mg | ORAL_TABLET | Freq: Once | ORAL | Status: AC
  Administered 2011-10-06: 10 mg via ORAL
  Filled 2011-10-06: qty 1

## 2011-10-06 MED ORDER — METOCLOPRAMIDE HCL 10 MG PO TABS
10.0000 mg | ORAL_TABLET | Freq: Once | ORAL | Status: AC
  Administered 2011-10-06: 10 mg via ORAL
  Filled 2011-10-06: qty 1

## 2011-10-06 NOTE — ED Provider Notes (Addendum)
History     CSN: [id number]  Arrival date & time 10/06/11  0040   First MD Initiated Contact with Patient 10/06/11 0435      Chief Complaint  Patient presents with  . Migraine    (Consider location/radiation/quality/duration/timing/severity/associated sxs/prior treatment) HPI 51 year old female presents to the emergency department with complaint of 3 weeks of constant headache. Patient is a very poor historian, and is unable to explain why she came in today versus any other day for her ongoing headache. Patient has had light sensitivity and nausea. She reports this headache is similar to her chronic ongoing migraines. Patient reports she is supposed to be seeing a neurologist, but has not made it to the appointment yet. Patient has past medical history of Chiari malformation status post surgery, history of stroke, migraine, hypertension. She reports she is taking all of her medications, but cannot tell me what medications she takes. She is unsure when she last took any of her prescribed medications. Patient reports she's been treating her headache with ibuprofen which has not helped the pain. She denies any new weakness numbness difficulties with speech or coordination. This is not the worst headache of her life. Patient reports she was planning on following up with her primary care doctor this week but decided to come to the ER to get some sleep. Past Medical History  Diagnosis Date  . Hypertension   . Chronic kidney disease   . Stroke   . Neuropathy   . Sleep apnea   . Chiari I malformation   . Migraine   . Hyperlipidemia   . CVA (cerebral infarction)     Past Surgical History  Procedure Date  . Brain surgery   . Breast surgery   . Cesarean section     Family History  Problem Relation Age of Onset  . Hyperlipidemia Mother   . Heart disease Mother   . Hypertension Mother   . Heart disease Sister   . Heart disease Brother     History  Substance Use Topics  . Smoking  status: Never Smoker   . Smokeless tobacco: Not on file  . Alcohol Use: No    OB History    Grav Para Term Preterm Abortions TAB SAB Ect Mult Living                  Review of Systems  Constitutional: Negative.   HENT: Positive for neck pain.   Eyes: Negative.   Respiratory: Negative.   Cardiovascular: Negative.   Gastrointestinal: Negative.   Genitourinary: Negative.   Skin: Negative.   Neurological: Positive for headaches. Negative for dizziness, tremors, seizures, syncope, facial asymmetry, speech difficulty, weakness and numbness.  Hematological: Negative.   Psychiatric/Behavioral: Negative.     Allergies  Iodine  Home Medications   Current Outpatient Rx  Name Route Sig Dispense Refill  . ACETAMINOPHEN 500 MG PO TABS Oral Take 500 mg by mouth every 6 (six) hours as needed. For pain     . ALBUTEROL SULFATE HFA 108 (90 BASE) MCG/ACT IN AERS Inhalation Inhale 2 puffs into the lungs every 4 (four) hours as needed. For wheezing     . AMLODIPINE BESYLATE 10 MG PO TABS Oral Take 10 mg by mouth daily.      . ASPIRIN-DIPYRIDAMOLE ER 25-200 MG PO CP12 Oral Take 1 capsule by mouth 2 (two) times daily.      Marland Kitchen GABAPENTIN 100 MG PO CAPS Oral Take 300 mg by mouth at bedtime.      Marland Kitchen  HYDROCHLOROTHIAZIDE 25 MG PO TABS Oral Take 25 mg by mouth daily.      Marland Kitchen POLYSACCHARIDE IRON COMPLEX 150 MG PO CAPS Oral Take 150 mg by mouth 2 (two) times daily.      Marland Kitchen LISINOPRIL 20 MG PO TABS Oral Take 40 mg by mouth daily.      Marland Kitchen METOCLOPRAMIDE HCL 10 MG PO TABS Oral Take 5 mg by mouth 4 (four) times daily. take one 30 min before meals and at bedtime     . METOPROLOL SUCCINATE ER 50 MG PO TB24 Oral Take 50 mg by mouth every morning.      Marland Kitchen ONDANSETRON HCL 4 MG PO TABS Oral Take 4 mg by mouth every 8 (eight) hours as needed. For nausea/vomiting     . SERTRALINE HCL 50 MG PO TABS Oral Take 50 mg by mouth daily.      Marland Kitchen SIMVASTATIN 40 MG PO TABS Oral Take 40 mg by mouth at bedtime.      . TRAMADOL HCL 50  MG PO TABS Oral Take 50 mg by mouth every 6 (six) hours as needed. For pain    . TRAZODONE HCL 50 MG PO TABS Oral Take 50 mg by mouth at bedtime.        BP 176/87  Pulse 68  Temp(Src) 98.3 F (36.8 C) (Oral)  Resp 16  SpO2 99%  LMP 09/08/2011  Physical Exam  Nursing note and vitals reviewed. Constitutional: She is oriented to person, place, and time. She appears well-developed and well-nourished.  HENT:  Head: Normocephalic and atraumatic.  Right Ear: External ear normal.  Left Ear: External ear normal.  Nose: Nose normal.  Mouth/Throat: Oropharynx is clear and moist.  Eyes: Conjunctivae and EOM are normal. Pupils are equal, round, and reactive to light.  Neck: Normal range of motion. Neck supple. No JVD present. No tracheal deviation present. No thyromegaly present.  Cardiovascular: Normal rate, regular rhythm, normal heart sounds and intact distal pulses.  Exam reveals no gallop and no friction rub.   No murmur heard. Pulmonary/Chest: Effort normal and breath sounds normal. No stridor. No respiratory distress. She has no wheezes. She has no rales. She exhibits no tenderness.  Abdominal: Soft. Bowel sounds are normal. She exhibits no distension and no mass. There is no tenderness. There is no rebound and no guarding.  Musculoskeletal: Normal range of motion. She exhibits no edema and no tenderness.  Lymphadenopathy:    She has no cervical adenopathy.  Neurological: She is alert and oriented to person, place, and time. She has normal reflexes. No cranial nerve deficit. She exhibits normal muscle tone. Coordination normal.       Some tenderness with palpation along the occiput and paraspinal neck which patient relates is the pain that she is been experiencing for the last 3 weeks  Skin: Skin is dry. No rash noted. No erythema. No pallor.  Psychiatric: She has a normal mood and affect. Her behavior is normal. Judgment and thought content normal.    ED Course  Procedures (including  critical care time)  Labs Reviewed - No data to display No results found.   No diagnosis found.    MDM  51 year old female with history of chronic headaches, with ongoing headache the last 3 weeks. This is not the worst headache of her life, not sudden in onset do not feel subarachnoid bleeding is cause for her pain. She has had no fevers and or malaise, and although she has some neck tenderness I  do not feel this is been meningitis. Patient with normal neurologic exam and no complaints consistent with new CVA. Patient is noted to be hypertensive and based on her medication list in the computer, I suspect she is chronically hypertensive. She does not appear to have any end organ damage acutely due to this high blood pressure. Will treat headache, discuss with her primary care Dr. for close followup later this week and patient as before is strongly encouraged to followup with neurology        Olivia Mackie, MD 10/06/11 0650  7:13 AM I've spoken with the on-call family practice resident who will have patient closely followed in the clinic this week. Patient reports she is feeling better, blood pressures are improved. And I reiterated to the patient that she needs to followup with both neurology and then practice.  Olivia Mackie, MD 10/06/11 [phone]

## 2011-10-06 NOTE — ED Notes (Signed)
Pt is here for HA which she has had for 3 weeks.  Pt has had sensitivity to light and sound with this.  Pt has had nauses with this.  Pt has also been having bilateral ankle swelling for the same amount of time.  No sob or CP with this

## 2011-10-06 NOTE — ED Notes (Signed)
Patient ambulated to bathroom and back to room with steady gait.

## 2011-10-06 NOTE — ED Notes (Signed)
MD at bedside. Dr. Otter with patient 

## 2011-10-06 NOTE — Discharge Instructions (Signed)
Please make sure to follow up with your doctor at the [facility] this week to work on getting to see a Neurologist.  Take all your pill bottles with you to the visit.  Return to the ER for worsening headache or new concerning symptoms.  Headaches, Frequently Asked Questions MIGRAINE HEADACHES Q: What is migraine? What causes it? How can I treat it? A: Generally, migraine headaches begin as a dull ache. Then they develop into a constant, throbbing, and pulsating pain. You may experience pain at the temples. You may experience pain at the front or back of one or both sides of the head. The pain is usually accompanied by a combination of:  Nausea.   Vomiting.   Sensitivity to light and noise.  Some people (about 15%) experience an aura (see below) before an attack. The cause of migraine is believed to be chemical reactions in the brain. Treatment for migraine may include over-the-counter or prescription medications. It may also include self-help techniques. These include relaxation training and biofeedback.  Q: What is an aura? A: About 15% of people with migraine get an "aura". This is a sign of neurological symptoms that occur before a migraine headache. You may see wavy or jagged lines, dots, or flashing lights. You might experience tunnel vision or blind spots in one or both eyes. The aura can include visual or auditory hallucinations (something imagined). It may include disruptions in smell (such as strange odors), taste or touch. Other symptoms include:  Numbness.   A "pins and needles" sensation.   Difficulty in recalling or speaking the correct word.  These neurological events may last as long as 60 minutes. These symptoms will fade as the headache begins. Q: What is a trigger? A: Certain physical or environmental factors can lead to or "trigger" a migraine. These include:  Foods.   Hormonal changes.   Weather.   Stress.  It is important to remember that triggers are  different for everyone. To help prevent migraine attacks, you need to figure out which triggers affect you. Keep a headache diary. This is a good way to track triggers. The diary will help you talk to your healthcare professional about your condition. Q: Does weather affect migraines? A: Bright sunshine, hot, humid conditions, and drastic changes in barometric pressure may lead to, or "trigger," a migraine attack in some people. But studies have shown that weather does not act as a trigger for everyone with migraines. Q: What is the link between migraine and hormones? A: Hormones start and regulate many of your body's functions. Hormones keep your body in balance within a constantly changing environment. The levels of hormones in your body are unbalanced at times. Examples are during menstruation, pregnancy, or menopause. That can lead to a migraine attack. In fact, about three quarters of all women with migraine report that their attacks are related to the menstrual cycle.  Q: Is there an increased risk of stroke for migraine sufferers? A: The likelihood of a migraine attack causing a stroke is very remote. That is not to say that migraine sufferers cannot have a stroke associated with their migraines. In persons under age 61, the most common associated factor for stroke is migraine headache. But over the course of a person's normal life span, the occurrence of migraine headache may actually be associated with a reduced risk of dying from cerebrovascular disease due to stroke.  Q: What are acute medications for migraine? A: Acute medications are used to treat the  pain of the headache after it has started. Examples over-the-counter medications, NSAIDs, ergots, and triptans.  Q: What are the triptans? A: Triptans are the newest class of abortive medications. They are specifically targeted to treat migraine. Triptans are vasoconstrictors. They moderate some chemical reactions in the brain. The triptans work  on receptors in your brain. Triptans help to restore the balance of a neurotransmitter called serotonin. Fluctuations in levels of serotonin are thought to be a main cause of migraine.  Q: Are over-the-counter medications for migraine effective? A: Over-the-counter, or "OTC," medications may be effective in relieving mild to moderate pain and associated symptoms of migraine. But you should see your caregiver before beginning any treatment regimen for migraine.  Q: What are preventive medications for migraine? A: Preventive medications for migraine are sometimes referred to as "prophylactic" treatments. They are used to reduce the frequency, severity, and length of migraine attacks. Examples of preventive medications include antiepileptic medications, antidepressants, beta-blockers, calcium channel blockers, and NSAIDs (nonsteroidal anti-inflammatory drugs). Q: Why are anticonvulsants used to treat migraine? A: During the past few years, there has been an increased interest in antiepileptic drugs for the prevention of migraine. They are sometimes referred to as "anticonvulsants". Both epilepsy and migraine may be caused by similar reactions in the brain.  Q: Why are antidepressants used to treat migraine? A: Antidepressants are typically used to treat people with depression. They may reduce migraine frequency by regulating chemical levels, such as serotonin, in the brain.  Q: What alternative therapies are used to treat migraine? A: The term "alternative therapies" is often used to describe treatments considered outside the scope of conventional Western medicine. Examples of alternative therapy include acupuncture, acupressure, and yoga. Another common alternative treatment is herbal therapy. Some herbs are believed to relieve headache pain. Always discuss alternative therapies with your caregiver before proceeding. Some herbal products contain arsenic and other toxins. TENSION HEADACHES Q: What is a  tension-type headache? What causes it? How can I treat it? A: Tension-type headaches occur randomly. They are often the result of temporary stress, anxiety, fatigue, or anger. Symptoms include soreness in your temples, a tightening band-like sensation around your head (a "vice-like" ache). Symptoms can also include a pulling feeling, pressure sensations, and contracting head and neck muscles. The headache begins in your forehead, temples, or the back of your head and neck. Treatment for tension-type headache may include over-the-counter or prescription medications. Treatment may also include self-help techniques such as relaxation training and biofeedback. CLUSTER HEADACHES Q: What is a cluster headache? What causes it? How can I treat it? A: Cluster headache gets its name because the attacks come in groups. The pain arrives with little, if any, warning. It is usually on one side of the head. A tearing or bloodshot eye and a runny nose on the same side of the headache may also accompany the pain. Cluster headaches are believed to be caused by chemical reactions in the brain. They have been described as the most severe and intense of any headache type. Treatment for cluster headache includes prescription medication and oxygen. SINUS HEADACHES Q: What is a sinus headache? What causes it? How can I treat it? A: When a cavity in the bones of the face and skull (a sinus) becomes inflamed, the inflammation will cause localized pain. This condition is usually the result of an allergic reaction, a tumor, or an infection. If your headache is caused by a sinus blockage, such as an infection, you will probably have a fever.  An x-ray will confirm a sinus blockage. Your caregiver's treatment might include antibiotics for the infection, as well as antihistamines or decongestants.  REBOUND HEADACHES Q: What is a rebound headache? What causes it? How can I treat it? A: A pattern of taking acute headache medications too  often can lead to a condition known as "rebound headache." A pattern of taking too much headache medication includes taking it more than 2 days per week or in excessive amounts. That means more than the label or a caregiver advises. With rebound headaches, your medications not only stop relieving pain, they actually begin to cause headaches. Doctors treat rebound headache by tapering the medication that is being overused. Sometimes your caregiver will gradually substitute a different type of treatment or medication. Stopping may be a challenge. Regularly overusing a medication increases the potential for serious side effects. Consult a caregiver if you regularly use headache medications more than 2 days per week or more than the label advises. ADDITIONAL QUESTIONS AND ANSWERS Q: What is biofeedback? A: Biofeedback is a self-help treatment. Biofeedback uses special equipment to monitor your body's involuntary physical responses. Biofeedback monitors:  Breathing.   Pulse.   Heart rate.   Temperature.   Muscle tension.   Brain activity.  Biofeedback helps you refine and perfect your relaxation exercises. You learn to control the physical responses that are related to stress. Once the technique has been mastered, you do not need the equipment any more. Q: Are headaches hereditary? A: Four out of five (80%) of people that suffer report a family history of migraine. Scientists are not sure if this is genetic or a family predisposition. Despite the uncertainty, a child has a 50% chance of having migraine if one parent suffers. The child has a 75% chance if both parents suffer.  Q: Can children get headaches? A: By the time they reach high school, most young people have experienced some type of headache. Many safe and effective approaches or medications can prevent a headache from occurring or stop it after it has begun.  Q: What type of doctor should I see to diagnose and treat my headache? A: Start  with your primary caregiver. Discuss his or her experience and approach to headaches. Discuss methods of classification, diagnosis, and treatment. Your caregiver may decide to recommend you to a headache specialist, depending upon your symptoms or other physical conditions. Having diabetes, allergies, etc., may require a more comprehensive and inclusive approach to your headache. The National Head[employer] Foundation will provide, upon request, a list of [facility] physician members in your state. Document Released: 09/12/2003 Document Revised: 06/11/2011 Document Reviewed: 02/20/2008 [facility] Patient Information 2012 [place], [place].

## 2011-10-06 NOTE — Telephone Encounter (Signed)
Attempted to call patient no answer

## 2011-10-06 NOTE — Telephone Encounter (Signed)
ER called this morning requesting follow- up ER visit this week if possible.  Please call patient and schedule appointment with PCP.  Will route to [employer].

## 2011-10-28 ENCOUNTER — Emergency Department (HOSPITAL_COMMUNITY)
Admission: EM | Admit: 2011-10-28 | Discharge: 2011-10-28 | Disposition: A | Discharge status: Home / Self Care | Payer: Medicare Other | Attending: Emergency Medicine | Admitting: Emergency Medicine

## 2011-10-28 ENCOUNTER — Encounter (HOSPITAL_COMMUNITY): Payer: Self-pay | Admitting: *Deleted

## 2011-10-28 DIAGNOSIS — E785 Hyperlipidemia, unspecified: Secondary | ICD-10-CM | POA: Insufficient documentation

## 2011-10-28 DIAGNOSIS — Z79899 Other long term (current) drug therapy: Secondary | ICD-10-CM | POA: Insufficient documentation

## 2011-10-28 DIAGNOSIS — I129 Hypertensive chronic kidney disease with stage 1 through stage 4 chronic kidney disease, or unspecified chronic kidney disease: Secondary | ICD-10-CM | POA: Insufficient documentation

## 2011-10-28 DIAGNOSIS — M25512 Pain in left shoulder: Secondary | ICD-10-CM

## 2011-10-28 DIAGNOSIS — M25519 Pain in unspecified shoulder: Secondary | ICD-10-CM | POA: Insufficient documentation

## 2011-10-28 DIAGNOSIS — R42 Dizziness and giddiness: Secondary | ICD-10-CM | POA: Insufficient documentation

## 2011-10-28 DIAGNOSIS — N189 Chronic kidney disease, unspecified: Secondary | ICD-10-CM | POA: Insufficient documentation

## 2011-10-28 DIAGNOSIS — Z8673 Personal history of transient ischemic attack (TIA), and cerebral infarction without residual deficits: Secondary | ICD-10-CM | POA: Insufficient documentation

## 2011-10-28 DIAGNOSIS — R296 Repeated falls: Secondary | ICD-10-CM | POA: Insufficient documentation

## 2011-10-28 MED ORDER — LISINOPRIL 20 MG PO TABS
20.0000 mg | ORAL_TABLET | ORAL | Status: AC
  Administered 2011-10-28: 20 mg via ORAL
  Filled 2011-10-28: qty 1

## 2011-10-28 MED ORDER — ACETAMINOPHEN 325 MG PO TABS
650.0000 mg | ORAL_TABLET | Freq: Once | ORAL | Status: AC
  Administered 2011-10-28: 650 mg via ORAL
  Filled 2011-10-28: qty 2

## 2011-10-28 MED ORDER — MECLIZINE HCL 50 MG PO TABS: 50.0000 mg | ORAL_TABLET | Freq: Three times a day (TID) | ORAL | Status: AC | PRN

## 2011-10-28 MED ORDER — MECLIZINE HCL 25 MG PO TABS
25.0000 mg | ORAL_TABLET | Freq: Once | ORAL | Status: AC
  Administered 2011-10-28: 25 mg via ORAL
  Filled 2011-10-28: qty 1

## 2011-10-28 NOTE — ED Provider Notes (Signed)
History     CSN: [id number]  Arrival date & time 10/28/11  1315   First MD Initiated Contact with Patient 10/28/11 1432      Chief Complaint  Patient presents with  . Fall  . Dizziness    (Consider location/radiation/quality/duration/timing/severity/associated sxs/prior treatment) Patient is a 51 y.o. female presenting with fall. The history is provided by the patient and medical records.  Fall Pertinent negatives include no fever, no numbness, no abdominal pain, no nausea, no vomiting and no headaches.   the patient is a 51 year old, female, with a history of hypertension, migraine headaches, and vertigo, who presents to emergency department complaining of dizziness and left shoulder pain after she fell.  She says she was walking.  She became dizzy.  The room was spinning and she fell down onto her left shoulder.  She denied pain.  Prior to falling.  She denied nausea, vomiting, vision changes.  Prior to falling.  She did not hit her head.  She denies a headache, or neck pain.  She has no pain except in her left shoulder.  She says her dizziness is improved.  Past Medical History  Diagnosis Date  . Hypertension   . Chronic kidney disease   . Stroke   . Neuropathy   . Sleep apnea   . Chiari I malformation   . Migraine   . Hyperlipidemia   . CVA (cerebral infarction)     Past Surgical History  Procedure Date  . Brain surgery   . Breast surgery   . Cesarean section     Family History  Problem Relation Age of Onset  . Hyperlipidemia Mother   . Heart disease Mother   . Hypertension Mother   . Heart disease Sister   . Heart disease Brother     History  Substance Use Topics  . Smoking status: Never Smoker   . Smokeless tobacco: Not on file  . Alcohol Use: No    OB History    Grav Para Term Preterm Abortions TAB SAB Ect Mult Living                  Review of Systems  Constitutional: Negative for fever and chills.  HENT: Negative for nosebleeds and neck pain.    Eyes: Negative for visual disturbance.  Respiratory: Negative for cough and chest tightness.   Cardiovascular: Negative for chest pain and palpitations.  Gastrointestinal: Negative for nausea, vomiting and abdominal pain.  Genitourinary: Negative for dysuria.  Musculoskeletal: Negative for back pain.  Skin: Negative for wound.  Neurological: Positive for dizziness. Negative for weakness, numbness and headaches.  Psychiatric/Behavioral: Negative for confusion.  All other systems reviewed and are negative.    Allergies  Iodine  Home Medications   Current Outpatient Rx  Name Route Sig Dispense Refill  . ACETAMINOPHEN 500 MG PO TABS Oral Take 500 mg by mouth every 6 (six) hours as needed. For pain     . ALBUTEROL SULFATE HFA 108 (90 BASE) MCG/ACT IN AERS Inhalation Inhale 2 puffs into the lungs every 4 (four) hours as needed. For wheezing     . AMLODIPINE BESYLATE 10 MG PO TABS Oral Take 10 mg by mouth daily.      . ASPIRIN-DIPYRIDAMOLE ER 25-200 MG PO CP12 Oral Take 1 capsule by mouth 2 (two) times daily.      Marland Kitchen GABAPENTIN 100 MG PO CAPS Oral Take 300 mg by mouth at bedtime.      Marland Kitchen HYDROCHLOROTHIAZIDE 25 MG PO TABS  Oral Take 25 mg by mouth daily.      Marland Kitchen POLYSACCHARIDE IRON COMPLEX 150 MG PO CAPS Oral Take 150 mg by mouth 2 (two) times daily.      Marland Kitchen LISINOPRIL 20 MG PO TABS Oral Take 40 mg by mouth daily.      Marland Kitchen METOCLOPRAMIDE HCL 10 MG PO TABS Oral Take 5 mg by mouth 4 (four) times daily. take one 30 min before meals and at bedtime     . METOPROLOL SUCCINATE ER 50 MG PO TB24 Oral Take 50 mg by mouth every morning.      Marland Kitchen ONDANSETRON HCL 4 MG PO TABS Oral Take 4 mg by mouth every 8 (eight) hours as needed. For nausea/vomiting     . SERTRALINE HCL 50 MG PO TABS Oral Take 50 mg by mouth daily.      Marland Kitchen SIMVASTATIN 40 MG PO TABS Oral Take 40 mg by mouth at bedtime.      . TRAMADOL HCL 50 MG PO TABS Oral Take 50 mg by mouth every 6 (six) hours as needed. For pain    . TRAZODONE HCL 50 MG PO  TABS Oral Take 50 mg by mouth at bedtime.        BP 174/99  Pulse 68  Temp(Src) 97.7 F (36.5 C) (Oral)  Resp 18  SpO2 100%  LMP 09/08/2011  Physical Exam  Vitals reviewed. Constitutional: She is oriented to person, place, and time. She appears well-developed and well-nourished. No distress.  HENT:  Head: Normocephalic and atraumatic.  Right Ear: External ear normal.  Left Ear: External ear normal.  Eyes: Conjunctivae and EOM are normal.  Neck: Normal range of motion. Neck supple.       No carotid bruits  Cardiovascular: Normal rate.   No murmur heard. Pulmonary/Chest: Effort normal and breath sounds normal. No respiratory distress.  Abdominal: Soft. Bowel sounds are normal. She exhibits no distension. There is no tenderness.  Musculoskeletal: Normal range of motion. She exhibits no edema and no tenderness.  Neurological: She is alert and oriented to person, place, and time. She has normal strength. She is not disoriented. She displays no tremor. No cranial nerve deficit. Coordination normal.       No nystagmus  Skin: Skin is warm and dry.  Psychiatric: She has a normal mood and affect. Thought content normal.    ED Course  Procedures (including critical care time) Vertigo, with normal.  Neurological examination.  Normal.  Mentation no signs of systemic illness.  We will give meclizine.  There is no indication for doing a CAT scan or laboratory testing.  Oral pain after fall without evidence of fracture or dislocation.  No x-ray is indicated.  Labs Reviewed - No data to display No results found.   No diagnosis found.  ED ECG REPORT    ECG ECG time.  1335 Normal sinus rhythm with rate 72 Normal axis Normal.  Interval nonspecific T wave changes Impression normal sinus rhythm with nonspecific T-wave change          MDM  Vertigo Normal.  Neurological examination, and mental status. Left shoulder pain without fracture or dislocation        Cheri Guppy, MD 10/28/11 1517

## 2011-10-28 NOTE — ED Notes (Signed)
Patient reports she became dizzy and fell 1 hour ago.  Patient denies pain.  Patient denies hitting her head.  She complains of left shoulder pain

## 2011-10-28 NOTE — Discharge Instructions (Signed)
Use meclizine every 8 hours for persistent dizziness.  Use Tylenol or Motrin for pain.  Followup with your Dr. if your symptoms.  Last more than 2-3 days.  Return for worse or uncontrolled symptoms

## 2011-10-28 NOTE — ED Notes (Signed)
Dr. Manus Gunning notified of htn. Lisinopril ordered

## 2012-05-18 ENCOUNTER — Inpatient Hospital Stay (HOSPITAL_COMMUNITY): Payer: Medicare Other

## 2012-05-18 ENCOUNTER — Inpatient Hospital Stay (HOSPITAL_COMMUNITY)
Admission: EM | Admit: 2012-05-18 | Discharge: 2012-05-20 | DRG: 064 | Disposition: A | Discharge status: Rehabilitation Facility | Payer: Medicare Other | Attending: Family Medicine | Admitting: Family Medicine

## 2012-05-18 ENCOUNTER — Emergency Department (HOSPITAL_COMMUNITY): Payer: Medicare Other

## 2012-05-18 ENCOUNTER — Encounter (HOSPITAL_COMMUNITY): Payer: Self-pay | Admitting: Emergency Medicine

## 2012-05-18 DIAGNOSIS — R0989 Other specified symptoms and signs involving the circulatory and respiratory systems: Secondary | ICD-10-CM | POA: Diagnosis present

## 2012-05-18 DIAGNOSIS — G589 Mononeuropathy, unspecified: Secondary | ICD-10-CM | POA: Diagnosis present

## 2012-05-18 DIAGNOSIS — G935 Compression of brain: Secondary | ICD-10-CM | POA: Diagnosis present

## 2012-05-18 DIAGNOSIS — Z9119 Patient's noncompliance with other medical treatment and regimen: Secondary | ICD-10-CM

## 2012-05-18 DIAGNOSIS — Z6837 Body mass index (BMI) 37.0-37.9, adult: Secondary | ICD-10-CM

## 2012-05-18 DIAGNOSIS — Z888 Allergy status to other drugs, medicaments and biological substances status: Secondary | ICD-10-CM

## 2012-05-18 DIAGNOSIS — M545 Low back pain, unspecified: Secondary | ICD-10-CM | POA: Diagnosis present

## 2012-05-18 DIAGNOSIS — I639 Cerebral infarction, unspecified: Secondary | ICD-10-CM

## 2012-05-18 DIAGNOSIS — Z9181 History of falling: Secondary | ICD-10-CM

## 2012-05-18 DIAGNOSIS — R0609 Other forms of dyspnea: Secondary | ICD-10-CM | POA: Diagnosis present

## 2012-05-18 DIAGNOSIS — G4730 Sleep apnea, unspecified: Secondary | ICD-10-CM | POA: Diagnosis present

## 2012-05-18 DIAGNOSIS — Z91199 Patient's noncompliance with other medical treatment and regimen due to unspecified reason: Secondary | ICD-10-CM

## 2012-05-18 DIAGNOSIS — N189 Chronic kidney disease, unspecified: Secondary | ICD-10-CM | POA: Diagnosis present

## 2012-05-18 DIAGNOSIS — F3289 Other specified depressive episodes: Secondary | ICD-10-CM | POA: Diagnosis present

## 2012-05-18 DIAGNOSIS — N179 Acute kidney failure, unspecified: Secondary | ICD-10-CM | POA: Diagnosis not present

## 2012-05-18 DIAGNOSIS — D72829 Elevated white blood cell count, unspecified: Secondary | ICD-10-CM | POA: Diagnosis present

## 2012-05-18 DIAGNOSIS — F329 Major depressive disorder, single episode, unspecified: Secondary | ICD-10-CM | POA: Diagnosis present

## 2012-05-18 DIAGNOSIS — Z7982 Long term (current) use of aspirin: Secondary | ICD-10-CM

## 2012-05-18 DIAGNOSIS — M25569 Pain in unspecified knee: Secondary | ICD-10-CM | POA: Diagnosis present

## 2012-05-18 DIAGNOSIS — R42 Dizziness and giddiness: Secondary | ICD-10-CM

## 2012-05-18 DIAGNOSIS — R0789 Other chest pain: Secondary | ICD-10-CM | POA: Diagnosis present

## 2012-05-18 DIAGNOSIS — E876 Hypokalemia: Secondary | ICD-10-CM | POA: Diagnosis not present

## 2012-05-18 DIAGNOSIS — E785 Hyperlipidemia, unspecified: Secondary | ICD-10-CM | POA: Diagnosis present

## 2012-05-18 DIAGNOSIS — K589 Irritable bowel syndrome without diarrhea: Secondary | ICD-10-CM | POA: Diagnosis present

## 2012-05-18 DIAGNOSIS — R531 Weakness: Secondary | ICD-10-CM

## 2012-05-18 DIAGNOSIS — G43909 Migraine, unspecified, not intractable, without status migrainosus: Secondary | ICD-10-CM | POA: Diagnosis present

## 2012-05-18 DIAGNOSIS — IMO0002 Reserved for concepts with insufficient information to code with codable children: Secondary | ICD-10-CM | POA: Diagnosis present

## 2012-05-18 DIAGNOSIS — I6350 Cerebral infarction due to unspecified occlusion or stenosis of unspecified cerebral artery: Principal | ICD-10-CM | POA: Diagnosis present

## 2012-05-18 DIAGNOSIS — G8929 Other chronic pain: Secondary | ICD-10-CM | POA: Diagnosis present

## 2012-05-18 DIAGNOSIS — M79609 Pain in unspecified limb: Secondary | ICD-10-CM | POA: Diagnosis present

## 2012-05-18 DIAGNOSIS — I619 Nontraumatic intracerebral hemorrhage, unspecified: Secondary | ICD-10-CM | POA: Diagnosis present

## 2012-05-18 DIAGNOSIS — K319 Disease of stomach and duodenum, unspecified: Secondary | ICD-10-CM | POA: Diagnosis present

## 2012-05-18 DIAGNOSIS — Z79899 Other long term (current) drug therapy: Secondary | ICD-10-CM

## 2012-05-18 DIAGNOSIS — G8190 Hemiplegia, unspecified affecting unspecified side: Secondary | ICD-10-CM | POA: Diagnosis present

## 2012-05-18 DIAGNOSIS — G4700 Insomnia, unspecified: Secondary | ICD-10-CM | POA: Diagnosis present

## 2012-05-18 DIAGNOSIS — I129 Hypertensive chronic kidney disease with stage 1 through stage 4 chronic kidney disease, or unspecified chronic kidney disease: Secondary | ICD-10-CM | POA: Diagnosis present

## 2012-05-18 DIAGNOSIS — I10 Essential (primary) hypertension: Secondary | ICD-10-CM | POA: Diagnosis present

## 2012-05-18 DIAGNOSIS — Z91041 Radiographic dye allergy status: Secondary | ICD-10-CM

## 2012-05-18 DIAGNOSIS — Z8673 Personal history of transient ischemic attack (TIA), and cerebral infarction without residual deficits: Secondary | ICD-10-CM

## 2012-05-18 DIAGNOSIS — R079 Chest pain, unspecified: Secondary | ICD-10-CM

## 2012-05-18 HISTORY — DX: Gastro-esophageal reflux disease without esophagitis: K21.9

## 2012-05-18 HISTORY — DX: Headache, unspecified: R51.9

## 2012-05-18 HISTORY — DX: Headache: R51

## 2012-05-18 LAB — BASIC METABOLIC PANEL
CO2: 23 mEq/L (ref 19–32)
Calcium: 9.8 mg/dL (ref 8.4–10.5)
Chloride: 100 mEq/L (ref 96–112)
Creatinine, Ser: 1.01 mg/dL (ref 0.50–1.10)
Glucose, Bld: 100 mg/dL — ABNORMAL HIGH (ref 70–99)

## 2012-05-18 LAB — URINALYSIS, ROUTINE W REFLEX MICROSCOPIC
Glucose, UA: NEGATIVE mg/dL
Ketones, ur: 15 mg/dL — AB
Protein, ur: >300 mg/dL — AB
pH: 5.5 (ref 5.0–8.0)

## 2012-05-18 LAB — CBC WITH DIFFERENTIAL/PLATELET
Eosinophils Relative: 0 % (ref 0–5)
HCT: 36 % (ref 36.0–46.0)
Hemoglobin: 12.6 g/dL (ref 12.0–15.0)
Lymphocytes Relative: 30 % (ref 12–46)
Lymphs Abs: 2.7 10*3/uL (ref 0.7–4.0)
MCV: 79.5 fL (ref 78.0–100.0)
Monocytes Absolute: 0.5 10*3/uL (ref 0.1–1.0)
Monocytes Relative: 5 % (ref 3–12)
RBC: 4.53 MIL/uL (ref 3.87–5.11)
WBC: 9.1 10*3/uL (ref 4.0–10.5)

## 2012-05-18 LAB — GLUCOSE, CAPILLARY: Glucose-Capillary: 93 mg/dL (ref 70–99)

## 2012-05-18 LAB — URINE MICROSCOPIC-ADD ON

## 2012-05-18 LAB — CBC
MCH: 27.9 pg (ref 26.0–34.0)
MCV: 79 fL (ref 78.0–100.0)
Platelets: 297 10*3/uL (ref 150–400)
RDW: 15.8 % — ABNORMAL HIGH (ref 11.5–15.5)

## 2012-05-18 LAB — POCT I-STAT TROPONIN I: Troponin i, poc: 0 ng/mL (ref 0.00–0.08)

## 2012-05-18 MED ORDER — TRAZODONE HCL 50 MG PO TABS
50.0000 mg | ORAL_TABLET | Freq: Every day | ORAL | Status: DC
  Administered 2012-05-18 – 2012-05-19 (×2): 50 mg via ORAL
  Filled 2012-05-18 (×4): qty 1

## 2012-05-18 MED ORDER — METOPROLOL SUCCINATE ER 50 MG PO TB24
50.0000 mg | ORAL_TABLET | ORAL | Status: DC
  Administered 2012-05-19 – 2012-05-20 (×2): 50 mg via ORAL
  Filled 2012-05-18 (×4): qty 1

## 2012-05-18 MED ORDER — ACETAMINOPHEN 325 MG PO TABS: 650.0000 mg | ORAL_TABLET | Freq: Four times a day (QID) | ORAL | Status: DC | PRN

## 2012-05-18 MED ORDER — LISINOPRIL 40 MG PO TABS
40.0000 mg | ORAL_TABLET | Freq: Every day | ORAL | Status: DC
  Administered 2012-05-19: 40 mg via ORAL
  Filled 2012-05-18 (×2): qty 1

## 2012-05-18 MED ORDER — SODIUM CHLORIDE 0.9 % IJ SOLN
3.0000 mL | Freq: Two times a day (BID) | INTRAMUSCULAR | Status: DC
  Administered 2012-05-18 – 2012-05-20 (×4): 3 mL via INTRAVENOUS

## 2012-05-18 MED ORDER — GABAPENTIN 300 MG PO CAPS
300.0000 mg | ORAL_CAPSULE | Freq: Every day | ORAL | Status: DC
  Administered 2012-05-18 – 2012-05-19 (×2): 300 mg via ORAL
  Filled 2012-05-18 (×4): qty 1

## 2012-05-18 MED ORDER — TRAMADOL HCL 50 MG PO TABS
50.0000 mg | ORAL_TABLET | Freq: Four times a day (QID) | ORAL | Status: DC | PRN
  Administered 2012-05-18: 50 mg via ORAL
  Filled 2012-05-18: qty 1

## 2012-05-18 MED ORDER — HEPARIN SODIUM (PORCINE) 5000 UNIT/ML IJ SOLN
5000.0000 [IU] | Freq: Three times a day (TID) | INTRAMUSCULAR | Status: DC
  Administered 2012-05-18 – 2012-05-20 (×6): 5000 [IU] via SUBCUTANEOUS
  Filled 2012-05-18 (×10): qty 1

## 2012-05-18 MED ORDER — POLYSACCHARIDE IRON COMPLEX 150 MG PO CAPS
150.0000 mg | ORAL_CAPSULE | Freq: Two times a day (BID) | ORAL | Status: DC
  Administered 2012-05-18 – 2012-05-20 (×4): 150 mg via ORAL
  Filled 2012-05-18 (×7): qty 1

## 2012-05-18 MED ORDER — ACETAMINOPHEN 650 MG RE SUPP: 650.0000 mg | Freq: Four times a day (QID) | RECTAL | Status: DC | PRN

## 2012-05-18 MED ORDER — ASPIRIN-DIPYRIDAMOLE ER 25-200 MG PO CP12
1.0000 | ORAL_CAPSULE | Freq: Two times a day (BID) | ORAL | Status: DC
  Administered 2012-05-18 – 2012-05-20 (×4): 1 via ORAL
  Filled 2012-05-18 (×6): qty 1

## 2012-05-18 MED ORDER — HYDROCHLOROTHIAZIDE 25 MG PO TABS
25.0000 mg | ORAL_TABLET | Freq: Every day | ORAL | Status: DC
  Administered 2012-05-19 – 2012-05-20 (×2): 25 mg via ORAL
  Filled 2012-05-18 (×2): qty 1

## 2012-05-18 MED ORDER — ALBUTEROL SULFATE HFA 108 (90 BASE) MCG/ACT IN AERS: 2.0000 | INHALATION_SPRAY | Freq: Four times a day (QID) | RESPIRATORY_TRACT | Status: DC | PRN

## 2012-05-18 MED ORDER — METOCLOPRAMIDE HCL 5 MG PO TABS
5.0000 mg | ORAL_TABLET | Freq: Four times a day (QID) | ORAL | Status: DC
  Administered 2012-05-18 – 2012-05-20 (×6): 5 mg via ORAL
  Filled 2012-05-18 (×11): qty 1

## 2012-05-18 MED ORDER — LABETALOL HCL 5 MG/ML IV SOLN
2.5000 mg | INTRAVENOUS | Status: DC | PRN
  Administered 2012-05-19: 0.5 mL via INTRAVENOUS
  Filled 2012-05-18: qty 4

## 2012-05-18 MED ORDER — AMLODIPINE BESYLATE 10 MG PO TABS
10.0000 mg | ORAL_TABLET | Freq: Every day | ORAL | Status: DC
  Administered 2012-05-19 – 2012-05-20 (×2): 10 mg via ORAL
  Filled 2012-05-18 (×3): qty 1

## 2012-05-18 MED ORDER — SIMVASTATIN 40 MG PO TABS
40.0000 mg | ORAL_TABLET | Freq: Every day | ORAL | Status: DC
  Administered 2012-05-18 – 2012-05-19 (×2): 40 mg via ORAL
  Filled 2012-05-18 (×6): qty 1

## 2012-05-18 MED ORDER — SERTRALINE HCL 50 MG PO TABS
50.0000 mg | ORAL_TABLET | Freq: Every day | ORAL | Status: DC
  Administered 2012-05-19 – 2012-05-20 (×2): 50 mg via ORAL
  Filled 2012-05-18 (×2): qty 1

## 2012-05-18 MED ORDER — ASPIRIN EC 81 MG PO TBEC
81.0000 mg | DELAYED_RELEASE_TABLET | Freq: Every day | ORAL | Status: DC
  Administered 2012-05-19 – 2012-05-20 (×2): 81 mg via ORAL
  Filled 2012-05-18 (×3): qty 1

## 2012-05-18 NOTE — ED Provider Notes (Addendum)
History     CSN: [id number] Arrival date & time 05/18/12  1125 First MD Initiated Contact with Patient 05/18/12 1227      Chief Complaint  Patient presents with  . Weakness  . Fall    HPI Pt gradually noticed weakness on the left side of her body last evening.  She felt it in the arm and leg.  No trouble with speech.    Pt actually fell last evening because her leg gave out.  She had trouble walking  This morning because she still feels weak.  No complaints of pain.  No fever,.  No numbness.  History of prior stroke but no trouble with any residual symptoms. Past Medical History  Diagnosis Date  . Hypertension   . Chronic kidney disease   . Stroke   . Neuropathy   . Sleep apnea   . Chiari I malformation   . Migraine   . Hyperlipidemia   . CVA (cerebral infarction)     Past Surgical History  Procedure Date  . Brain surgery   . Breast surgery   . Cesarean section     Family History  Problem Relation Age of Onset  . Hyperlipidemia Mother   . Heart disease Mother   . Hypertension Mother   . Heart disease Sister   . Heart disease Brother     History  Substance Use Topics  . Smoking status: Never Smoker   . Smokeless tobacco: Not on file  . Alcohol Use: No    OB History    Grav Para Term Preterm Abortions TAB SAB Ect Mult Living                  Review of Systems  All other systems reviewed and are negative.    Allergies  Iodine  Home Medications   Current Outpatient Rx  Name  Route  Sig  Dispense  Refill  . ACETAMINOPHEN 500 MG PO TABS   Oral   Take 500 mg by mouth every 6 (six) hours as needed. For pain          . ALBUTEROL SULFATE HFA 108 (90 BASE) MCG/ACT IN AERS   Inhalation   Inhale 2 puffs into the lungs every 4 (four) hours as needed. For wheezing          . AMLODIPINE BESYLATE 10 MG PO TABS   Oral   Take 10 mg by mouth daily.           . ASPIRIN-DIPYRIDAMOLE ER 25-200 MG PO CP12   Oral   Take 1 capsule by mouth 2 (two)  times daily.           Marland Kitchen GABAPENTIN 100 MG PO CAPS   Oral   Take 300 mg by mouth at bedtime.           Marland Kitchen HYDROCHLOROTHIAZIDE 25 MG PO TABS   Oral   Take 25 mg by mouth daily.           Marland Kitchen POLYSACCHARIDE IRON COMPLEX 150 MG PO CAPS   Oral   Take 150 mg by mouth 2 (two) times daily.           Marland Kitchen LISINOPRIL 20 MG PO TABS   Oral   Take 40 mg by mouth daily.           Marland Kitchen METOCLOPRAMIDE HCL 10 MG PO TABS   Oral   Take 5 mg by mouth 4 (four) times daily. take one 30 min before  meals and at bedtime          . METOPROLOL SUCCINATE ER 50 MG PO TB24   Oral   Take 50 mg by mouth every morning.           Marland Kitchen SERTRALINE HCL 50 MG PO TABS   Oral   Take 50 mg by mouth daily.           Marland Kitchen SIMVASTATIN 40 MG PO TABS   Oral   Take 40 mg by mouth at bedtime.           . TRAMADOL HCL 50 MG PO TABS   Oral   Take 50 mg by mouth every 6 (six) hours as needed. For pain         . TRAZODONE HCL 50 MG PO TABS   Oral   Take 50 mg by mouth at bedtime.             BP 172/123  Pulse 91  Temp 97.7 F (36.5 C) (Axillary)  Resp 18  SpO2 97%  Physical Exam  Nursing note and vitals reviewed. Constitutional: She is oriented to person, place, and time. She appears well-developed and well-nourished. No distress.  HENT:  Head: Normocephalic and atraumatic.  Right Ear: External ear normal.  Left Ear: External ear normal.  Mouth/Throat: Oropharynx is clear and moist.  Eyes: Conjunctivae normal are normal. Right eye exhibits no discharge. Left eye exhibits no discharge. No scleral icterus.  Neck: Neck supple. No tracheal deviation present.  Cardiovascular: Normal rate, regular rhythm and intact distal pulses.   Pulmonary/Chest: Effort normal and breath sounds normal. No stridor. No respiratory distress. She has no wheezes. She has no rales.  Abdominal: Soft. Bowel sounds are normal. She exhibits no distension. There is no tenderness. There is no rebound and no guarding.    Musculoskeletal: She exhibits no edema and no tenderness.  Neurological: She is alert and oriented to person, place, and time. No cranial nerve deficit ( no gross defecits noted) or sensory deficit. She exhibits normal muscle tone. She displays no seizure activity. Coordination normal.        pronator drift left upper extrem,un able to hold left leg off bed for 5 seconds, sensation intact in all extremities, no visual field cuts, no left or right sided neglect  Skin: Skin is warm and dry. No rash noted.  Psychiatric: She has a normal mood and affect.    ED Course  Procedures (including critical care time)  Labs Reviewed  CBC WITH DIFFERENTIAL - Abnormal; Notable for the following:    RDW 16.1 (*)     All other components within normal limits  BASIC METABOLIC PANEL - Abnormal; Notable for the following:    Glucose, Bld 100 (*)     GFR calc non Af Amer 64 (*)     GFR calc Af Amer 74 (*)     All other components within normal limits  GLUCOSE, CAPILLARY  POCT I-STAT TROPONIN I  URINALYSIS, ROUTINE W REFLEX MICROSCOPIC   Ct Head Wo Contrast  05/18/2012  *RADIOLOGY REPORT*  Clinical Data: Weakness on the left.  Fall.  CT HEAD WITHOUT CONTRAST  Technique:  Contiguous axial images were obtained from the base of the skull through the vertex without contrast.  Comparison: MRI 04/27/2011.  CT scan 04/27/2011.  Findings: There has been previous suboccipital craniectomy.  There is an old infarction in the inferior cerebellum on the left.  No apparent brain stem insult.  There is  an old lacunar infarction within the right thalamus.  There are extensive infarctions throughout both basal ganglia regions.  There are mild chronic small vessel changes  elsewhere within the hemispheric white matter.  No sign of acute infarction, mass lesion, hemorrhage, hydrocephalus or extra-axial collection.  There is fluid in the left maxillary sinus.  IMPRESSION: No acute finding.  Extensive old infarctions throughout the  brain as outlined above.  Previous suboccipital craniectomy.  Fluid in the left maxillary sinus.   Original Report Authenticated By: Paulina Fusi, M.D.      MDM  Patient has extensive old infarctions noted on CT scan. Her symptoms are concerning for recurrent CVA today. I will consult with the family practice service for admission and further evaluation. I will order an MRI of the brain for further evaluation. Patient is not a TPA or code stroke candidate in that her symptoms began yesterday, > 12 hours ago.    Celene Kras, MD 05/18/12 1434

## 2012-05-18 NOTE — ED Notes (Signed)
Pt to radiology.

## 2012-05-18 NOTE — H&P (Signed)
Family Medicine Teaching [facility] Admission History and Physical Service Pager: [phone]  Patient name: Lindsey [name] Medical record number: [id number] Date of birth: 1960-08-04 Age: 51 y.o. Gender: female  Primary Care Provider: Rodman Pickle, MD  Chief Complaint: left sided weakness  History of Present Illness: Lindsey [name] is a 51 y.o. year old female presenting with left sided weakness that started at 8pm last night when she fell and needed help to get up. She fell on her left hip and her left hand, but did not hit her head. She felt like her knee was going to give out. Her niece helped her get to the chair and to bed. Denies having difficulty speaking or swallowing but reports some brief confusion. She felt dizzy at the time, with feeling that room was spinning, although this was similar to previous episodes of vertigo which she has had for the last year.  Pt woke up this morning and could not walk due to the weakness which is why she decided to come to the hospital. States she is still having the weakness in her left arm and leg at this time. Also having some numbness in hands and left side where she fell. Reports that she experienced similar weakness when she had her stroke in 2008 but it resolved.  She reports a headache that started a week ago. It is frontal and bilateral, sharp intermittent. She has a history of migraines but they usually present differently. She reports some blurred vision that started when the weakness occurred. Denies nausea/vomiting. Of note, pt mentions she's been having vertigo almost daily for the past year.  Blood pressures at home have been in the 180's/90's. States that she has seen it >200 before. Reports being hospitalized for high BP before. States that she always takes her medicines and never misses any doses.  In the ED, she was found to have a blood pressure of 191/123. Head CT showed old infarcts but no new acute findings. Family Medicine was  called for admission for stroke.    Patient Active Problem List  Diagnosis  . HYPERLIPIDEMIA  . OBESITY, MORBID  . ESSENTIAL HYPERTENSION, BENIGN  . CVA  . GASTROINTESTINAL DISORDER, FUNCTIONAL  . ARNOLD-CHIARI MALFORMATION  . VERTIGO  . SLEEP APNEA  . HEADACHE  . Other dyspnea and respiratory abnormality  . CHEST PAIN UNSPECIFIED  . EMESIS  . DIARRHEA  . CONTRAST DYE ALLERGY  . BACK PAIN, LUMBAR, WITH RADICULOPATHY  . Neuropathy  . Well woman exam  . Ear pain   Past Medical History: Past Medical History  Diagnosis Date  . Hypertension   . Chronic kidney disease   . Stroke   . Neuropathy   . Sleep apnea   . Chiari I malformation   . Migraine   . Hyperlipidemia   . CVA (cerebral infarction)    Past Surgical History: Past Surgical History  Procedure Date  . 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 Breast surgery: reduction   . Cesarean section   Bladder surgery  Social History: History  Substance Use Topics  . Smoking status: Never Smoker   . Smokeless tobacco: Not on file  . Alcohol Use: No   For any additional social history documentation, please refer to relevant sections of EMR.  Family History: Family History  Problem Relation Age of Onset  . Hyperlipidemia Mother   . Heart disease Mother   . Hypertension Mother   . Heart disease Sister   . Heart disease  Brother   Brother: stroke Allergies: Allergies  Allergen Reactions  . Iodine     Tongue swells up per pt.   No current facility-administered medications on file prior to encounter.   Current Outpatient Prescriptions on File Prior to Encounter  Medication Sig Dispense Refill  . amLODipine (NORVASC) 10 MG tablet Take 10 mg by mouth daily.        Marland Kitchen dipyridamole-aspirin (AGGRENOX) 25-200 MG per 12 hr capsule Take 1 capsule by mouth 2 (two) times daily.        . hydrochlorothiazide (HYDRODIURIL) 25 MG tablet Take 25 mg by mouth daily.        . iron polysaccharides (NIFEREX) 150 MG capsule  Take 150 mg by mouth 2 (two) times daily.        Marland Kitchen lisinopril (PRINIVIL,ZESTRIL) 20 MG tablet Take 40 mg by mouth daily.        . metoCLOPramide (REGLAN) 10 MG tablet Take 5 mg by mouth 4 (four) times daily. take one 30 min before meals and at bedtime       . metoprolol (TOPROL-XL) 50 MG 24 hr tablet Take 50 mg by mouth every morning.        . sertraline (ZOLOFT) 50 MG tablet Take 50 mg by mouth daily.        . simvastatin (ZOCOR) 40 MG tablet Take 40 mg by mouth at bedtime.        . traMADol (ULTRAM) 50 MG tablet Take 50 mg by mouth every 6 (six) hours as needed. For pain      . traZODone (DESYREL) 50 MG tablet Take 50 mg by mouth at bedtime.        Marland Kitchen acetaminophen (TYLENOL) 500 MG tablet Take 500 mg by mouth every 6 (six) hours as needed. For pain        Review Of Systems: Per HPI with the following additions:  Otherwise 12 point review of systems was performed and was unremarkable.  Physical Exam: BP 191/123  Pulse 85  Temp 98.8 F (37.1 C) (Oral)  Resp 18  SpO2 100% Exam: General: Appears well-nourished and well-developed. Obese. NAD. HEENT: PERRL. EOMI. No nystagmus. Visual fields intact. Moist mucous membranes. Neck supple. No lymphadenopathy. Tympanic membranes clear. Oropharynx pink without erythema or exudates. Cardiovascular: S1S2. RRR. 2/6 systolic murmur heard best at left upper sternal border. No rubs or gallops. No carotid bruits heard. Respiratory: CTAB. Normal effort. No respiratory distress. Abdomen: Obese, soft. Non-distended, non-tender. +BS Extremities: Moving all four limbs spontaneously. Normal ROM. No edema or erythema noted. Lateral knee and left hand digits mildly tender to palpation. Left hand/wrist not tender to palpation, with intact range of motion. No gross deformities present. Skin: Warm & dry. No rashes or bruises noted. Neuro: Cranial nerves: CN II-XII intact.   Sensation: Intact bilaterally on face, upper and lower extremities.   Strength: 4+/5 strength  in left deltoid, biceps, handgrip, hip flexion, dorsi/plantar flexion; 5/5 strength on right.  Cerebellum: Dysmetria noted on left finger-to-nose. Rapid alternating hand movements slowed on left side. Difficulty completing heel-to-shin on left.  Reflexes: 2+ left patellar reflex compared to 1+ on right. No Babinski present.   Gait: Unstable when standing and favors right side.  Alert and oriented x 4.   Labs and Imaging: CBC BMET   Lab 05/18/12 1159  WBC 9.1  HGB 12.6  HCT 36.0  PLT 322    Lab 05/18/12 1159  NA 136  K 3.8  CL 100  CO2 23  BUN 14  CREATININE 1.01  GLUCOSE 100*  CALCIUM 9.8    CT HEAD WITHOUT CONTRAST 05/18/12 Technique: Contiguous axial images were obtained from the base of  the skull through the vertex without contrast.  Comparison: MRI 04/27/2011. CT scan 04/27/2011.  Findings: There has been previous suboccipital craniectomy. There  is an old infarction in the inferior cerebellum on the left. No  apparent brain stem insult. There is an old lacunar infarction  within the right thalamus. There are extensive infarctions  throughout both basal ganglia regions. There are mild chronic  small vessel changes elsewhere within the hemispheric white  matter. No sign of acute infarction, mass lesion, hemorrhage,  hydrocephalus or extra-axial collection. There is fluid in the  left maxillary sinus.  IMPRESSION:  No acute finding. Extensive old infarctions throughout the brain  as outlined above. Previous suboccipital craniectomy.  Fluid in the left maxillary sinus.   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   # Left sided weakness in patient with previous CVA: CT head negative. Out of the therapeutic window for TPA.  With vertigo and dysmetria as well as possible diplopia, this could be a cerebellar infarct. - MRI brain  without contrast for further evaluation of likely new infarct - echo and carotid dopplers - PT/OT - patient already on secondary prevention medications: simvastatin 40, aggrenox and blood pressure meds - risk stratification labs: A1C, lipid panel  - consult neuro in am  # Left hand and knee pain s/p fall: some diffuse tenderness along 2nd,3rd and 4th PIP joints of right hand as well as some tenderness along lateral aspect of knee. - obtain hand and knee xray to rule out any fracture.   # Hypertension: uncontrolled at home. Patient states compliance with medications.  - restart home meds: amlodipine 10mg , lisinopril 40mg , metoprolol 24hr 50mg , hctz 25mg  daily.  - labetolol as needed for BP>190/110. Will not bring blood pressure down too quickly in the setting of acute CVA  # Hyperlipidemia: - continue simvastatin - lipid panel in am  # chronic back pain:  - continue home gabapentin and tramadol  Fen/Gi: NPO except sips and chips until speech cleared  PPx: heparin sub cu Dispo: admit to tele for stroke workup. Patient updated and agrees with plan.   Marena Chancy, PGY-2 Family Medicine Resident

## 2012-05-18 NOTE — ED Notes (Signed)
Pt c/o left sided weakness starting last night; pt sts fell last night and left side was weak; weakened grip strength noted; pt sts hx of CVA but denies obvious previous deficits; no speech problems or facial droop noted

## 2012-05-18 NOTE — ED Notes (Signed)
Report given to Selena Batten, Charity fundraiser.  Pt remains in radiology.

## 2012-05-19 DIAGNOSIS — G8110 Spastic hemiplegia affecting unspecified side: Secondary | ICD-10-CM

## 2012-05-19 DIAGNOSIS — R5381 Other malaise: Secondary | ICD-10-CM

## 2012-05-19 DIAGNOSIS — R5383 Other fatigue: Secondary | ICD-10-CM

## 2012-05-19 DIAGNOSIS — I6350 Cerebral infarction due to unspecified occlusion or stenosis of unspecified cerebral artery: Secondary | ICD-10-CM

## 2012-05-19 DIAGNOSIS — N179 Acute kidney failure, unspecified: Secondary | ICD-10-CM

## 2012-05-19 DIAGNOSIS — I639 Cerebral infarction, unspecified: Secondary | ICD-10-CM | POA: Diagnosis present

## 2012-05-19 DIAGNOSIS — I10 Essential (primary) hypertension: Secondary | ICD-10-CM

## 2012-05-19 DIAGNOSIS — I6330 Cerebral infarction due to thrombosis of unspecified cerebral artery: Secondary | ICD-10-CM

## 2012-05-19 LAB — CBC
HCT: 32.1 % — ABNORMAL LOW (ref 36.0–46.0)
MCHC: 34.9 g/dL (ref 30.0–36.0)
RDW: 16 % — ABNORMAL HIGH (ref 11.5–15.5)

## 2012-05-19 LAB — URINE CULTURE: Colony Count: >=100000

## 2012-05-19 LAB — LIPID PANEL: Cholesterol: 170 mg/dL (ref 0–200)

## 2012-05-19 LAB — URINALYSIS, ROUTINE W REFLEX MICROSCOPIC
Glucose, UA: NEGATIVE mg/dL
Ketones, ur: 15 mg/dL — AB
pH: 5 (ref 5.0–8.0)

## 2012-05-19 LAB — COMPREHENSIVE METABOLIC PANEL
BUN: 18 mg/dL (ref 6–23)
CO2: 22 mEq/L (ref 19–32)
Chloride: 104 mEq/L (ref 96–112)
Creatinine, Ser: 1.68 mg/dL — ABNORMAL HIGH (ref 0.50–1.10)
GFR calc non Af Amer: 35 mL/min — ABNORMAL LOW (ref >90)
Total Bilirubin: 0.5 mg/dL (ref 0.3–1.2)

## 2012-05-19 LAB — HEMOGLOBIN A1C: Mean Plasma Glucose: 94 mg/dL (ref <117)

## 2012-05-19 LAB — URINE MICROSCOPIC-ADD ON

## 2012-05-19 LAB — MRSA PCR SCREENING: MRSA by PCR: NEGATIVE

## 2012-05-19 LAB — CREATININE, URINE, RANDOM: Creatinine, Urine: 566.48 mg/dL

## 2012-05-19 NOTE — Evaluation (Signed)
Speech Language Pathology Evaluation Patient Details Name: Lindsey [name] MRN: [id number] DOB: 08-20-1960 Today's Date: 05/19/2012 Time: 7829-5621 SLP Time Calculation (min): 10 min  Problem List:  Patient Active Problem List  Diagnosis  . HYPERLIPIDEMIA  . OBESITY, MORBID  . Essential hypertension, benign  . CVA  . GASTROINTESTINAL DISORDER, FUNCTIONAL  . ARNOLD-CHIARI MALFORMATION  . VERTIGO  . SLEEP APNEA  . HEADACHE  . Other dyspnea and respiratory abnormality  . CHEST PAIN UNSPECIFIED  . EMESIS  . DIARRHEA  . CONTRAST DYE ALLERGY  . BACK PAIN, LUMBAR, WITH RADICULOPATHY  . Neuropathy  . Well woman exam  . Ear pain  . Acute kidney injury   Past Medical History:  Past Medical History  Diagnosis Date  . Hypertension   . Chronic kidney disease   . Neuropathy   . Sleep apnea   . Chiari I malformation   . Hyperlipidemia   . CVA (cerebral infarction)   . GERD (gastroesophageal reflux disease)   . Migraine   . Daily headache     "lately" (05/18/12)  . Stroke 2008;  ?2009;     "twice; once   Past Surgical History:  Past Surgical History  Procedure Date  . Reduction mammaplasty ~ 2002  . Cesarean section 3086; 1983; 1989  . Craniectomy suboccipital w/ cervical laminectomy / chiari 2000's  . Brain surgery    HPI:  51 y.o. 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 Patient was independent prior to admission without the use of assistive device. Admitted 05/18/2012 after a fall and increased left-sided weakness. MRI of the brain showed acute infarct right internal capsule right thalamus and small area of acute infarct in the left insula and left deep frontal white matter.    Assessment / Plan / Recommendation Clinical Impression  Cognitive-linguistic evaluation complete. Cognitive communication skills functional  for basic familiar ADLs however patient with very mild dysarthria, intermittently delayed processing, and decreased high level attention which may contribute to impairements with high level cognitive tasks. Given prior independence at home, recommend f/u on CIR for high level cognition related to functional ADLs to ensure safety prior to d/c home. No further acute care needs indicated at this time.     SLP Assessment  All further Speech Lanaguage Pathology  needs can be addressed in the next venue of care    Follow Up Recommendations  Inpatient Rehab       Pertinent Vitals/Pain n/a   SLP Goals  SLP Goals Progress/Goals/Alternative treatment plan discussed with pt/caregiver and they: Agree  SLP Evaluation Prior Functioning  Cognitive/Linguistic Baseline: Information not available Type of Home: Apartment Lives With: Alone Available Help at Discharge: Family (per patient, neice just moved in) Vocation: Unemployed   Cognition  Overall Cognitive Status: Impaired Arousal/Alertness: Awake/alert Orientation Level: Oriented X4 Attention: Focused;Sustained;Selective Focused Attention: Appears intact Sustained Attention: Impaired Sustained Attention Impairment: Functional complex;Verbal complex Selective Attention: Appears intact (for basic conversation) Memory: Appears intact Awareness: Appears intact Problem Solving: Appears intact (for basic functional ADLs) Behaviors: Other (comment) (delayed processing) Safety/Judgment: Appears intact    Comprehension  Auditory Comprehension Overall Auditory Comprehension: Appears within functional limits for tasks assessed Visual Recognition/Discrimination Discrimination: Within Function Limits Reading Comprehension Reading Status: Within funtional limits    Expression Expression Primary Mode of Expression: Verbal Verbal Expression Overall Verbal Expression: Appears within functional limits for tasks assessed   Oral /  Motor Oral  Motor/Sensory Function Overall Oral Motor/Sensory Function: Appears within functional limits for tasks assessed (except mild left sided buccal numbness) Motor Speech Overall Motor Speech: Impaired Respiration: Within functional limits Phonation: Normal Resonance: Within functional limits Articulation: Impaired Level of Impairment: Conversation Intelligibility: Intelligibility reduced Word: 75-100% accurate Phrase: 75-100% accurate Sentence: 75-100% accurate Conversation: 75-100% accurate Motor Planning: Witnin functional limits Effective Techniques: Slow rate;Increased vocal intensity   GO   Ferdinand Lango MA, CCC-SLP [phone]   Ferdinand Lango Meryl 05/19/2012, 3:21 PM

## 2012-05-19 NOTE — Progress Notes (Signed)
FMTS Attending Daily Note:  Jeff Jennea Rager MD  [phone] pager  Family Practice pager:  [phone] I have seen and examined this patient and have reviewed their chart. I have discussed this patient with the resident. I agree with the resident's findings, assessment and care plan.  See HP note 

## 2012-05-19 NOTE — H&P (Signed)
FMTS Attending Admission Note: Renold Don MD Personal pager:  [phone] FPTS Service Pager:  [phone]  I  have seen and examined this patient, reviewed their chart. I have discussed this patient with the resident. I agree with the resident's findings, assessment and care plan.  Briefly, 51 yo F with PMH significant for multiple recurrent ischemic and hemorrhagic strokes who presented to care with Left side weakness of over 12 hours duration.  Suffered a fall after this occurred.  Did not present to care initially because she was waiting to see if weakness resolved.  CT scan of head negative for any acute bleed.  Initial BPs elevated.  States she is compliant with medications.  Unable to name any of them.  She lives at home by herself.  Having to use a walker to ambulate due to Left leg weakness, which is new for her.    PE: Gen:  Sleepy but arousable.   Heart: RRR Lungs:  Clear Ext:  No edema Neuro:  Smile symmetric but I do note drool extending from Left side of her mouth.  Handgrip 2/5 Left hand, 4/5 Right hand.  Left LE 2/5 strength, RLE with 3/5 strength.  Some decreased sensation LLE as well.  No clonus noted  Imp/Plan: 1.  Thalamic stroke:   - New stroke noted on MRI of head.  Multiple recurrent past strokes.   - Based on review of records, patient with inconsistent outpatient appointments.  Has never seen a neurologist outside of hospital setting - Consulting Neurology here, hope is that we can get her to follow up on outpatient basis - Continue secondary prevention.  HTN elevated, LDL 90 today.   - PT for strength assessment and determination of need for placement - I'm not sure of her insurance status, she will need social work help on outpatient basis and possibly P4[facility] or Medicare Case Management  HTN: - Agree with permissive hypertension

## 2012-05-19 NOTE — Evaluation (Signed)
Occupational Therapy Evaluation Patient Details Name: Lindsey [name] MRN: [id number] DOB: 03/07/1961 Today's Date: 05/19/2012 Time: 4098-1191    OT Assessment / Plan / Recommendation Clinical Impression  Pt presnts to OT s/p admission with CVA with LUE and LLE weakness. Pt will benefit from skilled OT to increase I with ADL activity and return to PLOF    OT Assessment  Patient needs continued OT Services    Follow Up Recommendations  CIR;Supervision - Intermittent;Other (comment) (niece can provide 24/7 A per pt)    Barriers to Discharge   Pt reports her niece can provide 24/7 A upon DC from rehab  Equipment Recommendations  3 in 1 bedside comode;Tub/shower seat    Recommendations for Other Services Rehab consult  Frequency  Min 2X/week           ADL  Grooming: Performed;Wash/dry face;Minimal assistance Where Assessed - Grooming: Supported sitting Upper Body Bathing: Simulated;Maximal assistance Where Assessed - Upper Body Bathing: Unsupported sitting Lower Body Bathing: Simulated;+2 Total assistance Where Assessed - Lower Body Bathing: Supported sit to stand Upper Body Dressing: Simulated;Maximal assistance Where Assessed - Upper Body Dressing: Unsupported sitting Lower Body Dressing: Simulated;+2 Total assistance Lower Body Dressing: Patient Percentage: 50% Toilet Transfer: +2 Total assistance;Performed;Other (comment) (bed to chair) Toilet Transfer: Patient Percentage: 50% Toilet Transfer Method: Sit to stand;Stand pivot Transfers/Ambulation Related to ADLs: Pt with posterior lean in sitting.  Pt able to self correct but did continue to "sway" in sitting.  Pt did report dizziness during session.  Pts LUe presents with significant weakness, but movement in all planes ofmotion    OT Diagnosis: Hemiplegia non-dominant side;Generalized weakness  OT Problem List: Decreased strength;Decreased safety awareness;Decreased activity tolerance;Impaired UE functional use;Impaired  vision/perception;Impaired sensation;Decreased knowledge of use of DME or AE;Decreased range of motion;Decreased coordination;Impaired balance (sitting and/or standing) OT Treatment Interventions: Self-care/ADL training;DME and/or AE instruction;Patient/family education;Therapeutic exercise;Neuromuscular education   OT Goals Acute Rehab OT Goals OT Goal Formulation: With patient Time For Goal Achievement: 06/02/12 ADL Goals Pt Will Perform Grooming: with min assist;Standing at sink ADL Goal: Grooming - Progress: Goal set today Pt Will Perform Lower Body Dressing: with min assist;Sit to stand from chair ADL Goal: Lower Body Dressing - Progress: Goal set today Pt Will Transfer to Toilet: with min assist;3-in-1 ADL Goal: Toilet Transfer - Progress: Goal set today Pt Will Perform Toileting - Clothing Manipulation: with min assist;Sitting on 3-in-1 or toilet;Standing ADL Goal: Toileting - Clothing Manipulation - Progress: Goal set today Arm Goals Additional Arm Goal #1: Pt will participate in LUE NM reed in increase AROM and function  in unsupported position with min A for 15 min  Visit Information  Last OT Received On: 05/19/12       Prior Functioning     Home Living Lives With: Alone Available Help at Discharge: Family (three kids) Type of Home: Apartment Home Access: Stairs to enter Secretary/administrator of Steps: 1 Entrance Stairs-Rails: None Home Layout: One level Prior Function Vocation: Unemployed         Vision/Perception  Pt reports blurry vision, but pt was able to see clock, read sign in front of her   Cognition  Overall Cognitive Status: Appears within functional limits for tasks assessed/performed Arousal/Alertness: Lethargic Orientation Level: Appears intact for tasks assessed Behavior During Session: Lethargic    Extremity/Trunk Assessment Right Upper Extremity Assessment RUE ROM/Strength/Tone: [facility] for tasks assessed Left Upper Extremity Assessment LUE  ROM/Strength/Tone: Deficits LUE ROM/Strength/Tone Deficits: decreased strength and AROM. Pt did have movement in  all planes of motion, but presented very weak LUE Sensation: Deficits LUE Coordination: Deficits LUE Coordination Deficits: pt not able to hold onto walker or hold small object     Mobility Bed Mobility Bed Mobility: Supine to Sit Supine to Sit: 2: Max assist Transfers Transfers: Sit to Stand;Stand to Sit Sit to Stand: From chair/3-in-1;1: +2 Total assist Sit to Stand: Patient Percentage: 50% Stand to Sit: To chair/3-in-1;1: +2 Total assist Stand to Sit: Patient Percentage: 50%              End of Session OT - End of Session Equipment Utilized During Treatment: Gait belt Patient left: in chair  GO     Jacquees Gongora, [place] Kung 05/19/2012, 12:04 PM

## 2012-05-19 NOTE — Evaluation (Signed)
Clinical/Bedside Swallow Evaluation Patient Details  Name: Lindsey [name] MRN: [id number] Date of Birth: 02-Jul-1961  Today's Date: 05/19/2012 Time: 4098-1191 SLP Time Calculation (min): 15 min  Past Medical History:  Past Medical History  Diagnosis Date  . Hypertension   . Chronic kidney disease   . Neuropathy   . Sleep apnea   . Chiari I malformation   . Hyperlipidemia   . CVA (cerebral infarction)   . GERD (gastroesophageal reflux disease)   . Migraine   . Daily headache     "lately" (05/18/12)  . Stroke 2008;  ?2009;     "twice; once   Past Surgical History:  Past Surgical History  Procedure Date  . Reduction mammaplasty ~ 2002  . Cesarean section 4782; 1983; 1989  . Craniectomy suboccipital w/ cervical laminectomy / chiari 2000's  . Brain surgery    HPI:  51 y.o. 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 Patient was independent prior to admission without the use of assistive device. Admitted 05/18/2012 after a fall and increased left-sided weakness. MRI of the brain showed acute infarct right internal capsule right thalamus and small area of acute infarct in the left insula and left deep frontal white matter.  Patient passed RN stroke swallow screen however per RN, MD requested for formal evaluation.   Assessment / Plan / Recommendation Clinical Impression  Patient presents with a functional oropharyngeal swallow without overt indication of aspiration. No further SLP f/u for dysphagia needed at this time. Recommend initiation of a regular diet with thin liquids with general safe swallowing precautions.     Aspiration Risk  Mild    Diet Recommendation Regular;Thin liquid   Liquid Administration via: Cup;Straw Medication Administration: Whole meds with liquid Supervision: Patient able to self feed;Intermittent  supervision to cue for compensatory strategies Compensations: Slow rate;Small sips/bites Postural Changes and/or Swallow Maneuvers: Seated upright 90 degrees    Other  Recommendations Oral Care Recommendations: Oral care BID   Follow Up Recommendations  Inpatient Rehab (for high level cognition)       Pertinent Vitals/Pain n/a        Swallow Study    General HPI: 51 y.o. 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 Patient was independent prior to admission without the use of assistive device. Admitted 05/18/2012 after a fall and increased left-sided weakness. MRI of the brain showed acute infarct right internal capsule right thalamus and small area of acute infarct in the left insula and left deep frontal white matter.  Type of Study: Bedside swallow evaluation Previous Swallow Assessment: none Diet Prior to this Study: NPO Temperature Spikes Noted: No Respiratory Status: Room air History of Recent Intubation: No Behavior/Cognition: Alert;Cooperative;Pleasant mood Oral Cavity - Dentition: Adequate natural dentition Self-Feeding Abilities: Able to feed self Patient Positioning: Upright in chair Baseline Vocal Quality: Clear Volitional Cough: Strong Volitional Swallow: Able to elicit    Oral/Motor/Sensory Function Overall Oral Motor/Sensory Function: Appears within functional limits for tasks assessed (other than mild left sided buccal numbness)   Ice Chips Ice chips: Not tested   Thin Liquid Thin Liquid: Within functional limits Presentation: Cup;Self Fed;Straw    Nectar Thick Nectar Thick Liquid: Not tested   Honey Thick Honey Thick Liquid: Not tested   Puree Puree: Within  functional limits Presentation: Self Fed;Spoon   Solid   GO   Lindsey Paladino MA, CCC-SLP [phone]  Solid: Within functional limits Presentation:  Self Fed       Lindsey [name] Lindsey [name] 05/19/2012,3:15 PM

## 2012-05-19 NOTE — Discharge Summary (Signed)
Family Medicine Teaching [facility] Discharge Summary  Patient name: Lindsey [name] Medical record number: [id number] Date of birth: 08/03/1960 Age: 51 y.o. Gender: female Date of Admission: 05/18/2012  Date of Discharge: 05/20/2012 Admitting Physician: Tobey Grim, MD  Primary Care Provider: Rodman Pickle, MD  Indication for Hospitalization: Left-sided weakness, acute CVA  Admission Diagnoses: Principal Problem:  *CVA Active Problems: Uncontrolled hypertension Patient Active Problem List  Diagnosis  . HYPERLIPIDEMIA  . OBESITY, MORBID  . Essential hypertension, benign  . CVA  . GASTROINTESTINAL DISORDER, FUNCTIONAL  . ARNOLD-CHIARI MALFORMATION  . VERTIGO  . SLEEP APNEA  . HEADACHE  . Other dyspnea and respiratory abnormality  . CHEST PAIN UNSPECIFIED  . EMESIS  . DIARRHEA  . CONTRAST DYE ALLERGY  . BACK PAIN, LUMBAR, WITH RADICULOPATHY  . Neuropathy    Discharge Diagnoses:  Primary problem: 1. CVA* (Acute thalamic infarct) 2. HTN 3. AKI . HYPERLIPIDEMIA  . OBESITY, MORBID  . Essential hypertension, benign  . CVA  . GASTROINTESTINAL DISORDER, FUNCTIONAL  . ARNOLD-CHIARI MALFORMATION  . VERTIGO  . SLEEP APNEA  . HEADACHE  . Other dyspnea and respiratory abnormality  . CHEST PAIN UNSPECIFIED  . EMESIS  . DIARRHEA  . BACK PAIN, LUMBAR, WITH RADICULOPATHY  . Neuropathy     Consultations:  Neurology, PT, OT, CIR  Significant Labs and Imaging:  - Cr 1.01 -> 1.68 -> 2.07 - FENa = 0.06 - Hgb A1c 4.9% - LDL 90 - UA with amber color, specific gravity 1.031, moderate bili, hyaline and granular casts.  MRI Brain WO Contrast (05/18/2012) Impression: Acute infarction in the right internal capsule right thalamus. Small area of acute infarct in the left insula and left deep frontal white matter. Extensive chronic hemorrhagic infarct throughout the anterior basal ganglia bilaterally. Chronic infarcts in the left cerebellum. Air-fluid level left  maxillary sinus.  Carotid Duplex (05/20/2012) Preliminary report: Bilateral: No evidence of hemodynamically significant internal carotid artery stenosis. Vertebral artery flow is antegrade  Discharge PE:  General: Lying in bed sleeping. NAD. In no acute distress. Easily awakened.  HEENT: PERRL. Moist mucous membranes.  Cardiovascular: S1S2, RRR. 2/6 systolic murmur heard best at left upper sternal border. No rubs or gallops heard.  Respiratory: CTAB. Normal effort.  Abdomen: Obese, soft. Non-distended, non-tender, no organomegaly. +BS  Extremities: No edema or erythema noted.  Vascular: Distal pulses intact 2+.  Skin: Warm and dry. No rashes.  Neuro: Alert & oriented x 4. 4/5 strength in left deltoid, biceps, handgrip, hip flexion, 4/5 left dorsi/plantar flexion; 5/5 strength on right.  Psych: alert and oriented x4. Normal speech. Normal/appropriate mood and affect.   Brief Hospital Course: 51 yo female with PMH of multiple recurrent ischemic and hemorrhagic strokes who presents with CC of left sided weakness found to have acute thalamic infarct on MRI.  Acute Thalamic Stroke: Pt presented to ED the morning after she began experiencing left sided weakness. She was outside the tPA window. Initial CT negative for acute changes, but MRI showed acute thalamic stroke among many chronic infarcts. Continued home aggrenox, aspirin, simvastatin, home BP meds. Neuro, PT/OT, PM&R and Speech consults were placed and recommended CIR. In addition, Neuro ordered a hypercoagulation work-up.  HTN: Pt initially had BP elevated to 190s/120s in ED. Restarted home regimen of norvasc, HCTZ, lisinopril, and metoprolol. BP decreased to 120s/70s. D/c'd lisinopril because of creatinine trending up. Questionable compliance with home meds, given that BP responded to initiation of home therapy in hospital.  AKI: Cr trended  up from 1.01->1.68->2.07. Baseline in past year appears to be between 1-1.4. FENa was 0.06  consistent with pre-renal etiology. Home lisinopril was ordered on admission but d/c'd with thought that pt was likely non-compliant at home and starting high dose may have led to relative hypotension/hypoperfusion of kidney. Will recommend f/u as outpatient and consider restarting lower dose lisinopril in future.  Leukocytosis: WBC during hospitalization at 9.1->11.3->8.1->13.9. Remained afebrile with no si/sx of infection.  Hypokalemia: K+ 3.3. Replaced with K-DUR .  Chronic back pain: Continued home gabapentin and tramadol.  Discharge Medications:    Medication List     As of 05/20/2012 12:12 PM    ASK your doctor about these medications         acetaminophen 500 MG tablet   Commonly known as: TYLENOL   Take 500 mg by mouth every 6 (six) hours as needed. For pain      albuterol 108 (90 BASE) MCG/ACT inhaler   Commonly known as: PROVENTIL HFA;VENTOLIN HFA   Inhale 2 puffs into the lungs every 6 (six) hours as needed. For shortness of breath      amLODipine 10 MG tablet   Commonly known as: NORVASC   Take 10 mg by mouth daily.      aspirin EC 81 MG tablet   Take 81 mg by mouth daily.      dipyridamole-aspirin 200-25 MG per 12 hr capsule   Commonly known as: AGGRENOX   Take 1 capsule by mouth 2 (two) times daily.      gabapentin 100 MG capsule   Commonly known as: NEURONTIN   Take 300 mg by mouth at bedtime.      hydrochlorothiazide 25 MG tablet   Commonly known as: HYDRODIURIL   Take 25 mg by mouth daily.      iron polysaccharides 150 MG capsule   Commonly known as: NIFEREX   Take 150 mg by mouth 2 (two) times daily.      lisinopril 20 MG tablet   Commonly known as: PRINIVIL,ZESTRIL   Take 40 mg by mouth daily.      metoCLOPramide 10 MG tablet   Commonly known as: REGLAN   Take 5 mg by mouth 4 (four) times daily. take one 30 min before meals and at bedtime      metoprolol succinate 50 MG 24 hr tablet   Commonly known as: TOPROL-XL   Take 50 mg by  mouth every morning.      sertraline 50 MG tablet   Commonly known as: ZOLOFT   Take 50 mg by mouth daily.      simvastatin 40 MG tablet   Commonly known as: ZOCOR   Take 40 mg by mouth at bedtime.      traMADol 50 MG tablet   Commonly known as: ULTRAM   Take 50 mg by mouth every 6 (six) hours as needed. For pain      traZODone 50 MG tablet   Commonly known as: DESYREL   Take 50 mg by mouth at bedtime.         Issues for Follow Up:  1. F/u Creatinine, CBC, K+ 2. F/u BP- May need to restart low dose ACE inhibitor in future.  Outstanding Results: - F/u ECHO - F/u Hypercoagulation labs  Discharge Condition: Stable  Sharrell Ku, Med Student 05/20/2012, 12:12 PM  I have seen and examined patient and agree with above discharge summary that was fully edited.   Lloyd Huger, PGY-3 05/20/12  12:32 [phone]

## 2012-05-19 NOTE — Progress Notes (Signed)
Family Medicine Teaching Service Daily Progress Note Service Page: [phone]  Patient Assessment: 51 yo female with PMH of multiple recurrent ischemic and hemorrhagic strokes who presents with CC of left sided weakness found to have acute thalamic infarct on MRI.  Subjective: Pt states she is doing the same this morning. Feels that her weakness has not changed. Was able to rest last night. Aware of plan to get neuro and pt/ot involved.  ROS: Denies CP, SOB, N/V, fevers/chills, abdominal pain.  Objective: Temp:  [97.1 F (36.2 C)-98.9 F (37.2 C)] 98.9 F (37.2 C) (11/14 0655) Pulse Rate:  [85-105] 86  (11/14 0655) Resp:  [18] 18  (11/14 0655) BP: (133-194)/(68-123) 155/75 mmHg (11/14 0655) SpO2:  [97 %-100 %] 98 % (11/14 0655) Weight:  [84.868 kg (187 lb 1.6 oz)] 84.868 kg (187 lb 1.6 oz) (11/14 0023)  Exam: General: Lying in bed sleeping. NAD. In no acute distress. Easily awakened.  HEENT: PERRL. Moist mucous membranes. Cardiovascular: S1S2, RRR. 2/6 systolic murmur heard best at left upper sternal border. No rubs or gallops heard. Respiratory: CTAB. Normal effort. Abdomen: Obese, soft. Non-distended, non-tender, no organomegaly. +BS Extremities: Moving all four limbs spontaneously. Normal ROM. No edema or erythema noted. Vascular: Distal pulses intact 2+. Skin: Warm and dry. No rashes. Neuro: Alert & oriented x 4. 4/5 strength in left deltoid, biceps, handgrip, hip flexion, 4/5 left dorsi/plantar flexion; 4+/5 strength on right. Psych: alert and oriented x4. Normal speech. Normal/appropriate mood and affect.  I have reviewed the patient's medications, labs, imaging, and diagnostic testing.  Notable results are summarized below.  CBC BMET   Lab 05/19/12 0506 05/18/12 1802 05/18/12 1159  WBC 8.1 11.3* 9.1  HGB 11.2* 12.1 12.6  HCT 32.1* 34.2* 36.0  PLT 311 297 322    Lab 05/19/12 0506 05/18/12 1802 05/18/12 1159  NA 140 -- 136  K 3.9 -- 3.8  CL 104 -- 100  CO2 22 -- 23    BUN 18 -- 14  CREATININE 1.68* 1.11* 1.01  GLUCOSE 89 -- 100*  CALCIUM 9.5 -- 9.8     Imaging/Diagnostic Tests:  MRI Brain WO Contrast (05/18/12) Impression: Acute infarction in the right internal capsule right thalamus.  Small area of acute infarct in the left insula and left deep  frontal white matter.  Extensive chronic hemorrhagic infarct throughout the anterior basal  ganglia bilaterally. Chronic infarcts in the left cerebellum.  Air-fluid level left maxillary sinus.  Assessment & Plan: 51 yo female with PMH of multiple recurrent ischemic and hemorrhagic strokes who presents with left sided weakness found to have acute thalamic infarct and chronic hemorrhage of anterior basal ganglia on MRI.  1. Thalamic Stroke  A: New stroke noted on MRI (see above). Lipid panel with LDL 90.   P:  - appreciate neurology recommendations. Will need outpatient follow up with them as well.  - Echo and carotid dopplers pending  - PT/OT consult pending.  - Continue secondary prevention with BP control and statin therapy.  - Continue aggrenox and aspirin. F/u hypercoagulable workup per neuro.   2. HTN  A: Initially elevated to 190s/120s in ED. This AM at 150s/60-70s.   P:  - Will continue current home regimen for now. Will allow elevated BP as to not drop too quickly and risk hypoperfusion of brain.  - questionable compliance with home meds, given that BP has responded to initiation of home therapy in hospital.   3. AKI  A: Cr increased from 1.01 to 1.68 this  morning. Baseline appears to be between 1-1.4. Possibly due to HTN crisis/emergency on initial presentation.   P:  - Will order urine Na and creatinine to calculate fractional excretion Na+.  - Continue to monitor   4. Chronic back pain  P:  - Continue home gabapentin and tramadol  5. Dispo  - Will order social work consult for help on an outpatient basis with consideration of Medicare Case Management.  - Anticipate possible  discharge today or tomorrow pending PT/OT recommendations.  Marena Chancy, PGY-2 Family Medicine Resident

## 2012-05-19 NOTE — Consult Note (Signed)
NEURO HOSPITALIST CONSULT NOTE    Reason for Consult: Stroke  HPI:                                                                                                                                          Lindsey [name] is an 51 y.o. female with history of multiple recurrent ischemic and hemorrhagic strokes in the past (per note) which affected her left side (per patient) who now presented hospital with increased Left side weakness that was present on Tuesday. Weakness came on suddenly and did cause patient to fall . At present time she has no complaints of blurred vision, HA, diplopia, or paresthesia.  Initial CT head was negative for stroke however F/U MRI showed acute infarction in the right internal capsule right thalamus along with small area of acute infarct in the left insula and left deep frontal white matter. Prior to admission patient was on ASA 81 mg daily. Patient has been placed on ASA 81 mg along with Aggrenox on admission. LDL 110 and A1c pending. Currently 2 D echo and carotid doppler are pending.   Patient had a hypercoagulable panel back in 2008 which was essentially normal with exception of elevated protein C, cardiolipin IgM 10. TEE at that time also was normal.    Past Medical History  Diagnosis Date  . Hypertension   . Chronic kidney disease   . Neuropathy   . Sleep apnea   . Chiari I malformation   . Hyperlipidemia   . CVA (cerebral infarction)   . GERD (gastroesophageal reflux disease)   . Migraine   . Daily headache     "lately" (05/18/12)  . Stroke 2008;  ?2009;     "twice; once    Past Surgical History  Procedure Date  . Reduction mammaplasty ~ 2002  . Cesarean section 1610; 1983; 1989  . Craniectomy suboccipital w/ cervical laminectomy / chiari 2000's  . Brain surgery     Family History  Problem Relation Age of Onset  . Hyperlipidemia Mother   . Heart disease Mother   . Hypertension Mother   . Heart disease Sister   . Heart  disease Brother     Social History:  reports that she has never smoked. She has never used smokeless tobacco. She reports that she does not drink alcohol or use illicit drugs.  Allergies  Allergen Reactions  . Iodine Swelling    Tongue swells up per pt.    MEDICATIONS:  Prior to Admission:  Prescriptions prior to admission  Medication Sig Dispense Refill  . albuterol (PROVENTIL HFA;VENTOLIN HFA) 108 (90 BASE) MCG/ACT inhaler Inhale 2 puffs into the lungs every 6 (six) hours as needed. For shortness of breath      . amLODipine (NORVASC) 10 MG tablet Take 10 mg by mouth daily.        Marland Kitchen aspirin EC 81 MG tablet Take 81 mg by mouth daily.      Marland Kitchen dipyridamole-aspirin (AGGRENOX) 25-200 MG per 12 hr capsule Take 1 capsule by mouth 2 (two) times daily.        Marland Kitchen gabapentin (NEURONTIN) 100 MG capsule Take 300 mg by mouth at bedtime.      . hydrochlorothiazide (HYDRODIURIL) 25 MG tablet Take 25 mg by mouth daily.        . iron polysaccharides (NIFEREX) 150 MG capsule Take 150 mg by mouth 2 (two) times daily.        Marland Kitchen lisinopril (PRINIVIL,ZESTRIL) 20 MG tablet Take 40 mg by mouth daily.        . metoCLOPramide (REGLAN) 10 MG tablet Take 5 mg by mouth 4 (four) times daily. take one 30 min before meals and at bedtime       . metoprolol (TOPROL-XL) 50 MG 24 hr tablet Take 50 mg by mouth every morning.        . sertraline (ZOLOFT) 50 MG tablet Take 50 mg by mouth daily.        . simvastatin (ZOCOR) 40 MG tablet Take 40 mg by mouth at bedtime.        . traMADol (ULTRAM) 50 MG tablet Take 50 mg by mouth every 6 (six) hours as needed. For pain      . traZODone (DESYREL) 50 MG tablet Take 50 mg by mouth at bedtime.        Marland Kitchen acetaminophen (TYLENOL) 500 MG tablet Take 500 mg by mouth every 6 (six) hours as needed. For pain        Scheduled:   . amLODipine  10 mg Oral Daily  . aspirin EC  81  mg Oral Daily  . dipyridamole-aspirin  1 capsule Oral BID  . gabapentin  300 mg Oral QHS  . heparin  5,000 Units Subcutaneous Q8H  . hydrochlorothiazide  25 mg Oral Daily  . iron polysaccharides  150 mg Oral BID  . lisinopril  40 mg Oral Daily  . metoCLOPramide  5 mg Oral QID  . metoprolol succinate  50 mg Oral BH-[id number]  . sertraline  50 mg Oral Daily  . simvastatin  40 mg Oral QHS  . sodium chloride  3 mL Intravenous Q12H  . traZODone  50 mg Oral QHS     ROS:  History obtained from the patient  General ROS: negative for - chills, fatigue, fever, night sweats, weight gain or weight loss Psychological ROS: negative for - behavioral disorder, hallucinations, memory difficulties, mood swings or suicidal ideation Ophthalmic ROS: negative for - blurry vision, double vision, eye pain or loss of vision ENT ROS: negative for - epistaxis, nasal discharge, oral lesions, sore throat, tinnitus or vertigo Allergy and Immunology ROS: negative for - hives or itchy/watery eyes Hematological and Lymphatic ROS: negative for - bleeding problems, bruising or swollen lymph nodes Endocrine ROS: negative for - galactorrhea, hair pattern changes, polydipsia/polyuria or temperature intolerance Respiratory ROS: negative for - cough, hemoptysis, shortness of breath or wheezing Cardiovascular ROS: negative for - chest pain, dyspnea on exertion, edema or irregular heartbeat Gastrointestinal ROS: negative for - abdominal pain, diarrhea, hematemesis, nausea/vomiting or stool incontinence Genito-Urinary ROS: negative for - dysuria, hematuria, incontinence or urinary frequency/urgency Musculoskeletal ROS: positive for -muscular weakness Neurological ROS: as noted in HPI Dermatological ROS: negative for rash and skin lesion changes   Blood pressure 155/75, pulse 86, temperature 98.9 F  (37.2 C), temperature source Oral, resp. rate 18, height 4\' 11"  (1.499 m), weight 84.868 kg (187 lb 1.6 oz), last menstrual period 05/11/2012, SpO2 98.00%.   Neurologic Examination:                                                                                                      Mental Status: Alert, oriented, thought content appropriate.  Speech fluent without evidence of aphasia.  Able to follow 3 step commands without difficulty. Cranial Nerves: II: Discs flat bilaterally; Visual fields grossly normal, pupils equal, round, reactive to light and accommodation III,IV, VI: ptosis not present, extra-ocular motions intact bilaterally V,VII: smile symmetric, facial light touch sensation normal bilaterally VIII: hearing normal bilaterally IX,X: gag reflex present XI: bilateral shoulder shrug XII: midline tongue extension Motor: Right : Upper extremity   4+/5    Left:     Upper extremity   4-/5  Lower extremity   4+/5     Lower extremity   4-/5 --Positive drift on left UE and LE Tone and bulk:normal tone throughout; no atrophy noted Sensory: Pinprick and light touch intact throughout, bilaterally Deep Tendon Reflexes: 2+ and symmetric throughout with 1+ bilateral AJ Plantars: Right: mute   Left: mute Cerebellar: normal finger-to-nose on right with dysmetria secondary to weakness on left.   normal heel-to-shin test CV: pulses palpable throughout     Lab Results  Component Value Date/Time   CHOL 170 05/19/2012  5:06 AM    Results for orders placed during the hospital encounter of 05/18/12 (from the past 48 hour(s))  GLUCOSE, CAPILLARY     Status: Normal   Collection Time   05/18/12 11:41 AM      Component Value Range Comment   Glucose-Capillary 93  70 - 99 mg/dL    Comment 1 Documented in Chart      Comment 2 Notify RN     CBC WITH DIFFERENTIAL     Status: Abnormal   Collection Time   05/18/12 11:59  AM      Component Value Range Comment   WBC 9.1  4.0 - 10.5 K/uL    RBC  4.53  3.87 - 5.11 MIL/uL    Hemoglobin 12.6  12.0 - 15.0 g/dL    HCT 16.1  09.6 - 04.5 %    MCV 79.5  78.0 - 100.0 fL    MCH 27.8  26.0 - 34.0 pg    MCHC 35.0  30.0 - 36.0 g/dL    RDW 40.9 (*) 81.1 - 15.5 %    Platelets 322  150 - 400 K/uL    Neutrophils Relative 65  43 - 77 %    Neutro Abs 5.9  1.7 - 7.7 K/uL    Lymphocytes Relative 30  12 - 46 %    Lymphs Abs 2.7  0.7 - 4.0 K/uL    Monocytes Relative 5  3 - 12 %    Monocytes Absolute 0.5  0.1 - 1.0 K/uL    Eosinophils Relative 0  0 - 5 %    Eosinophils Absolute 0.0  0.0 - 0.7 K/uL    Basophils Relative 0  0 - 1 %    Basophils Absolute 0.0  0.0 - 0.1 K/uL   BASIC METABOLIC PANEL     Status: Abnormal   Collection Time   05/18/12 11:59 AM      Component Value Range Comment   Sodium 136  135 - 145 mEq/L    Potassium 3.8  3.5 - 5.1 mEq/L    Chloride 100  96 - 112 mEq/L    CO2 23  19 - 32 mEq/L    Glucose, Bld 100 (*) 70 - 99 mg/dL    BUN 14  6 - 23 mg/dL    Creatinine, Ser 9.14  0.50 - 1.10 mg/dL    Calcium 9.8  8.4 - 78.2 mg/dL    GFR calc non Af Amer 64 (*) >90 mL/min    GFR calc Af Amer 74 (*) >90 mL/min   POCT I-STAT TROPONIN I     Status: Normal   Collection Time   05/18/12 12:10 PM      Component Value Range Comment   Troponin i, poc 0.00  0.00 - 0.08 ng/mL    Comment 3            URINALYSIS, ROUTINE W REFLEX MICROSCOPIC     Status: Abnormal   Collection Time   05/18/12  1:00 PM      Component Value Range Comment   Color, Urine YELLOW  YELLOW    APPearance HAZY (*) CLEAR    Specific Gravity, Urine 1.034 (*) 1.005 - 1.030    pH 5.5  5.0 - 8.0    Glucose, UA NEGATIVE  NEGATIVE mg/dL    Hgb urine dipstick MODERATE (*) NEGATIVE    Bilirubin Urine SMALL (*) NEGATIVE    Ketones, ur 15 (*) NEGATIVE mg/dL    Protein, ur >956 (*) NEGATIVE mg/dL    Urobilinogen, UA 0.2  0.0 - 1.0 mg/dL    Nitrite NEGATIVE  NEGATIVE    Leukocytes, UA NEGATIVE  NEGATIVE   URINE MICROSCOPIC-ADD ON     Status: Abnormal   Collection Time    05/18/12  1:00 PM      Component Value Range Comment   Squamous Epithelial / LPF MANY (*) RARE    WBC, UA 11-20  <3 WBC/hpf    RBC / HPF 0-2  <3 RBC/hpf    Bacteria, UA MANY (*)  RARE    Casts GRANULAR CAST (*) NEGATIVE HYALINE CASTS  CBC     Status: Abnormal   Collection Time   05/18/12  6:02 PM      Component Value Range Comment   WBC 11.3 (*) 4.0 - 10.5 K/uL    RBC 4.33  3.87 - 5.11 MIL/uL    Hemoglobin 12.1  12.0 - 15.0 g/dL    HCT 21.3 (*) 08.6 - 46.0 %    MCV 79.0  78.0 - 100.0 fL    MCH 27.9  26.0 - 34.0 pg    MCHC 35.4  30.0 - 36.0 g/dL    RDW 57.8 (*) 46.9 - 15.5 %    Platelets 297  150 - 400 K/uL   CREATININE, SERUM     Status: Abnormal   Collection Time   05/18/12  6:02 PM      Component Value Range Comment   Creatinine, Ser 1.11 (*) 0.50 - 1.10 mg/dL    GFR calc non Af Amer 57 (*) >90 mL/min    GFR calc Af Amer 66 (*) >90 mL/min   MRSA PCR SCREENING     Status: Normal   Collection Time   05/18/12 11:54 PM      Component Value Range Comment   MRSA by PCR NEGATIVE  NEGATIVE   CBC     Status: Abnormal   Collection Time   05/19/12  5:06 AM      Component Value Range Comment   WBC 8.1  4.0 - 10.5 K/uL    RBC 4.08  3.87 - 5.11 MIL/uL    Hemoglobin 11.2 (*) 12.0 - 15.0 g/dL    HCT 62.9 (*) 52.8 - 46.0 %    MCV 78.7  78.0 - 100.0 fL    MCH 27.5  26.0 - 34.0 pg    MCHC 34.9  30.0 - 36.0 g/dL    RDW 41.3 (*) 24.4 - 15.5 %    Platelets 311  150 - 400 K/uL   COMPREHENSIVE METABOLIC PANEL     Status: Abnormal   Collection Time   05/19/12  5:06 AM      Component Value Range Comment   Sodium 140  135 - 145 mEq/L    Potassium 3.9  3.5 - 5.1 mEq/L    Chloride 104  96 - 112 mEq/L    CO2 22  19 - 32 mEq/L    Glucose, Bld 89  70 - 99 mg/dL    BUN 18  6 - 23 mg/dL    Creatinine, Ser 0.10 (*) 0.50 - 1.10 mg/dL    Calcium 9.5  8.4 - 27.2 mg/dL    Total Protein 7.5  6.0 - 8.3 g/dL    Albumin 3.5  3.5 - 5.2 g/dL    AST 18  0 - 37 U/L    ALT 10  0 - 35 U/L    Alkaline  Phosphatase 79  39 - 117 U/L    Total Bilirubin 0.5  0.3 - 1.2 mg/dL    GFR calc non Af Amer 35 (*) >90 mL/min    GFR calc Af Amer 40 (*) >90 mL/min   LIPID PANEL     Status: Normal   Collection Time   05/19/12  5:06 AM      Component Value Range Comment   Cholesterol 170  0 - 200 mg/dL    Triglycerides 536  <644 mg/dL    HDL 56  >03 mg/dL    Total CHOL/HDL  Ratio 3.0      VLDL 24  0 - 40 mg/dL    LDL Cholesterol 90  0 - 99 mg/dL     Ct Head Wo Contrast  05/18/2012  *RADIOLOGY REPORT*  Clinical Data: Weakness on the left.  Fall.  CT HEAD WITHOUT CONTRAST  Technique:  Contiguous axial images were obtained from the base of the skull through the vertex without contrast.  Comparison: MRI 04/27/2011.  CT scan 04/27/2011.  Findings: There has been previous suboccipital craniectomy.  There is an old infarction in the inferior cerebellum on the left.  No apparent brain stem insult.  There is an old lacunar infarction within the right thalamus.  There are extensive infarctions throughout both basal ganglia regions.  There are mild chronic small vessel changes  elsewhere within the hemispheric white matter.  No sign of acute infarction, mass lesion, hemorrhage, hydrocephalus or extra-axial collection.  There is fluid in the left maxillary sinus.  IMPRESSION: No acute finding.  Extensive old infarctions throughout the brain as outlined above.  Previous suboccipital craniectomy.  Fluid in the left maxillary sinus.   Original Report Authenticated By: Paulina Fusi, M.D.    Mr Brain Wo Contrast  05/19/2012  *RADIOLOGY REPORT*  Clinical Data: Left-sided weakness.  Rule out stroke.  Headache and dizziness.  MRI HEAD WITHOUT CONTRAST  Technique:  Multiplanar, multiecho pulse sequences of the brain and surrounding structures were obtained according to standard protocol without intravenous contrast.  Comparison: CT 05/18/2012, MRI 04/27/2011  Findings: Acute infarct in the posterior limb internal capsule on the  right and right posterior thalamus.  Small area acute infarct in the left frontal deep white matter. Small area of acute infarct in the left insula.  Multiple areas of chronic infarction.  Chronic infarcts in the left cerebellum.  Multiple areas of a chronic infarction in the anterior limb internal capsule bilaterally and anterior basal ganglia. Chronic infarct in the right caudate and right thalamus and left hypothalamus are unchanged.  There is evidence of prior hemorrhage in the anterior basal ganglia bilaterally.  Chronic microvascular ischemia is present in the white matter.  Prior suboccipital craniectomy for posterior fossa decompression, unchanged.  No mass lesion.  Air-fluid level left maxillary sinus compatible with sinusitis.  IMPRESSION: Acute infarction in the right internal capsule right thalamus. Small area of acute infarct in the left insula and left deep frontal white matter.  Extensive chronic hemorrhagic infarct throughout the anterior basal ganglia bilaterally.  Chronic infarcts in the left cerebellum.  Air-fluid level left maxillary sinus.   Original Report Authenticated By: Janeece Riggers, M.D.    Dg Knee Complete 4 Views Left  05/18/2012  *RADIOLOGY REPORT*  Clinical Data: Recent fall, weakness  LEFT KNEE - COMPLETE 4+ VIEW  Comparison: None.  Findings: The left knee joint spaces appear relatively well preserved.  No fracture is seen.  No effusion is noted.  A bony density adjacent to the anterior tibial apophysis appears old.  IMPRESSION: No acute abnormality.  No effusion.   Original Report Authenticated By: Dwyane Dee, M.D.    Dg Hand Complete Left  05/18/2012  *RADIOLOGY REPORT*  Clinical Data: Larey Seat on the left hand with pain  LEFT HAND - COMPLETE 3+ VIEW  Comparison: None.  Findings: No acute fracture is seen.  Alignment is normal.  There is a lucency through the base of the second metatarsal but this most likely represents a Mach effect, and is not the area where the patient is  complaining of  pain.  The carpal bones are in normal position and the radiocarpal joint space appears normal.  IMPRESSION: No acute fracture.   Original Report Authenticated By: Dwyane Dee, M.D.      Assessment/Plan:  [name] with bilateral infarcts in multiple vascular territories, likely cardioembolic.  Patient has had a hypercoagulable panel in 2008 which was negative along with TEE which was negative as well. Patient was on ASA prior to admission and now has been changed to Aggrenox BID plus ASA 81 mg daily. On exam she has left sided weakness and left arm and leg drift.   Recommend: 1) Continue current antiplatelet therapy and Simvastatin  2) 2 D echo, Carotid doppler, A1c 3) Repeat hypercoagulable panel 4) Continue Telemetry 5) Consider Holter monitor  6) PT/OT  Felicie Morn PA-C [facility] [phone]  Patient was personally evaluated by me and clinical assessment as well as treatment recommendations we'll also formulated by me.  Venetia Maxon M.D. [facility] [phone]  05/19/2012, 9:00 AM

## 2012-05-19 NOTE — Evaluation (Signed)
Physical Therapy Evaluation Patient Details Name: Lindsey [name] MRN: [id number] DOB: 1961/06/24 Today's Date: 05/19/2012 Time: 0865-7846 PT Time Calculation (min): 31 min  PT Assessment / Plan / Recommendation Clinical Impression  Pt is a pleasent 51 y.o. female who presents s/p CVA with LUE and LLE weakness. Pt presents with deficits in functional mobility secondary to weakness, instability, and sensory limitations. Pt will benefit from continued skilled PT to address deficits and maximize independence.  Feel patient would be a great candidate for continued inpatient rehabilitation upon discharge.    PT Assessment  Patient needs continued PT services    Follow Up Recommendations  CIR    Does the patient have the potential to tolerate intense rehabilitation    Yes  Barriers to Discharge Decreased caregiver support patient lives alone; but has family in area and can make arrangements to have family stay with her at discharge if needed.    Equipment Recommendations  3 in 1 bedside comode;Tub/shower seat    Recommendations for Other Services Rehab consult   Frequency Min 5X/week    Precautions / Restrictions Precautions Precautions: Fall   Pertinent Vitals/Pain 0/10 pain      Mobility  Bed Mobility Bed Mobility: Supine to Sit Supine to Sit: 2: Max assist Details for Bed Mobility Assistance: VCs for hand placement Transfers Transfers: Sit to Stand;Stand to Sit;Stand Pivot Transfers Sit to Stand: From chair/3-in-1;1: +2 Total assist Sit to Stand: Patient Percentage: 50% Stand to Sit: To chair/3-in-1;1: +2 Total assist Stand to Sit: Patient Percentage: 50% Stand Pivot Transfers: 1: +2 Total assist Stand Pivot Transfers: Patient Percentage: 50% Details for Transfer Assistance: VC's and manual cues for handplacement and sequencing Ambulation/Gait Ambulation/Gait Assistance: 1: +2 Total assist Ambulation/Gait: Patient Percentage: 50% Ambulation Distance (Feet): 3  Feet Assistive device: Rolling walker Ambulation/Gait Assistance Details: Manual Assist for upright and hand positioning on rw; manual assist for LE advancement Gait Pattern: Decreased stride length;Decreased dorsiflexion - left;Left hip hike;Shuffle;Lateral trunk lean to right;Narrow base of support;Trunk flexed;Scissoring General Gait Details: Pt unsteady with ambulation and unable to maintain upright without assist Stairs: No       Exercises Other Exercises Other Exercises: LE Strength assessment   PT Diagnosis: Difficulty walking;Abnormality of gait;Generalized weakness  PT Problem List: Decreased strength;Decreased range of motion;Decreased activity tolerance;Decreased balance;Decreased mobility;Impaired sensation PT Treatment Interventions: DME instruction;Gait training;Stair training;Functional mobility training;Therapeutic activities;Therapeutic exercise;Balance training;Patient/family education   PT Goals Acute Rehab PT Goals PT Goal Formulation: With patient Time For Goal Achievement: 05/24/12 Potential to Achieve Goals: Good Pt will go Supine/Side to Sit: with modified independence PT Goal: Supine/Side to Sit - Progress: Goal set today Pt will Sit at Edge of Bed: with modified independence PT Goal: Sit at Edge Of Bed - Progress: Goal set today Pt will go Sit to Stand: with modified independence PT Goal: Sit to Stand - Progress: Goal set today Pt will go Stand to Sit: with modified independence PT Goal: Stand to Sit - Progress: Goal set today Pt will Ambulate: 51 - 150 feet;with modified independence;with least restrictive assistive device PT Goal: Ambulate - Progress: Goal set today  Visit Information  Last PT Received On: 05/19/12    Subjective Data  Subjective: Pt pleasent and agreeable to PT Patient Stated Goal: To get better   Prior Functioning  Home Living Lives With: Alone Available Help at Discharge: Family (three kids) Type of Home: Apartment Home Access:  Stairs to enter [employer] of Steps: 1 Entrance Stairs-Rails: None Home Layout: One level  Home Adaptive Equipment: Walker - rolling Prior Function Level of Independence: Independent Able to Take Stairs?: Yes Driving: No Vocation: Unemployed    Cognition  Overall Cognitive Status: Appears within functional limits for tasks assessed/performed Arousal/Alertness: Lethargic Orientation Level: Appears intact for tasks assessed Behavior During Session: Lethargic    Extremity/Trunk Assessment Right Upper Extremity Assessment RUE ROM/Strength/Tone: [facility] for tasks assessed Left Upper Extremity Assessment LUE ROM/Strength/Tone: Deficits LUE ROM/Strength/Tone Deficits: decreased strength and AROM. Pt did have movement in all planes of motion, but presented very weak LUE Sensation: Deficits LUE Coordination: Deficits LUE Coordination Deficits: pt not able to hold onto walker or hold small object Right Lower Extremity Assessment RLE ROM/Strength/Tone: [facility] for tasks assessed Left Lower Extremity Assessment LLE ROM/Strength/Tone: Deficits LLE ROM/Strength/Tone Deficits: decreased strength upon testing in supine LLE Sensation: Deficits LLE Coordination: Deficits (possibly 2ndary to weakness)   Balance Balance Balance Assessed: Yes Static Sitting Balance Static Sitting - Balance Support: Feet supported;No upper extremity supported Static Sitting - Level of Assistance: 5: Stand by assistance;4: Min assist Static Sitting - Comment/# of Minutes: patient demo good trunk support but presents with posterior lean/drift with lethargy.  Pt is able to correct with cues.  End of Session PT - End of Session Equipment Utilized During Treatment: Gait belt Activity Tolerance: Patient limited by fatigue Patient left: in chair;with call bell/phone within reach Nurse Communication: Mobility status  GP     Fabio Asa 05/19/2012, 12:58 PM  Charlotte Crumb, PT DPT  [phone]

## 2012-05-19 NOTE — Progress Notes (Signed)
PRehab Admissions Coordinator Note:  Patient was screened by Clois Dupes for appropriateness for an Inpatient Acute Rehab Consult.  At this time, we are recommending Inpatient Rehab consult which is in process.  Clois Dupes, RN 05/19/2012, 1:39 PM  I can be reached at [phone].

## 2012-05-19 NOTE — Consult Note (Signed)
Physical Medicine and Rehabilitation Consult Reason for Consult: CVA Referring Physician: Family medicine   HPI: Lindsey [name] is a 51 y.o. 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 Patient was independent prior to admission without the use of assistive device. Admitted 05/18/2012 after a fall and increased left-sided weakness. MRI of the brain showed acute infarct right internal capsule right thalamus and small area of acute infarct in the left insula and left deep frontal white matter. Echocardiogram and carotid Dopplers are pending. Patient was on aspirin prior to admission along with Aggrenox. Neurology services consulted with workup ongoing. Subcutaneous heparin has been added for DVT prophylaxis. Physical and occupational therapy evaluations completed with recommendations of physical medicine rehabilitation consult to consider inpatient rehabilitation services   Review of Systems  Musculoskeletal: Positive for myalgias and falls.  Neurological: Positive for weakness.  Psychiatric/Behavioral: Positive for depression. The patient has insomnia.   All other systems reviewed and are negative.   Past Medical History  Diagnosis Date  . Hypertension   . Chronic kidney disease   . Neuropathy   . Sleep apnea   . Chiari I malformation   . Hyperlipidemia   . CVA (cerebral infarction)   . GERD (gastroesophageal reflux disease)   . Migraine   . Daily headache     "lately" (05/18/12)  . Stroke 2008;  ?2009;     "twice; once   Past Surgical History  Procedure Date  . Reduction mammaplasty ~ 2002  . Cesarean section 9604; 1983; 1989  . Craniectomy suboccipital w/ cervical laminectomy / chiari 2000's  . Brain surgery    Family History  Problem Relation Age of Onset  . Hyperlipidemia Mother   . Heart disease Mother   . Hypertension  Mother   . Heart disease Sister   . Heart disease Brother    Social History:  reports that she has never smoked. She has never used smokeless tobacco. She reports that she does not drink alcohol or use illicit drugs. Allergies:  Allergies  Allergen Reactions  . Iodine Swelling    Tongue swells up per pt.   Medications Prior to Admission  Medication Sig Dispense Refill  . albuterol (PROVENTIL HFA;VENTOLIN HFA) 108 (90 BASE) MCG/ACT inhaler Inhale 2 puffs into the lungs every 6 (six) hours as needed. For shortness of breath      . amLODipine (NORVASC) 10 MG tablet Take 10 mg by mouth daily.        Marland Kitchen aspirin EC 81 MG tablet Take 81 mg by mouth daily.      Marland Kitchen dipyridamole-aspirin (AGGRENOX) 25-200 MG per 12 hr capsule Take 1 capsule by mouth 2 (two) times daily.        Marland Kitchen gabapentin (NEURONTIN) 100 MG capsule Take 300 mg by mouth at bedtime.      . hydrochlorothiazide (HYDRODIURIL) 25 MG tablet Take 25 mg by mouth daily.        . iron polysaccharides (NIFEREX) 150 MG capsule Take 150 mg by mouth 2 (two) times daily.        Marland Kitchen lisinopril (PRINIVIL,ZESTRIL) 20 MG tablet Take 40 mg by mouth daily.        . metoCLOPramide (REGLAN) 10 MG tablet Take 5 mg by mouth 4 (four) times daily. take one 30 min before meals and at bedtime       . metoprolol (TOPROL-XL) 50 MG 24  hr tablet Take 50 mg by mouth every morning.        . sertraline (ZOLOFT) 50 MG tablet Take 50 mg by mouth daily.        . simvastatin (ZOCOR) 40 MG tablet Take 40 mg by mouth at bedtime.        . traMADol (ULTRAM) 50 MG tablet Take 50 mg by mouth every 6 (six) hours as needed. For pain      . traZODone (DESYREL) 50 MG tablet Take 50 mg by mouth at bedtime.        Marland Kitchen acetaminophen (TYLENOL) 500 MG tablet Take 500 mg by mouth every 6 (six) hours as needed. For pain         Home: Home Living Lives With: Alone Available Help at Discharge: Family (three kids) Type of Home: Apartment Home Access: Stairs to enter Entrance Stairs-Number  of Steps: 1 Entrance Stairs-Rails: None Home Layout: One level Home Adaptive Equipment: Walker - rolling  Functional History: Prior Function Able to Take Stairs?: Yes Driving: No Vocation: Unemployed Functional Status:  Mobility: Bed Mobility Bed Mobility: Supine to Sit Supine to Sit: 2: Max assist Transfers Transfers: Sit to Stand;Stand to [employer] Transfers Sit to Stand: From chair/3-in-1;1: +2 Total assist Sit to Stand: Patient Percentage: 50% Stand to Sit: To chair/3-in-1;1: +2 Total assist Stand to Sit: Patient Percentage: 50% Stand Pivot Transfers: 1: +2 Total assist Stand Pivot Transfers: Patient Percentage: 50% Ambulation/Gait Ambulation/Gait Assistance: 1: +2 Total assist Ambulation/Gait: Patient Percentage: 50% Ambulation Distance (Feet): 3 Feet Assistive device: Rolling walker Ambulation/Gait Assistance Details: Manual Assist for upright and hand positioning on rw; manual assist for LE advancement Gait Pattern: Decreased stride length;Decreased dorsiflexion - left;Left hip hike;Shuffle;Lateral trunk lean to right;Narrow base of support;Trunk flexed;Scissoring General Gait Details: Pt unsteady with ambulation and unable to maintain upright without assist Stairs: No    ADL: ADL Grooming: Performed;Wash/dry face;Minimal assistance Where Assessed - Grooming: Supported sitting Upper Body Bathing: Simulated;Maximal assistance Where Assessed - Upper Body Bathing: Unsupported sitting Lower Body Bathing: Simulated;+2 Total assistance Where Assessed - Lower Body Bathing: Supported sit to stand Upper Body Dressing: Simulated;Maximal assistance Where Assessed - Upper Body Dressing: Unsupported sitting Lower Body Dressing: Simulated;+2 Total assistance Toilet Transfer: +2 Total assistance;Performed;Other (comment) (bed to chair) Toilet Transfer Method: Sit to stand;Stand pivot Transfers/Ambulation Related to ADLs: Pt with posterior lean in sitting.  Pt able to self  correct but did continue to "sway" in sitting.  Pt did report dizziness during session.  Pts LUe presents with significant weakness, but movement in all planes ofmotion  Cognition: Cognition Arousal/Alertness: Lethargic Orientation Level: Oriented X4 Cognition Overall Cognitive Status: Appears within functional limits for tasks assessed/performed Arousal/Alertness: Lethargic Orientation Level: Appears intact for tasks assessed Behavior During Session: Lethargic  Blood pressure 150/76, pulse 82, temperature 98.2 F (36.8 C), temperature source Oral, resp. rate 18, height 4\' 11"  (1.499 m), weight 84.868 kg (187 lb 1.6 oz), last menstrual period 05/11/2012, SpO2 98.00%. Physical Exam  Vitals reviewed. Constitutional: She is oriented to person, place, and time.  HENT:  Head: Normocephalic.  Eyes:       Pupils round and reactive to light  Neck: Normal range of motion. Neck supple. No thyromegaly present.  Cardiovascular: Normal rate and regular rhythm.   Pulmonary/Chest: Effort normal and breath sounds normal. No respiratory distress.  Abdominal: Soft. Bowel sounds are normal. She exhibits no distension. There is no tenderness.  Neurological: She is alert and oriented to person, place, and time.  Patient follows three-step commands. Noted flat affect but appropriate during exam  Skin: Skin is warm and dry.  3 minus/5 in the left deltoid, biceps, triceps, grip 5/5 in the right deltoid, biceps, triceps, grip left hip flexor, Knee extensors, ankle dorsiflexor plantar flexor 5/5 in the right hip flexor knee extensor ankle dorsiflexor plantar flexor Sensation is intact to light touch No evidence of ataxia No evidence of dysarthria  Results for orders placed during the hospital encounter of 05/18/12 (from the past 24 hour(s))  CBC     Status: Abnormal   Collection Time   05/18/12  6:02 PM      Component Value Range   WBC 11.3 (*) 4.0 - 10.5 K/uL   RBC 4.33  3.87 - 5.11  MIL/uL   Hemoglobin 12.1  12.0 - 15.0 g/dL   HCT 16.1 (*) 09.6 - 04.5 %   MCV 79.0  78.0 - 100.0 fL   MCH 27.9  26.0 - 34.0 pg   MCHC 35.4  30.0 - 36.0 g/dL   RDW 40.9 (*) 81.1 - 91.4 %   Platelets 297  150 - 400 K/uL  CREATININE, SERUM     Status: Abnormal   Collection Time   05/18/12  6:02 PM      Component Value Range   Creatinine, Ser 1.11 (*) 0.50 - 1.10 mg/dL   GFR calc non Af Amer 57 (*) >90 mL/min   GFR calc Af Amer 66 (*) >90 mL/min  MRSA PCR SCREENING     Status: Normal   Collection Time   05/18/12 11:54 PM      Component Value Range   MRSA by PCR NEGATIVE  NEGATIVE  CBC     Status: Abnormal   Collection Time   05/19/12  5:06 AM      Component Value Range   WBC 8.1  4.0 - 10.5 K/uL   RBC 4.08  3.87 - 5.11 MIL/uL   Hemoglobin 11.2 (*) 12.0 - 15.0 g/dL   HCT 78.2 (*) 95.6 - 21.3 %   MCV 78.7  78.0 - 100.0 fL   MCH 27.5  26.0 - 34.0 pg   MCHC 34.9  30.0 - 36.0 g/dL   RDW 08.6 (*) 57.8 - 46.9 %   Platelets 311  150 - 400 K/uL  HEMOGLOBIN A1C     Status: Normal   Collection Time   05/19/12  5:06 AM      Component Value Range   Hemoglobin A1C 4.9  <5.7 %   Mean Plasma Glucose 94  <117 mg/dL  COMPREHENSIVE METABOLIC PANEL     Status: Abnormal   Collection Time   05/19/12  5:06 AM      Component Value Range   Sodium 140  135 - 145 mEq/L   Potassium 3.9  3.5 - 5.1 mEq/L   Chloride 104  96 - 112 mEq/L   CO2 22  19 - 32 mEq/L   Glucose, Bld 89  70 - 99 mg/dL   BUN 18  6 - 23 mg/dL   Creatinine, Ser 6.29 (*) 0.50 - 1.10 mg/dL   Calcium 9.5  8.4 - 52.8 mg/dL   Total Protein 7.5  6.0 - 8.3 g/dL   Albumin 3.5  3.5 - 5.2 g/dL   AST 18  0 - 37 U/L   ALT 10  0 - 35 U/L   Alkaline Phosphatase 79  39 - 117 U/L   Total Bilirubin 0.5  0.3 - 1.2 mg/dL  GFR calc non Af Amer 35 (*) >90 mL/min   GFR calc Af Amer 40 (*) >90 mL/min  LIPID PANEL     Status: Normal   Collection Time   05/19/12  5:06 AM      Component Value Range   Cholesterol 170  0 - 200 mg/dL    Triglycerides 161  <096 mg/dL   HDL 56  >04 mg/dL   Total CHOL/HDL Ratio 3.0     VLDL 24  0 - 40 mg/dL   LDL Cholesterol 90  0 - 99 mg/dL   Ct Head Wo Contrast  05/18/2012  *RADIOLOGY REPORT*  Clinical Data: Weakness on the left.  Fall.  CT HEAD WITHOUT CONTRAST  Technique:  Contiguous axial images were obtained from the base of the skull through the vertex without contrast.  Comparison: MRI 04/27/2011.  CT scan 04/27/2011.  Findings: There has been previous suboccipital craniectomy.  There is an old infarction in the inferior cerebellum on the left.  No apparent brain stem insult.  There is an old lacunar infarction within the right thalamus.  There are extensive infarctions throughout both basal ganglia regions.  There are mild chronic small vessel changes  elsewhere within the hemispheric white matter.  No sign of acute infarction, mass lesion, hemorrhage, hydrocephalus or extra-axial collection.  There is fluid in the left maxillary sinus.  IMPRESSION: No acute finding.  Extensive old infarctions throughout the brain as outlined above.  Previous suboccipital craniectomy.  Fluid in the left maxillary sinus.   Original Report Authenticated By: Paulina Fusi, M.D.    Mr Brain Wo Contrast  05/19/2012  *RADIOLOGY REPORT*  Clinical Data: Left-sided weakness.  Rule out stroke.  Headache and dizziness.  MRI HEAD WITHOUT CONTRAST  Technique:  Multiplanar, multiecho pulse sequences of the brain and surrounding structures were obtained according to standard protocol without intravenous contrast.  Comparison: CT 05/18/2012, MRI 04/27/2011  Findings: Acute infarct in the posterior limb internal capsule on the right and right posterior thalamus.  Small area acute infarct in the left frontal deep white matter. Small area of acute infarct in the left insula.  Multiple areas of chronic infarction.  Chronic infarcts in the left cerebellum.  Multiple areas of a chronic infarction in the anterior limb internal capsule  bilaterally and anterior basal ganglia. Chronic infarct in the right caudate and right thalamus and left hypothalamus are unchanged.  There is evidence of prior hemorrhage in the anterior basal ganglia bilaterally.  Chronic microvascular ischemia is present in the white matter.  Prior suboccipital craniectomy for posterior fossa decompression, unchanged.  No mass lesion.  Air-fluid level left maxillary sinus compatible with sinusitis.  IMPRESSION: Acute infarction in the right internal capsule right thalamus. Small area of acute infarct in the left insula and left deep frontal white matter.  Extensive chronic hemorrhagic infarct throughout the anterior basal ganglia bilaterally.  Chronic infarcts in the left cerebellum.  Air-fluid level left maxillary sinus.   Original Report Authenticated By: Janeece Riggers, M.D.    Dg Knee Complete 4 Views Left  05/18/2012  *RADIOLOGY REPORT*  Clinical Data: Recent fall, weakness  LEFT KNEE - COMPLETE 4+ VIEW  Comparison: None.  Findings: The left knee joint spaces appear relatively well preserved.  No fracture is seen.  No effusion is noted.  A bony density adjacent to the anterior tibial apophysis appears old.  IMPRESSION: No acute abnormality.  No effusion.   Original Report Authenticated By: Dwyane Dee, M.D.    Dg Hand  Complete Left  05/18/2012  *RADIOLOGY REPORT*  Clinical Data: Larey Seat on the left hand with pain  LEFT HAND - COMPLETE 3+ VIEW  Comparison: None.  Findings: No acute fracture is seen.  Alignment is normal.  There is a lucency through the base of the second metatarsal but this most likely represents a Mach effect, and is not the area where the patient is complaining of pain.  The carpal bones are in normal position and the radiocarpal joint space appears normal.  IMPRESSION: No acute fracture.   Original Report Authenticated By: Dwyane Dee, M.D.     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 Does the need for close, 24 hr/day medical supervision in concert with the patient's rehab needs make it unreasonable for this patient to be served in a less intensive setting? Yes 2. Co-Morbidities requiring supervision/potential complications: Acute kidney injury, morbid obesity, Hypertension 3. Due to bladder management, bowel management, safety, skin/wound care, disease management, medication administration, pain management and patient education, does the patient require 24 hr/day rehab nursing? Yes 4. Does the patient require coordinated care of a physician, rehab nurse, PT (1-2 hrs/day, 5 days/week) and OT (1-2 hrs/day, 5 days/week) to address physical and functional deficits in the context of the above medical diagnosis(es)? Yes Addressing deficits in the following areas: balance, endurance, locomotion, strength, transferring, bowel/bladder control, bathing, dressing, feeding, grooming and toileting 5. Can the patient actively participate in an intensive therapy program of at least 3 hrs of therapy per day at least 5 days per week? Yes 6. The potential for patient to make measurable gains while on inpatient rehab is excellent 7. Anticipated functional outcomes upon discharge from inpatient rehab are Modified independent mobility with PT, Modified independent ADLs with OT, Not applicable with SLP. 8. Estimated rehab length of stay to reach the above functional goals is: 2 weeks 9. Does the patient have adequate social supports to accommodate these discharge functional goals? Potentially 10. Anticipated D/C setting: Home 11. Anticipated post D/C treatments: HH therapy 12. Overall Rehab/Functional Prognosis: excellent  RECOMMENDATIONS: This patient's condition is appropriate for continued rehabilitative care in the following setting: CIR Patient has agreed to participate in recommended program. Yes Note that insurance prior authorization may be required for reimbursement for  recommended care.  Comment:    05/19/2012

## 2012-05-20 ENCOUNTER — Inpatient Hospital Stay (HOSPITAL_COMMUNITY)
Admission: RE | Admit: 2012-05-20 | Discharge: 2012-05-31 | DRG: 945 | Disposition: A | Discharge status: Home / Self Care | Payer: Medicare Other | Source: Ambulatory Visit | Attending: Physical Medicine & Rehabilitation | Admitting: Physical Medicine & Rehabilitation

## 2012-05-20 ENCOUNTER — Encounter (HOSPITAL_COMMUNITY): Payer: Self-pay | Admitting: *Deleted

## 2012-05-20 DIAGNOSIS — G43909 Migraine, unspecified, not intractable, without status migrainosus: Secondary | ICD-10-CM

## 2012-05-20 DIAGNOSIS — K5900 Constipation, unspecified: Secondary | ICD-10-CM

## 2012-05-20 DIAGNOSIS — I6330 Cerebral infarction due to thrombosis of unspecified cerebral artery: Secondary | ICD-10-CM

## 2012-05-20 DIAGNOSIS — Z8673 Personal history of transient ischemic attack (TIA), and cerebral infarction without residual deficits: Secondary | ICD-10-CM

## 2012-05-20 DIAGNOSIS — I6350 Cerebral infarction due to unspecified occlusion or stenosis of unspecified cerebral artery: Secondary | ICD-10-CM

## 2012-05-20 DIAGNOSIS — Z79899 Other long term (current) drug therapy: Secondary | ICD-10-CM

## 2012-05-20 DIAGNOSIS — S7000XA Contusion of unspecified hip, initial encounter: Secondary | ICD-10-CM

## 2012-05-20 DIAGNOSIS — R1013 Epigastric pain: Secondary | ICD-10-CM

## 2012-05-20 DIAGNOSIS — K219 Gastro-esophageal reflux disease without esophagitis: Secondary | ICD-10-CM

## 2012-05-20 DIAGNOSIS — I10 Essential (primary) hypertension: Secondary | ICD-10-CM | POA: Diagnosis present

## 2012-05-20 DIAGNOSIS — R29898 Other symptoms and signs involving the musculoskeletal system: Secondary | ICD-10-CM

## 2012-05-20 DIAGNOSIS — R279 Unspecified lack of coordination: Secondary | ICD-10-CM

## 2012-05-20 DIAGNOSIS — Z7982 Long term (current) use of aspirin: Secondary | ICD-10-CM

## 2012-05-20 DIAGNOSIS — I517 Cardiomegaly: Secondary | ICD-10-CM

## 2012-05-20 DIAGNOSIS — K3189 Other diseases of stomach and duodenum: Secondary | ICD-10-CM

## 2012-05-20 DIAGNOSIS — E785 Hyperlipidemia, unspecified: Secondary | ICD-10-CM

## 2012-05-20 DIAGNOSIS — R42 Dizziness and giddiness: Secondary | ICD-10-CM

## 2012-05-20 DIAGNOSIS — I129 Hypertensive chronic kidney disease with stage 1 through stage 4 chronic kidney disease, or unspecified chronic kidney disease: Secondary | ICD-10-CM

## 2012-05-20 DIAGNOSIS — Z5189 Encounter for other specified aftercare: Principal | ICD-10-CM

## 2012-05-20 DIAGNOSIS — I639 Cerebral infarction, unspecified: Secondary | ICD-10-CM

## 2012-05-20 DIAGNOSIS — N189 Chronic kidney disease, unspecified: Secondary | ICD-10-CM

## 2012-05-20 DIAGNOSIS — G609 Hereditary and idiopathic neuropathy, unspecified: Secondary | ICD-10-CM

## 2012-05-20 DIAGNOSIS — W19XXXA Unspecified fall, initial encounter: Secondary | ICD-10-CM

## 2012-05-20 DIAGNOSIS — I6789 Other cerebrovascular disease: Secondary | ICD-10-CM

## 2012-05-20 DIAGNOSIS — G4730 Sleep apnea, unspecified: Secondary | ICD-10-CM

## 2012-05-20 LAB — BASIC METABOLIC PANEL
CO2: 23 mEq/L (ref 19–32)
Calcium: 8.9 mg/dL (ref 8.4–10.5)
Chloride: 100 mEq/L (ref 96–112)
Creatinine, Ser: 2.07 mg/dL — ABNORMAL HIGH (ref 0.50–1.10)
Glucose, Bld: 87 mg/dL (ref 70–99)
Sodium: 134 mEq/L — ABNORMAL LOW (ref 135–145)

## 2012-05-20 LAB — CBC
HCT: 29.1 % — ABNORMAL LOW (ref 36.0–46.0)
MCH: 27.9 pg (ref 26.0–34.0)
MCV: 78.9 fL (ref 78.0–100.0)
Platelets: 281 10*3/uL (ref 150–400)
RBC: 3.69 MIL/uL — ABNORMAL LOW (ref 3.87–5.11)

## 2012-05-20 LAB — CARDIOLIPIN ANTIBODIES, IGG, IGM, IGA
Anticardiolipin IgG: 10 GPL U/mL — ABNORMAL LOW (ref <23)
Anticardiolipin IgM: 11 MPL U/mL — ABNORMAL HIGH (ref <11)

## 2012-05-20 LAB — LUPUS ANTICOAGULANT PANEL

## 2012-05-20 MED ORDER — SIMVASTATIN 40 MG PO TABS: 40.0000 mg | ORAL_TABLET | Freq: Every day | ORAL | Status: DC

## 2012-05-20 MED ORDER — ASPIRIN EC 81 MG PO TBEC
81.0000 mg | DELAYED_RELEASE_TABLET | Freq: Every day | ORAL | Status: DC
  Administered 2012-05-21 – 2012-05-31 (×11): 81 mg via ORAL
  Filled 2012-05-20 (×13): qty 1

## 2012-05-20 MED ORDER — TRAZODONE HCL 50 MG PO TABS: 50.0000 mg | ORAL_TABLET | Freq: Every day | ORAL | Status: DC

## 2012-05-20 MED ORDER — ENOXAPARIN SODIUM 40 MG/0.4ML ~~LOC~~ SOLN
40.0000 mg | SUBCUTANEOUS | Status: DC
  Administered 2012-05-20 – 2012-05-30 (×11): 40 mg via SUBCUTANEOUS
  Filled 2012-05-20 (×12): qty 0.4

## 2012-05-20 MED ORDER — POTASSIUM CHLORIDE IN NACL 20-0.9 MEQ/L-% IV SOLN
INTRAVENOUS | Status: DC
  Administered 2012-05-20 – 2012-05-22 (×4): via INTRAVENOUS
  Filled 2012-05-20 (×6): qty 1000

## 2012-05-20 MED ORDER — PROCHLORPERAZINE MALEATE 5 MG PO TABS
5.0000 mg | ORAL_TABLET | Freq: Four times a day (QID) | ORAL | Status: DC | PRN
  Administered 2012-05-22: 10 mg via ORAL
  Administered 2012-05-24 – 2012-05-25 (×2): 5 mg via ORAL
  Administered 2012-05-26: 10 mg via ORAL
  Filled 2012-05-20 (×3): qty 2

## 2012-05-20 MED ORDER — POLYETHYLENE GLYCOL 3350 17 G PO PACK
17.0000 g | PACK | Freq: Every day | ORAL | Status: DC | PRN
  Filled 2012-05-20: qty 1

## 2012-05-20 MED ORDER — DIPHENHYDRAMINE HCL 12.5 MG/5ML PO ELIX
12.5000 mg | ORAL_SOLUTION | Freq: Four times a day (QID) | ORAL | Status: DC | PRN
  Filled 2012-05-20: qty 10

## 2012-05-20 MED ORDER — ALBUTEROL SULFATE HFA 108 (90 BASE) MCG/ACT IN AERS
2.0000 | INHALATION_SPRAY | Freq: Four times a day (QID) | RESPIRATORY_TRACT | Status: DC | PRN
  Filled 2012-05-20: qty 6.7

## 2012-05-20 MED ORDER — PROCHLORPERAZINE 25 MG RE SUPP
12.5000 mg | Freq: Four times a day (QID) | RECTAL | Status: DC | PRN
  Filled 2012-05-20: qty 1

## 2012-05-20 MED ORDER — GUAIFENESIN-DM 100-10 MG/5ML PO SYRP: 5.0000 mL | ORAL_SOLUTION | Freq: Four times a day (QID) | ORAL | Status: DC | PRN

## 2012-05-20 MED ORDER — BISACODYL 10 MG RE SUPP: 10.0000 mg | Freq: Every day | RECTAL | Status: DC | PRN

## 2012-05-20 MED ORDER — ATORVASTATIN CALCIUM 10 MG PO TABS
10.0000 mg | ORAL_TABLET | Freq: Every day | ORAL | Status: DC
  Administered 2012-05-20 – 2012-05-30 (×11): 10 mg via ORAL
  Filled 2012-05-20 (×12): qty 1

## 2012-05-20 MED ORDER — HYDROCHLOROTHIAZIDE 25 MG PO TABS
25.0000 mg | ORAL_TABLET | Freq: Every day | ORAL | Status: DC
  Administered 2012-05-21 – 2012-05-22 (×2): 25 mg via ORAL
  Filled 2012-05-20 (×3): qty 1

## 2012-05-20 MED ORDER — AMLODIPINE BESYLATE 10 MG PO TABS
10.0000 mg | ORAL_TABLET | Freq: Every day | ORAL | Status: DC
  Administered 2012-05-21 – 2012-05-31 (×11): 10 mg via ORAL
  Filled 2012-05-20 (×13): qty 1

## 2012-05-20 MED ORDER — ONDANSETRON HCL 4 MG PO TABS
4.0000 mg | ORAL_TABLET | Freq: Three times a day (TID) | ORAL | Status: DC | PRN
  Administered 2012-05-20: 4 mg via ORAL
  Filled 2012-05-20: qty 1

## 2012-05-20 MED ORDER — SERTRALINE HCL 50 MG PO TABS
50.0000 mg | ORAL_TABLET | Freq: Every day | ORAL | Status: DC
  Administered 2012-05-21 – 2012-05-26 (×6): 50 mg via ORAL
  Filled 2012-05-20 (×8): qty 1

## 2012-05-20 MED ORDER — POLYSACCHARIDE IRON COMPLEX 150 MG PO CAPS
150.0000 mg | ORAL_CAPSULE | Freq: Two times a day (BID) | ORAL | Status: DC
  Administered 2012-05-20 – 2012-05-31 (×22): 150 mg via ORAL
  Filled 2012-05-20 (×27): qty 1

## 2012-05-20 MED ORDER — SODIUM CHLORIDE 0.9 % IV SOLN
INTRAVENOUS | Status: DC
  Administered 2012-05-20: 999 mL via INTRAVENOUS

## 2012-05-20 MED ORDER — TRAZODONE HCL 50 MG PO TABS: 25.0000 mg | ORAL_TABLET | Freq: Every evening | ORAL | Status: DC | PRN

## 2012-05-20 MED ORDER — POLYETHYLENE GLYCOL 3350 17 G PO PACK
17.0000 g | PACK | Freq: Every day | ORAL | Status: DC
  Administered 2012-05-20 – 2012-05-22 (×2): 17 g via ORAL
  Filled 2012-05-20 (×6): qty 1

## 2012-05-20 MED ORDER — POTASSIUM CHLORIDE CRYS ER 20 MEQ PO TBCR
40.0000 meq | EXTENDED_RELEASE_TABLET | Freq: Once | ORAL | Status: AC
  Administered 2012-05-20: 40 meq via ORAL
  Filled 2012-05-20: qty 2

## 2012-05-20 MED ORDER — PROCHLORPERAZINE EDISYLATE 5 MG/ML IJ SOLN
5.0000 mg | Freq: Four times a day (QID) | INTRAMUSCULAR | Status: DC | PRN
  Filled 2012-05-20: qty 2

## 2012-05-20 MED ORDER — GABAPENTIN 300 MG PO CAPS
300.0000 mg | ORAL_CAPSULE | Freq: Every day | ORAL | Status: DC
  Administered 2012-05-20 – 2012-05-30 (×11): 300 mg via ORAL
  Filled 2012-05-20 (×13): qty 1

## 2012-05-20 MED ORDER — FLEET ENEMA 7-19 GM/118ML RE ENEM: 1.0000 | ENEMA | Freq: Once | RECTAL | Status: AC | PRN

## 2012-05-20 MED ORDER — ACETAMINOPHEN 325 MG PO TABS
325.0000 mg | ORAL_TABLET | ORAL | Status: DC | PRN
  Administered 2012-05-21 – 2012-05-30 (×7): 650 mg via ORAL
  Administered 2012-05-30: 325 mg via ORAL
  Administered 2012-05-31: 650 mg via ORAL
  Filled 2012-05-20 (×8): qty 2

## 2012-05-20 MED ORDER — ALUM & MAG HYDROXIDE-SIMETH 200-200-20 MG/5ML PO SUSP: 30.0000 mL | ORAL | Status: DC | PRN

## 2012-05-20 MED ORDER — HYDROCERIN EX CREA
TOPICAL_CREAM | Freq: Two times a day (BID) | CUTANEOUS | Status: DC
  Administered 2012-05-20 – 2012-05-30 (×11): via TOPICAL
  Filled 2012-05-20: qty 113

## 2012-05-20 MED ORDER — METOPROLOL SUCCINATE ER 50 MG PO TB24
50.0000 mg | ORAL_TABLET | ORAL | Status: DC
  Administered 2012-05-21 – 2012-05-31 (×10): 50 mg via ORAL
  Filled 2012-05-20 (×12): qty 1

## 2012-05-20 MED ORDER — METOCLOPRAMIDE HCL 5 MG PO TABS
5.0000 mg | ORAL_TABLET | Freq: Four times a day (QID) | ORAL | Status: DC
  Administered 2012-05-20 – 2012-05-22 (×6): 5 mg via ORAL
  Filled 2012-05-20 (×10): qty 1

## 2012-05-20 MED ORDER — TRAMADOL HCL 50 MG PO TABS: 50.0000 mg | ORAL_TABLET | Freq: Four times a day (QID) | ORAL | Status: DC | PRN

## 2012-05-20 MED ORDER — CLOPIDOGREL BISULFATE 75 MG PO TABS
75.0000 mg | ORAL_TABLET | Freq: Every day | ORAL | Status: DC
  Administered 2012-05-21 – 2012-05-31 (×11): 75 mg via ORAL
  Filled 2012-05-20 (×13): qty 1

## 2012-05-20 NOTE — Clinical Social Work Psychosocial (Signed)
     Clinical Social Work Department BRIEF PSYCHOSOCIAL ASSESSMENT 05/20/2012  Patient:  Lindsey [name], Lindsey [name]     Account Number:  [id number]     Admit date:  05/20/2012  Clinical Social Worker:  Peggyann Shoals  Date/Time:  05/20/2012 05:30 PM  Referred by:  Physician  Date Referred:  05/20/2012 Referred for  Other - See comment   Other Referral:   Community resources.   Interview type:  Patient Other interview type:    PSYCHOSOCIAL DATA Living Status:  FAMILY Admitted from facility:   Level of care:   Primary support name:  niece Primary support relationship to patient:  FAMILY Degree of support available:   adequate    CURRENT CONCERNS Current Concerns  Adjustment to Illness  Other - See comment   Other Concerns:   Grief  Acces to [employer].    SOCIAL WORK ASSESSMENT / PLAN CSW met with pt to address cosnult for SNF. CSW introduced herself and explained role of social work.    Pt shared that she lives with her niece, and her adult children live near by. Pt reported that her brother, who lived with her passed away a little more than a week ago. Pt also stated that she has had 3 other family members pass in the last month. Pt reported that she has been compliant with her medication, however her grief symptoms have caused her a signifcant amount of stress. Pt shared she has a large family is close to them.    CSW provided supportive counseling to pt. CSW provided resources for Hospice for pt to seek grief counseling as she has suffered signifcant losses recently. Pt will discharge to CIR today. CSW is signing off as no further needs identified.   Assessment/plan status:  No Further Intervention Required Other assessment/ plan:   Information/referral to community resources:   [facility]  [place] [facility]    PATIENTS/FAMILYS RESPONSE TO PLAN OF CARE: Pt was very pleasant and thankful for resources.

## 2012-05-20 NOTE — Progress Notes (Signed)
Stroke Team Progress Note  HISTORY  Lindsey [name] is an 51 y.o. female with history of multiple recurrent ischemic and hemorrhagic strokes in the past (per note) which affected her left side (per patient) who now presented hospital with increased Left side weakness that was present on Tuesday. Weakness came on suddenly and did cause patient to fall . At present time she has no complaints of blurred vision, HA, diplopia, or paresthesia. Initial CT head was negative for stroke however F/U MRI showed acute infarction in the right internal capsule right thalamus along with small area of acute infarct in the left insula and left deep frontal white matter. Prior to admission patient was on ASA 81 mg daily. Patient has been placed on ASA 81 mg along with Aggrenox on admission. LDL 110 and A1c pending.    Patient had a hypercoagulable panel back in 2008 which was essentially normal with exception of elevated protein C, cardiolipin IgM 10. TEE at that time also was normal.    Patient was not a TPA candidate secondary to out of time window. She was admitted to the neuro floor for further evaluation and treatment.  SUBJECTIVE Her family was at the bedside.  Overall she feels her condition is stable.   OBJECTIVE Most recent Vital Signs: Filed Vitals:   05/19/12 1704 05/19/12 2131 05/20/12 0217 05/20/12 0516  BP: 129/72 125/69 117/60 121/74  Pulse: 79 65 74 77  Temp:  98.2 F (36.8 C) 98.1 F (36.7 C) 98.2 F (36.8 C)  TempSrc:  Oral Oral Oral  Resp:  17 18 18   Height:      Weight:      SpO2:  100% 100% 100%   CBG (last 3)   Basename 05/18/12 1141  GLUCAP 93    IV Fluid Intake:     MEDICATIONS    . amLODipine  10 mg Oral Daily  . aspirin EC  81 mg Oral Daily  . dipyridamole-aspirin  1 capsule Oral BID  . gabapentin  300 mg Oral QHS  . heparin  5,000 Units Subcutaneous Q8H  . hydrochlorothiazide  25 mg Oral Daily  . iron polysaccharides  150 mg Oral BID  . metoCLOPramide  5 mg Oral  QID  . metoprolol succinate  50 mg Oral BH-q7a  . potassium chloride  40 mEq Oral Once  . sertraline  50 mg Oral Daily  . simvastatin  40 mg Oral QHS  . sodium chloride  3 mL Intravenous Q12H  . traZODone  50 mg Oral QHS  . [DISCONTINUED] lisinopril  40 mg Oral Daily   PRN:  acetaminophen, acetaminophen, albuterol, labetalol, traMADol  Diet:  General thin liquids Activity:  Bedrest DVT Prophylaxis:  Heparin 5000u sq q 8hours  CLINICALLY SIGNIFICANT STUDIES Basic Metabolic Panel:  Lab 05/20/12 4098 05/19/12 0506  NA 134* 140  K 3.3* 3.9  CL 100 104  CO2 23 22  GLUCOSE 87 89  BUN 29* 18  CREATININE 2.07* 1.68*  CALCIUM 8.9 9.5  MG -- --  PHOS -- --   Liver Function Tests:  Lab 05/19/12 0506  AST 18  ALT 10  ALKPHOS 79  BILITOT 0.5  PROT 7.5  ALBUMIN 3.5   CBC:  Lab 05/20/12 0520 05/19/12 0506 05/18/12 1159  WBC 13.9* 8.1 --  NEUTROABS -- -- 5.9  HGB 10.3* 11.2* --  HCT 29.1* 32.1* --  MCV 78.9 78.7 --  PLT 281 311 --   Coagulation: No results found for this basename: LABPROT:4,INR:4  in the last 168 hours Cardiac Enzymes: No results found for this basename: CKTOTAL:3,CKMB:3,CKMBINDEX:3,TROPONINI:3 in the last 168 hours Urinalysis:  Lab 05/19/12 1538 05/18/12 1300  COLORURINE AMBER* YELLOW  LABSPEC 1.031* 1.034*  PHURINE 5.0 5.5  GLUCOSEU NEGATIVE NEGATIVE  HGBUR NEGATIVE MODERATE*  BILIRUBINUR MODERATE* SMALL*  KETONESUR 15* 15*  PROTEINUR 100* >300*  UROBILINOGEN 0.2 0.2  NITRITE NEGATIVE NEGATIVE  LEUKOCYTESUR NEGATIVE NEGATIVE   Lipid Panel    Component Value Date/Time   CHOL 170 05/19/2012 0506   TRIG 122 05/19/2012 0506   HDL 56 05/19/2012 0506   CHOLHDL 3.0 05/19/2012 0506   VLDL 24 05/19/2012 0506   LDLCALC 90 05/19/2012 0506   HgbA1C  Lab Results  Component Value Date   HGBA1C 4.9 05/19/2012    Urine Drug Screen:     Component Value Date/Time   LABOPIA NONE DETECTED 12/25/2010 0454   COCAINSCRNUR NONE DETECTED 12/25/2010 0454    LABBENZ NONE DETECTED 12/25/2010 0454   AMPHETMU NONE DETECTED 12/25/2010 0454   THCU NONE DETECTED 12/25/2010 0454   LABBARB NONE DETECTED 12/25/2010 0454    Alcohol Level: No results found for this basename: ETH:2 in the last 168 hours  Ct Head Wo Contrast 05/18/2012  No acute finding.  Extensive old infarctions throughout the brain as outlined above.  Previous suboccipital craniectomy.  Fluid in the left maxillary sinus.       Mr Brain Wo Contrast 05/19/2012   Acute infarction in the right internal capsule right thalamus. Small area of acute infarct in the left insula and left deep frontal white matter.  Extensive chronic hemorrhagic infarct throughout the anterior basal ganglia bilaterally.  Chronic infarcts in the left cerebellum.  Air-fluid level left maxillary sinus  Dg Knee Complete 4 Views Left 05/18/2012   No acute abnormality.  No effusion.   Dg Hand Complete Left 05/18/2012   No acute fracture.    2D Echocardiogram--    Carotid Doppler--   EKG  normal sinus rhythm.   Therapy Recommendations PT - ; OT - ; ST -   Physical Exam   Young african american lady not in distress.Awake alert. Afebrile. Head is nontraumatic. Neck is supple without bruit. Hearing is normal. Cardiac exam no murmur or gallop. Lungs are clear to auscultation. Distal pulses are well felt.  Neurological exam :  Awake alert oriented x 3 normal speech and language.fundi not visualized. Vision acuity and fields appear normal. Mild left lower face asymmetry. Tongue midline. No drift. Mild diminished fine finger movements on left. Orbits right over left upper extremity. Mild left grip weak.. Normal sensation . Normal coordination.    ASSESSMENT Lindsey [name] is a 51 y.o. female presenting left hemiparesis with imaging showing acute infarction in the right internal capsule right thalamus. Small area of acute infarct in the left insula and left deep frontal white matter.  Work up underway. On aspirin 81 mg  orally every day and dipyridamole SR 250 mg/aspirin 25 mg orally twice a day prior to admission. Now on aspirin 81 mg orally every day and dipyridamole SR 250 mg/aspirin 25 mg orally twice a day for secondary stroke prevention. Patient with resultant left hemiparesis.   Hypertension  Chiari I malformation  Hyperlipidemia  Migraine  Long term medication use  Elevated homocysteine levels  Hospital day # 2  TREATMENT/PLAN  Change to clopidogrel 75 mg orally every day for secondary stroke prevention.  Recommend outpatient TEE/outpatient telemetry monitoring.  Carotids, pending.   With elevated homocysteine levels,  increased risk of stroke, would start folic acid.  Recommend vasculitis workup: ANA, ESR.  Guy Franco, PAC,  MBA, MHA [name] Pager: [phone] 05/20/2012 2:18 PM  Scribe for Dr. Delia Heady, Stroke Center Medical Director. He has personally reviewed chart, pertinent data, examined the patient and developed the plan of care. Pager:  [phone]

## 2012-05-20 NOTE — Progress Notes (Signed)
Discussed pt bowels and encouraged a suppository this evening.  Pt refused but willing to take daily miralxx. Miralaxx given.

## 2012-05-20 NOTE — Progress Notes (Signed)
Called by RN patient vomited twice today after orders for transfer to CIR. I went to evaluate and she is being wheeled to new floor. She took oral zofran and tolerated thus far. She denies any headache, worsening in weakness, speech. Denies confusion or mental changes. She appears well and stable, no changes in neuro function and plans to continue with rehab.

## 2012-05-20 NOTE — Progress Notes (Signed)
Family Medicine Teaching Service Daily Progress Note Service Page: [phone]  Patient Assessment: 51 yo female with PMH of multiple recurrent ischemic and hemorrhagic strokes who presents with CC of left sided weakness found to have acute thalamic infarct on MRI.  Subjective: Pt states she is doing fine this morning. Feels that her weakness has not changed. Was able to rest last night. No complaints besides weakness.  ROS: Denies CP, SOB, N/V, fevers/chills, abdominal pain.  Neurology ordered hypercoagulable panel and recommended considering holter monitor. Otherwise, no additional recs that are different from initial plans.  PT/OT/Speech all evaluated patient and believe she would benefit from continued skilled therapy and recommend CIR.  PM&R state pt's condition is appropriate for continued rehabilitative care in CIR and pt has agreed to participate.   Objective: Temp:  [98 F (36.7 C)-98.2 F (36.8 C)] 98.2 F (36.8 C) (11/15 0516) Pulse Rate:  [60-82] 77  (11/15 0516) Resp:  [16-20] 18  (11/15 0516) BP: (117-150)/(60-128) 121/74 mmHg (11/15 0516) SpO2:  [98 %-100 %] 100 % (11/15 0516)  Exam: General: Lying in bed watching tv; NAD. HEENT: PERRL. Moist mucous membranes. Cardiovascular: S1S2, RRR. 2/6 systolic murmur heard best at left upper sternal border. No rubs or gallops heard. Respiratory: CTAB. Normal effort. Abdomen: Obese, soft. Non-distended, non-tender, no organomegaly. +BS Extremities: Moving all four limbs spontaneously. Normal ROM. No edema or erythema noted. Vascular: Distal pulses intact 2+. Skin: Warm and dry. No rashes. Neuro: Alert & oriented x 4. 4/5 strength in left deltoid, biceps, handgrip, hip flexion, 4/5 left dorsi/plantar flexion; 5/5 strength on right. Psych: Alert and oriented x4. Normal speech. Normal/appropriate mood and affect.  I have reviewed the patient's medications, labs, imaging, and diagnostic testing.  Notable results are summarized  below.  CBC BMET   Lab 05/20/12 0520 05/19/12 0506 05/18/12 1802  WBC 13.9* 8.1 11.3*  HGB 10.3* 11.2* 12.1  HCT 29.1* 32.1* 34.2*  PLT 281 311 297    Lab 05/20/12 0520 05/19/12 0506 05/18/12 1802 05/18/12 1159  NA 134* 140 -- 136  K 3.3* 3.9 -- 3.8  CL 100 104 -- 100  CO2 23 22 -- 23  BUN 29* 18 -- 14  CREATININE 2.07* 1.68* 1.11* --  GLUCOSE 87 89 -- 100*  CALCIUM 8.9 9.5 -- 9.8     Imaging/Diagnostic Tests:  MRI Brain WO Contrast (05/18/12) Impression: Acute infarction in the right internal capsule right thalamus.  Small area of acute infarct in the left insula and left deep  frontal white matter.  Extensive chronic hemorrhagic infarct throughout the anterior basal  ganglia bilaterally. Chronic infarcts in the left cerebellum.  Air-fluid level left maxillary sinus.  Assessment & Plan: 51 yo female with PMH of multiple recurrent ischemic and hemorrhagic strokes who presents with left sided weakness found to have acute thalamic infarct and chronic hemorrhage of anterior basal ganglia on MRI.  1. Thalamic Stroke  A: New stroke noted on MRI (see above). Lipid panel with LDL 90. HgA1c 4.9.  P:  - Appreciate neurology recommendations. Will need outpatient follow up with them as well.  - Echo and carotid dopplers pending  - PT/OT/Speech recommend continued therapy in CIR.  - PM&R state pt is appropriate for CIR and pt agrees; in process.  - Continue secondary prevention with BP control and statin therapy.  - Continue aggrenox and aspirin. F/u hypercoagulable workup per neuro.   2. HTN  A: Initially elevated to 190s/120s in ED. Much improved this AM 110-120s/60-70s.  P:  - Will discontinue lisinopril for now due to increasing Cr and BP control.   - Continue metoprolol, norvasc, hctz.  - Questionable compliance with home meds, given that BP has responded to initiation of home therapy in hospital.   - Continue to monitor  - Labetalol prn for SBP >190  3. AKI  A: Cr  trending up 1.01 ->1.68 -> 2.07. Baseline appears to be between 1-1.4. UA showing granular and hyaline casts. BUN/Cr ratio 14. FENa calculated at 0.06 suggesting pre-renal. Possibly attributed to relative hypotension considering pt's initial very elevated BP and now well controlled. Also potentially due to addition of home lisinopril 40mg  while here and pt has questionable compliance with meds at home.  P:  - Will d/c lisinopril for now.  - Continue to monitor   4. Leukocytosis  A: WBC 13.9 today from 8.1 yesterday. Afebrile. No si/sx of infection.  P:  - Continue to monitor   5. Hypokalemia  A: K+ 3.3 this AM down from 3.9 yesterday.  P:  - Will replace with oral K-DUR  - Continue to monitor  6. Chronic back pain  P:  - Continue home gabapentin and tramadol  7. Dispo  - Pt will be d/c'd to CIR once medically appropriate and approved.   Sharrell Ku, Medical Student  I have seen patient with MS4. Please see separate PGY-3 note from today for my full assessment and plans. Patient will be stable for discharge with f/u of her creatinine off the high dose ACEi and increase oral fluids.   Lloyd Huger, MD [name] Resident - PGY-3 05/20/2012 12:00 PM

## 2012-05-20 NOTE — Progress Notes (Signed)
Physical Therapy Treatment Patient Details Name: Lindsey [name] MRN: [id number] DOB: Aug 28, 1960 Today's Date: 05/20/2012 Time: 7253-6644 PT Time Calculation (min): 12 min  PT Assessment / Plan / Recommendation Comments on Treatment Session  51 yo s/p thalamic CVA with Lt hemiparesis. Pt with good spontaneous recovery of strength overnight and able to ambulate a short distance with +2 assist this date.    Follow Up Recommendations  CIR     Does the patient have the potential to tolerate intense rehabilitation     Barriers to Discharge        Equipment Recommendations  3 in 1 bedside comode;Tub/shower seat    Recommendations for Other Services    Frequency Min 4X/week   Plan Discharge plan remains appropriate;Frequency needs to be updated    Precautions / Restrictions Precautions Precautions: Fall   Pertinent Vitals/Pain Denied pain; + dizziness upon sitting EOB, states this is typical for her even PTA    Mobility  Bed Mobility Bed Mobility: Rolling Left;Left Sidelying to Sit;Sitting - Scoot to Edge of Bed Rolling Left: 5: Supervision;With rail;Other (comment) (pt able to grasp rail with Lt hand as well as Rt) Left Sidelying to Sit: 4: Min assist;With rails;HOB flat Sitting - Scoot to Edge of Bed: 4: Min guard Details for Bed Mobility Assistance: assist to raise torso as coming up from weaker Lt side Transfers Transfers: Sit to Stand;Stand to Sit Sit to Stand: 1: +2 Total assist;With upper extremity assist;From bed Sit to Stand: Patient Percentage: 70% Stand to Sit: 1: +2 Total assist;With upper extremity assist;With armrests;To chair/3-in-1 Stand to Sit: Patient Percentage: 50% Details for Transfer Assistance: pt favors using RLE to come to stand, shifted to the Rt; on descent, pt with lt knee buckling with rapid descent to chair Ambulation/Gait Ambulation/Gait Assistance: 1: +2 Total assist Ambulation/Gait: Patient Percentage: 60% Ambulation Distance (Feet): 6  Feet Assistive device: 2 person hand held assist Ambulation/Gait Assistance Details: Pre-gait pt able to demonstrate mini-squat and return to stand without Lt knee buckling; with gait, knee wobbly with no buckling; pt required assistance to place Lt foot 2/2 tendency to adduct as advancing; pt able to bear some weight through LUE with HHA Gait Pattern: Decreased stride length;Decreased dorsiflexion - left;Left hip hike;Shuffle;Lateral trunk lean to right;Narrow base of support;Trunk flexed;Scissoring Modified Rankin (Stroke Patients Only) Pre-Morbid Rankin Score: No symptoms Modified Rankin: Moderately severe disability    Exercises Other Exercises Other Exercises: instructed in Lt hand pumps due to edema--pt can nearly make a fist and slowly gets full finger extension   PT Diagnosis:    PT Problem List:   PT Treatment Interventions:     PT Goals Acute Rehab PT Goals Time For Goal Achievement: 06/02/12 Pt will go Supine/Side to Sit: with modified independence PT Goal: Supine/Side to Sit - Progress: Progressing toward goal Pt will Sit at [facility] of Bed: with modified independence PT Goal: Sit at Edge Of Bed - Progress: Progressing toward goal Pt will go Sit to Stand: with modified independence PT Goal: Sit to Stand - Progress: Progressing toward goal Pt will go Stand to Sit: with modified independence PT Goal: Stand to Sit - Progress: Progressing toward goal Pt will Ambulate: 51 - 150 feet;with modified independence;with least restrictive assistive device PT Goal: Ambulate - Progress: Progressing toward goal  Visit Information  Assistance Needed: +2    Subjective Data  Subjective: States she feels stronger today Patient Stated Goal: To get better   Cognition  Overall Cognitive Status: Appears within functional  limits for tasks assessed/performed Arousal/Alertness: Awake/alert Orientation Level: Appears intact for tasks assessed Behavior During Session: [facility] for tasks performed     Balance     End of Session PT - End of Session Equipment Utilized During Treatment: Gait belt Activity Tolerance: Patient tolerated treatment well Patient left: in chair;with call bell/phone within reach   GP     Rivkah Wolz 05/20/2012, 12:36 PM

## 2012-05-20 NOTE — Progress Notes (Signed)
Pt became nausea and vomited. Pt stated she felt lightheaded and dizzy. BP taken. Pt stable. Will continue to monitor.

## 2012-05-20 NOTE — Progress Notes (Signed)
FMTS Attending Daily Note:  Lindsey Don MD  [phone] pager  Family Practice pager:  [phone] I have seen and examined this patient and have reviewed their chart. I have discussed this patient with the resident. I agree with the resident's findings, assessment and care plan.  Please see other Progress notes for details.

## 2012-05-20 NOTE — Progress Notes (Signed)
Family Medicine Teaching Service Daily Progress Note Service Page: [phone]  Patient Assessment: 51 yo female with PMH of multiple recurrent ischemic and hemorrhagic strokes who presents with CC of left sided weakness found to have acute thalamic infarct on MRI.  Subjective: Pt states she is doing the same this morning. Feels that her weakness has not changed. Was able to rest last night.   She desires inpatient rehab, but would refuse SNF if she were denied. 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  ROS: Denies CP, SOB, N/V, fevers/chills, abdominal pain, dysuria, cough.  Objective: Temp:  [98 F (36.7 C)-98.2 F (36.8 C)] 98.2 F (36.8 C) (11/15 0516) Pulse Rate:  [60-79] 77  (11/15 0516) Resp:  [16-20] 18  (11/15 0516) BP: (117-144)/(60-128) 121/74 mmHg (11/15 0516) SpO2:  [98 %-100 %] 100 % (11/15 0516)  Exam: General: Lying in bed sleeping. NAD. In no acute distress. Easily awakened.  HEENT: PERRL. Moist mucous membranes. Cardiovascular: S1S2, RRR. 2/6 systolic murmur heard best at left upper sternal border. No rubs or gallops heard. Respiratory: CTAB. Normal effort. Abdomen: Obese, soft. Non-distended, non-tender, no organomegaly. +BS Extremities: No edema or erythema noted. Vascular: Distal pulses intact 2+. Skin: Warm and dry. No rashes. Neuro: Alert & oriented x 4. 4/5 strength in left deltoid, biceps, handgrip, hip flexion, 4/5 left dorsi/plantar flexion; 5/5 strength on right. Psych: alert and oriented x4. Normal speech. Normal/appropriate mood and affect.  I have reviewed the patient's medications, labs, imaging, and diagnostic testing.  Notable results are summarized below.  CBC BMET   Lab 05/20/12 0520 05/19/12 0506 05/18/12 1802  WBC 13.9* 8.1 11.3*  HGB 10.3* 11.2* 12.1  HCT 29.1* 32.1* 34.2*  PLT 281 311 297    Lab 05/20/12 0520 05/19/12 0506 05/18/12 1802 05/18/12 1159  NA 134* 140 -- 136  K 3.3* 3.9 -- 3.8  CL 100 104 -- 100  CO2 23 22  -- 23  BUN 29* 18 -- 14  CREATININE 2.07* 1.68* 1.11* --  GLUCOSE 87 89 -- 100*  CALCIUM 8.9 9.5 -- 9.8     Imaging/Diagnostic Tests:  MRI Brain WO Contrast (05/18/12) Impression: Acute infarction in the right internal capsule right thalamus.  Small area of acute infarct in the left insula and left deep  frontal white matter.  Extensive chronic hemorrhagic infarct throughout the anterior basal  ganglia bilaterally. Chronic infarcts in the left cerebellum.  Air-fluid level left maxillary sinus.  Assessment & Plan: 51 yo female with PMH of multiple recurrent ischemic and hemorrhagic strokes who presents with left sided weakness found to have acute thalamic infarct and chronic hemorrhage of anterior basal ganglia on MRI.  1. Thalamic Stroke  A: New stroke noted on MRI (see above). Lipid panel with LDL 90.   P:  - appreciate neurology recommendations. Will need outpatient follow up with them as well.  - Echo and carotid dopplers pending  - PT/OT consult pending.  - Continue secondary prevention with BP control and statin therapy.  - Continue aggrenox and aspirin. F/u hypercoagulable workup per neuro.   2. HTN  A: Initially elevated to 190s/120s in ED. This AM at 150s/60-70s.   P:  - Have discontinued lisinopril with elevation in creatinine. May restart lower dose in outpatient setting.  - She is adamant that she was taking lisinopril at home, but the new level of control may be evidence of noncompliance.   3. AKI  A: Cr increased from 1.01 to 2 this  morning. Baseline appears to be between 1-1.4. FENA indicates that prerenal etiology most likely 0.06. Possibly secondary to ACEi with relative hypoperfusion from longstanding uncontrolled HTN.  P:  - stop ACEi  -start low dose IVF 50/hr.  - Continue to monitor   4. Chronic back pain  P:  - Continue home gabapentin and tramadol  5. Leukocytosis. No obvious infectious sources or fevers.  -check PVR and urinalysis after stroke    5. Dispo  - Will order social work consult for help on an outpatient basis with consideration of Medicare Case Management.  - Anticipate possible discharge today or tomorrow pending approval for CIR.  Lloyd Huger, MD [name] Resident - PGY-3 05/20/2012 10:00 AM

## 2012-05-20 NOTE — Progress Notes (Signed)
FMTS Attending Daily Note:  Renold Don MD  [phone] pager  Family Practice pager:  [phone] I have seen and examined this patient and have reviewed their chart. I have discussed this patient with the resident. I agree with the resident's findings, assessment and care plan.  Rising creatinine noted.  Likely ACE-I use along with relative hypotension caused by anti-hypertensives.  Consistent with FeNa.  Otherwise stable for transfer to CIR.  Recommend continuing to follow BMET to trend creatinine.

## 2012-05-20 NOTE — Plan of Care (Addendum)
Overall Plan of Care [facility]) Patient Details Name: Lindsey [name] MRN: [id number] DOB: 03/14/1961  Diagnosis:  Rehab for Left Hemiparesis  Primary Diagnosis:    R subcortical infarct Co-morbidities: Morbid obesity, renal insufficiency, depression  Functional Problem List  Patient demonstrates impairments in the following areas: Balance, Bowel, Edema, Medication Management, Nutrition, Pain, Safety and Sensory   Basic ADL's: eating, grooming, bathing, dressing and toileting Advanced ADL's: simple meal preparation  Transfers:  bed mobility, bed to chair, toilet, tub/shower and car Locomotion:  ambulation, wheelchair mobility and stairs  Additional Impairments:  Functional use of upper extremity  Anticipated Outcomes Item Anticipated Outcome  Eating/Swallowing    Basic self-care   Mod I  Tolieting  Minimal assist  Bowel/Bladder  Have regular bowel movements with use of medication   Transfers    Locomotion    Communication    Cognition    Pain  3 or less  Safety/Judgment    Other     Therapy Plan:   OT Frequency: 1-2 X/day, 60-90 minutes     Team Interventions: Item RN PT OT SLP SW TR Other  Self Care/Advanced ADL Retraining   x      Neuromuscular Re-Education   x      Therapeutic Activities   x   x   UE/LE Strength Training/ROM   x      UE/LE Coordination Activities   x      Visual/Perceptual Remediation/Compensation   x      DME/Adaptive Equipment Instruction   x   x   Therapeutic Exercise   x      Balance/Vestibular Training   x      Patient/Family Education x  x   x   Cognitive Remediation/Compensation         Functional Mobility Training   x   x   Ambulation/Gait Water quality scientist Reintegration   x   x   Dysphagia/Aspiration Film/video editor         Bladder Management         Bowel  Management x        Disease Management/Prevention x        Pain Management x  x      Medication Management x        Skin Care/Wound Management x        Splinting/Orthotics         Discharge Planning   x   x   Psychosocial Support   x   x                      Team Discharge Planning: Destination:  Home Projected Follow-up:  Nursing, PT, OT and Home Health Projected Equipment Needs:  Bedside Commode, Tub Bench, Biomedical engineer involved in discharge planning:  Yes  MD ELOS: 2 wks Medical Rehab Prognosis:  Excellent Assessment: 51 yo female with R CVA now requiring 24/7 rehab RN/MD, CIR level PT/OT

## 2012-05-20 NOTE — PMR Pre-admission (Signed)
PMR Admission Coordinator Pre-Admission Assessment  Patient: Lindsey [name] is an 51 y.o., female MRN: [id number] DOB: 11-30-1960 Height: 4\' 11"  (149.9 cm) Weight: 84.868 kg (187 lb 1.6 oz)  Insurance Information HMO:      PPO:       PCP:       IPA:       80/20:       OTHER:   PRIMARY: Medicare A/B      Policy#: [id number]      Subscriber: Lezlie Lye CM Name:        Phone#:       Fax#:   Pre-Cert#:        Employer: Disabled, Not employed Benefits:  Phone #:       Name: Armed forces training and education officer. 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 Patient was independent prior to admission without the use of assistive device. Admitted 05/18/2012 after a fall and increased left-sided weakness. MRI of the brain showed acute infarct right internal capsule right thalamus and small area of acute infarct in the left insula and left deep frontal white matter. Echocardiogram and carotid Dopplers done 11/15. Patient was on aspirin prior to admission along with Aggrenox. Neurology services consulted with workup now complete. Subcutaneous heparin has been added for DVT prophylaxis. Physical and occupational therapy evaluations completed with recommendations of physical medicine rehabilitation consult  to consider inpatient rehabilitation services.   Total: 3 =NIH  Past Medical History  Past Medical History  Diagnosis Date  . Hypertension   . Chronic kidney disease   . Neuropathy   . Sleep apnea   . Chiari I malformation   . Hyperlipidemia   . CVA (cerebral infarction)   . GERD (gastroesophageal reflux disease)   . Migraine   . Daily headache     "lately" (05/18/12)  . Stroke 2008;  ?2009;     "twice; once    Family History  family history includes Heart disease in her brother, mother, and sister; Hyperlipidemia in her mother; and Hypertension in her mother.  Prior Rehab/Hospitalizations: Had therapies after CVA in 2008.  Current Medications  Current facility-administered medications:acetaminophen (TYLENOL) suppository 650 mg, 650 mg, Rectal, Q6H PRN, Lonia Skinner, MD;  acetaminophen (TYLENOL) tablet 650 mg, 650 mg, Oral, Q6H PRN, Lonia Skinner, MD;  albuterol (PROVENTIL HFA;VENTOLIN HFA) 108 (90 BASE) MCG/ACT inhaler 2 puff, 2 puff, Inhalation, Q6H PRN, Lonia Skinner, MD;  amLODipine (NORVASC) tablet 10 mg, 10 mg, Oral, Daily, Lonia Skinner, MD, 10 mg at 05/20/12 0959 aspirin EC tablet 81 mg, 81 mg, Oral, Daily, Lonia Skinner, MD, 81 mg at 05/20/12 1000;  dipyridamole-aspirin (AGGRENOX) 200-25 MG per 12 hr  capsule 1 capsule, 1 capsule, Oral, BID, Lonia Skinner, MD, 1 capsule at 05/20/12 1000;  gabapentin (NEURONTIN) capsule 300 mg, 300 mg, Oral, QHS, Lonia Skinner, MD, 300 mg at 05/19/12 2224 heparin injection 5,000 Units, 5,000 Units, Subcutaneous, Q8H, Lonia Skinner, MD, 5,000 Units at 05/20/12 1300;  hydrochlorothiazide (HYDRODIURIL) tablet 25 mg, 25 mg, Oral, Daily, Lonia Skinner, MD, 25 mg at 05/20/12 1000;  iron polysaccharides (NIFEREX) capsule 150 mg, 150 mg, Oral, BID, Lonia Skinner, MD, 150 mg at 05/20/12 0959;  metoCLOPramide (REGLAN) tablet 5 mg, 5 mg, Oral, QID, Lonia Skinner, MD, 5 mg at 05/20/12 1300 metoprolol succinate (TOPROL-XL)  24 hr tablet 50 mg, 50 mg, Oral, BH-q7a, Lonia Skinner, MD, 50 mg at 05/20/12 0600;  [COMPLETED] potassium chloride SA (K-DUR,KLOR-CON) CR tablet 40 mEq, 40 mEq, Oral, Once, Durwin Reges, MD, 40 mEq at 05/20/12 0959;  sertraline (ZOLOFT) tablet 50 mg, 50 mg, Oral, Daily, Lonia Skinner, MD, 50 mg at 05/20/12 0959;  simvastatin (ZOCOR) tablet 40 mg, 40 mg, Oral, QHS, Lonia Skinner, MD, 40 mg at 05/19/12 2224 sodium chloride 0.9 % injection 3 mL, 3 mL, Intravenous, Q12H, Lonia Skinner, MD, 3 mL at 05/20/12 0959;  traMADol (ULTRAM) tablet 50 mg, 50 mg, Oral, Q6H PRN, Lonia Skinner, MD, 50 mg at 05/18/12 2358;  traZODone (DESYREL) tablet 50 mg, 50 mg, Oral, QHS, Lonia Skinner, MD, 50 mg at 05/19/12 2251 [DISCONTINUED] 0.9 %  sodium chloride infusion, , Intravenous, Continuous, Durwin Reges, MD, Last Rate: 50 mL/hr at 05/20/12 1000, 999 mL at 05/20/12 1000;  [DISCONTINUED] labetalol (NORMODYNE,TRANDATE) injection 2.5 mg, 2.5 mg, Intravenous, Q4H PRN, Lonia Skinner, MD, 0.5 mL at 05/19/12 1615;  [DISCONTINUED] lisinopril (PRINIVIL,ZESTRIL) tablet 40 mg, 40 mg, Oral, Daily, Lonia Skinner, MD, 40 mg at 05/19/12 1126  Patients Current Diet: General  Precautions / Restrictions Precautions Precautions: Fall   Prior Activity Level Household: Goes out about 1 X a month.  Does walk the dog outside daily.  Home Assistive Devices / Equipment Home Assistive Devices/Equipment: Environmental consultant (specify type) Home Adaptive Equipment: Walker - rolling  Prior Functional Level Prior Function Level of Independence: Independent Able to Take Stairs?: Yes Driving: No Vocation: Unemployed  Current Functional Level Cognition  Arousal/Alertness: Awake/alert Overall Cognitive Status: Impaired Overall Cognitive Status: Appears within functional limits for tasks assessed/performed Orientation Level: Oriented X4 Attention: Focused;Sustained;Selective Focused Attention: Appears intact Sustained  Attention: Impaired Sustained Attention Impairment: Functional complex;Verbal complex Selective Attention: Appears intact (for basic conversation) Memory: Appears intact Awareness: Appears intact Problem Solving: Appears intact (for basic functional ADLs) Behaviors: Other (comment) (delayed processing) Safety/Judgment: Appears intact    Extremity Assessment (includes Sensation/Coordination)  RUE ROM/Strength/Tone: WFL for tasks assessed  RLE ROM/Strength/Tone: WFL for tasks assessed    ADLs  Grooming: Performed;Wash/dry face;Minimal assistance Where Assessed - Grooming: Supported sitting Upper Body Bathing: Simulated;Maximal assistance Where Assessed - Upper Body Bathing: Unsupported sitting Lower Body Bathing: Simulated;+2 Total assistance Where Assessed - Lower Body Bathing: Supported sit to stand Upper Body Dressing: Simulated;Maximal assistance Where Assessed - Upper Body Dressing: Unsupported sitting Lower Body Dressing: Simulated;+2 Total assistance Lower Body Dressing: Patient Percentage: 50% Toilet Transfer: +2 Total assistance;Performed;Other (comment) (bed to chair) Toilet Transfer: Patient Percentage: 50% Toilet Transfer Method: Sit to stand;Stand pivot Transfers/Ambulation Related to ADLs: Pt with posterior lean in sitting.  Pt able to self correct but did continue to "sway" in sitting.  Pt did report dizziness during session.  Pts LUe presents with significant weakness, but movement in all planes ofmotion    Mobility  Bed Mobility: Rolling Left;Left Sidelying to Sit;Sitting - Scoot to Edge of Bed Rolling Left: 5: Supervision;With rail;Other (comment) (pt able to grasp rail with Lt hand as well as Rt) Left Sidelying to Sit: 4: Min assist;With rails;HOB flat Supine to Sit: 2: Max assist Sitting - Scoot to [place] of Bed: 4: Min guard    Transfers  Transfers: Sit to Stand;Stand to Sit Sit to Stand: 1: +2 Total assist;With upper extremity assist;From bed Sit to Stand:  Patient Percentage: 70% Stand to Sit: 1: +2 Total assist;With upper extremity assist;With armrests;To chair/3-in-1 Stand to Sit: Patient Percentage: 50% Stand Pivot Transfers: 1: +2 Total assist Stand Pivot Transfers: Patient Percentage: 50%    Ambulation / Gait / Stairs / Wheelchair Mobility  Ambulation/Gait Ambulation/Gait Assistance: 1: +2 Total assist Ambulation/Gait: Patient Percentage: 60% Ambulation Distance (Feet): 6 Feet Assistive device: 2 person hand held assist Ambulation/Gait Assistance Details: Pre-gait pt able to demonstrate mini-squat and return to stand without Lt knee buckling; with gait, knee wobbly with no buckling; pt required assistance to place Lt foot 2/2 tendency to adduct as advancing; pt able to bear some weight through LUE with HHA Gait Pattern: Decreased stride length;Decreased dorsiflexion - left;Left hip hike;Shuffle;Lateral trunk lean to right;Narrow base of support;Trunk flexed;Scissoring General Gait Details: Pt unsteady with ambulation and unable to maintain upright without assist Stairs: No    Posture / Balance Static Sitting Balance Static Sitting - Balance Support: Feet supported;No upper extremity supported Static Sitting - Level of Assistance: 5: Stand by assistance;4: Min assist Static Sitting - Comment/# of Minutes: patient demo good trunk support but presents with posterior lean/drift with lethargy.  Pt is able to correct with cues.     Previous Home Environment Living Arrangements: Alone Lives With: Alone Available Help at Discharge: Family (per patient, neice just moved in) Type of Home: Apartment Home Layout: One level Home Access: Stairs to enter Entrance Stairs-Rails: None Entrance Stairs-Number of Steps: 1 Home Care Services: No  Discharge Living Setting Plans for Discharge Living Setting: Patient's home;Alone;House (Lived with brother, Deniece Portela, who died last week.) Type of Home at Discharge: House Discharge Home Layout: One  level Discharge Home Access: Stairs to enter Entrance Stairs-Number of Steps: 1 Do you have any problems obtaining your medications?: No  Social/Family/Support Systems Patient Roles: Parent Contact Information: Ishitha Roper - brother (h) [phone] ([phone] Anticipated Caregiver: self Ability/Limitations of Caregiver: Has 2 sons and a dtr.  2 children work and 1 in school Caregiver Availability: Intermittent Discharge Plan Discussed with Primary Caregiver: Yes Is Caregiver In Agreement with Plan?: Yes Does Caregiver/Family have Issues with Lodging/Transportation while Pt is in Rehab?: No  Goals/Additional Needs Patient/Family Goal for Rehab: PT/OT mod I goals, no ST needs identified. Expected length of stay: 2 weeks Cultural Considerations: None Dietary Needs: Regular diet Equipment Needs: TBD Pt/Family Agrees to Admission and willing to participate: Yes Program Orientation Provided & Reviewed with Pt/Caregiver Including Roles  & Responsibilities: Yes  Patient Condition: This patient's condition remains as documented in the Consult dated 05/19/12, in which the Rehabilitation Physician determined and documented that the patient's condition is appropriate for intensive rehabilitative care in an inpatient rehabilitation facility.  Preadmission Screen Completed By:  Trish Mage, 05/20/2012 1:09 PM ______________________________________________________________________   Discussed status with Dr. Riley Kill on 05/20/12 at 1315 and received telephone approval for admission today.  Admission Coordinator:  Trish Mage, time1315/Date11/15/13

## 2012-05-20 NOTE — Progress Notes (Signed)
Pt dc instructions provided to pt and daughter. Pt daughter signed dc papers. Pt daughter verbalized understanding.Pt under no s/s distress.

## 2012-05-20 NOTE — Progress Notes (Signed)
Pt arrived from 4N via wheelchair at 1630. Daughter at bedside. Reviewed rehab process and explained booklet with stroke phamplets included. Safety sheet signed with full understanding. Belongings at bedside. Call bell within reach and bed alarm on.

## 2012-05-20 NOTE — Progress Notes (Signed)
  Echocardiogram 2D Echocardiogram has been performed.  UNKNOWN, FLANNIGAN 05/20/2012, 9:52 AM

## 2012-05-20 NOTE — Progress Notes (Signed)
Pt had 3 runs of SB today. Pt verbalized she felt dizzy but nothing new.  Dizziness was her baseline. Pt BP stable. MD made aware. Pt under no s/s distress.

## 2012-05-20 NOTE — H&P (Signed)
Physical Medicine and Rehabilitation Admission H&P    Chief Complaint  Patient presents with  . Left sided weakness with fall.   : HPI: Lindsey [name] is a 51 y.o. 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 Patient was independent prior to admission without the use of assistive device. Admitted 05/18/2012 after a fall and increased left-sided weakness. MRI of the brain showed acute infarct right internal capsule right thalamus and small area of acute infarct in the left insula and left deep frontal white matter. Patient was on aspirin prior to admission and aggrenox was added for admission.  Neurology services consulted and recommends TEE/outpatient holter monitor.  Patient changed to plavix for CVA prophylaxis.  Carotid dopplers without ICA stenosis. Cardiac echo done today. Patient has has worsening of renal insufficiency and ACE d/c and IVF initiated for hydration due to rise in creatinine. Patient has had improvement in left sided weakness since last pm. Therapies ongoing and CIR recommended for progression.  Review of Systems  HENT: Negative for hearing loss.   Eyes: Negative for blurred vision and double vision.  Respiratory: Negative for shortness of breath and wheezing.   Cardiovascular: Negative for chest pain and palpitations.  Gastrointestinal: Positive for vomiting and constipation (chronic due to "IBS").  Neurological: Positive for sensory change (decreased sensation LUE/LLE) and focal weakness. Negative for headaches.   Past Medical History  Diagnosis Date  . Hypertension   . Chronic kidney disease   . Neuropathy   . Sleep apnea   . Chiari I malformation   . Hyperlipidemia   . CVA (cerebral infarction)   . GERD (gastroesophageal reflux disease)   . Migraine   . Daily headache     "lately" (05/18/12)  . Stroke 2008;   ?2009;     "twice; once   Past Surgical History  Procedure Date  . Reduction mammaplasty ~ 2002  . Cesarean section 0981; 1983; 1989  . Craniectomy suboccipital w/ cervical laminectomy / chiari 2000's  . Brain surgery    Family History  Problem Relation Age of Onset  . Hyperlipidemia Mother   . Heart disease Mother   . Hypertension Mother   . Heart disease Sister   . Heart disease Brother    Social History:  reports that she has never smoked. She has never used smokeless tobacco. She reports that she does not drink alcohol or use illicit drugs.   Allergies  Allergen Reactions  . Iodine Swelling    Tongue swells up per pt.   Medications Prior to Admission  Medication Sig Dispense Refill  . albuterol (PROVENTIL HFA;VENTOLIN HFA) 108 (90 BASE) MCG/ACT inhaler Inhale 2 puffs into the lungs every 6 (six) hours as needed. For shortness of breath      . amLODipine (NORVASC) 10 MG tablet Take 10 mg by mouth daily.        Marland Kitchen aspirin EC 81 MG tablet Take 81 mg by mouth daily.      Marland Kitchen dipyridamole-aspirin (AGGRENOX) 25-200 MG per 12 hr capsule Take 1 capsule by mouth 2 (two) times daily.        Marland Kitchen gabapentin (NEURONTIN) 100 MG capsule Take 300 mg by mouth at bedtime.      . hydrochlorothiazide (HYDRODIURIL) 25 MG tablet Take 25 mg by mouth daily.        . iron polysaccharides (NIFEREX) 150 MG capsule Take 150  mg by mouth 2 (two) times daily.        Marland Kitchen lisinopril (PRINIVIL,ZESTRIL) 20 MG tablet Take 40 mg by mouth daily.        . metoCLOPramide (REGLAN) 10 MG tablet Take 5 mg by mouth 4 (four) times daily. take one 30 min before meals and at bedtime       . metoprolol (TOPROL-XL) 50 MG 24 hr tablet Take 50 mg by mouth every morning.        . sertraline (ZOLOFT) 50 MG tablet Take 50 mg by mouth daily.        . simvastatin (ZOCOR) 40 MG tablet Take 40 mg by mouth at bedtime.        . traMADol (ULTRAM) 50 MG tablet Take 50 mg by mouth every 6 (six) hours as needed. For pain      . traZODone  (DESYREL) 50 MG tablet Take 50 mg by mouth at bedtime.        Marland Kitchen acetaminophen (TYLENOL) 500 MG tablet Take 500 mg by mouth every 6 (six) hours as needed. For pain         Home: Home Living Lives With: Alone Available Help at Discharge: Family (per patient, neice just moved in) Type of Home: Apartment Home Access: Stairs to enter Entrance Stairs-Number of Steps: 1 Entrance Stairs-Rails: None Home Layout: One level Home Adaptive Equipment: Walker - rolling   Functional History: Prior Function Able to Take Stairs?: Yes Driving: No Vocation: Unemployed  Functional Status:  Mobility: Bed Mobility Bed Mobility: Supine to Sit Supine to Sit: 2: Max assist Transfers Transfers: Sit to Stand;Stand to [employer] Transfers Sit to Stand: From chair/3-in-1;1: +2 Total assist Sit to Stand: Patient Percentage: 50% Stand to Sit: To chair/3-in-1;1: +2 Total assist Stand to Sit: Patient Percentage: 50% Stand Pivot Transfers: 1: +2 Total assist Stand Pivot Transfers: Patient Percentage: 50% Ambulation/Gait Ambulation/Gait Assistance: 1: +2 Total assist Ambulation/Gait: Patient Percentage: 50% Ambulation Distance (Feet): 3 Feet Assistive device: Rolling walker Ambulation/Gait Assistance Details: Manual Assist for upright and hand positioning on rw; manual assist for LE advancement Gait Pattern: Decreased stride length;Decreased dorsiflexion - left;Left hip hike;Shuffle;Lateral trunk lean to right;Narrow base of support;Trunk flexed;Scissoring General Gait Details: Pt unsteady with ambulation and unable to maintain upright without assist Stairs: No    ADL: ADL Grooming: Performed;Wash/dry face;Minimal assistance Where Assessed - Grooming: Supported sitting Upper Body Bathing: Simulated;Maximal assistance Where Assessed - Upper Body Bathing: Unsupported sitting Lower Body Bathing: Simulated;+2 Total assistance Where Assessed - Lower Body Bathing: Supported sit to stand Upper Body  Dressing: Simulated;Maximal assistance Where Assessed - Upper Body Dressing: Unsupported sitting Lower Body Dressing: Simulated;+2 Total assistance Toilet Transfer: +2 Total assistance;Performed;Other (comment) (bed to chair) Toilet Transfer Method: Sit to stand;Stand pivot Transfers/Ambulation Related to ADLs: Pt with posterior lean in sitting.  Pt able to self correct but did continue to "sway" in sitting.  Pt did report dizziness during session.  Pts LUe presents with significant weakness, but movement in all planes ofmotion  Cognition: Cognition Overall Cognitive Status: Impaired Arousal/Alertness: Awake/alert Orientation Level: Oriented X4 Attention: Focused;Sustained;Selective Focused Attention: Appears intact Sustained Attention: Impaired Sustained Attention Impairment: Functional complex;Verbal complex Selective Attention: Appears intact (for basic conversation) Memory: Appears intact Awareness: Appears intact Problem Solving: Appears intact (for basic functional ADLs) Behaviors: Other (comment) (delayed processing) Safety/Judgment: Appears intact Cognition Overall Cognitive Status: Appears within functional limits for tasks assessed/performed Arousal/Alertness: Awake/alert Orientation Level: Appears intact for tasks assessed Behavior During Session: Lethargic  Blood pressure 121/74, pulse 77, temperature 98.2 F (36.8 C), temperature source Oral, resp. rate 18, height 4\' 11"  (1.499 m), weight 84.868 kg (187 lb 1.6 oz), last menstrual period 05/11/2012, SpO2 100.00%. Physical Exam  Nursing note and vitals reviewed. Constitutional: She is oriented to person, place, and time. She appears well-developed and well-nourished.       Morbidly obese female, malaise due to recent vomiting episode.   HENT:  Head: Normocephalic and atraumatic.  Eyes: Pupils are equal, round, and reactive to light.  Neck: Normal range of motion.  Cardiovascular: Normal rate and regular rhythm.     Pulmonary/Chest: Effort normal and breath sounds normal. No respiratory distress. She has no wheezes.  Abdominal: Soft. Bowel sounds are normal.  Musculoskeletal: She exhibits no edema.  Neurological: She is alert and oriented to person, place, and time.       Speech flat but without dysarthria. Follows basic commands without difficulty. Left sided weakness with ataxia LUE.   Skin: Skin is warm and dry.    Results for orders placed during the hospital encounter of 05/19/12   URINALYSIS, ROUTINE W REFLEX MICROSCOPIC     Status: Abnormal   Collection Time   05/19/12  3:38 PM      Component Value Range Comment   Color, Urine AMBER (*) YELLOW BIOCHEMICALS MAY BE AFFECTED BY COLOR   APPearance CLOUDY (*) CLEAR    Specific Gravity, Urine 1.031 (*) 1.005 - 1.030    pH 5.0  5.0 - 8.0    Glucose, UA NEGATIVE  NEGATIVE mg/dL    Hgb urine dipstick NEGATIVE  NEGATIVE    Bilirubin Urine MODERATE (*) NEGATIVE    Ketones, ur 15 (*) NEGATIVE mg/dL   URINE MICROSCOPIC-ADD ON     Status: Abnormal   Collection Time   05/19/12  3:38 PM      Component Value Range Comment   Squamous Epithelial / LPF MANY (*) RARE    WBC, UA 0-2  <3 WBC/hpf    RBC / HPF 0-2  <3 RBC/hpf    Bacteria, UA FEW (*) RARE    Casts HYALINE CASTS (*) NEGATIVE GRANULAR CAST  BASIC METABOLIC PANEL     Status: Abnormal   Collection Time   05/20/12  5:20 AM      Component Value Range Comment   Sodium 134 (*) 135 - 145 mEq/L    Potassium 3.3 (*) 3.5 - 5.1 mEq/L    Chloride 100  96 - 112 mEq/L    CO2 23  19 - 32 mEq/L    Glucose, Bld 87  70 - 99 mg/dL    BUN 29 (*) 6 - 23 mg/dL DELTA CHECK NOTED   Creatinine, Ser 2.07 (*) 0.50 - 1.10 mg/dL    Calcium 8.9  8.4 - 16.1 mg/dL    GFR calc non Af Amer 27 (*) >90 mL/min    GFR calc Af Amer 31 (*) >90 mL/min   CBC     Status: Abnormal   Collection Time   05/20/12  5:20 AM      Component Value Range Comment   WBC 13.9 (*) 4.0 - 10.5 K/uL    RBC 3.69 (*) 3.87 - 5.11 MIL/uL     Hemoglobin 10.3 (*) 12.0 - 15.0 g/dL    HCT 09.6 (*) 04.5 - 46.0 %    MCV 78.9  78.0 - 100.0 fL    MCH 27.9  26.0 - 34.0 pg    MCHC 35.4  30.0 -  36.0 g/dL    RDW 40.9 (*) 81.1 - 15.5 %    Platelets 281  150 - 400 K/uL    Mr Brain Wo Contrast  05/19/2012  *RADIOLOGY REPORT*  Clinical Data: Left-sided weakness.  Rule out stroke.  Headache and dizziness.  MRI HEAD WITHOUT CONTRAST  IMPRESSION: Acute infarction in the right internal capsule right thalamus. Small area of acute infarct in the left insula and left deep frontal white matter.  Extensive chronic hemorrhagic infarct throughout the anterior basal ganglia bilaterally.  Chronic infarcts in the left cerebellum.  Air-fluid level left maxillary sinus.   Original Report Authenticated By: Janeece Riggers, M.D.    Dg Knee Complete 4 Views Left 05/18/2012    IMPRESSION: No acute abnormality.  No effusion.   Original Report Authenticated By: Dwyane Dee, M.D.    Dg Hand Complete Left 05/18/2012  *RADIOLOGY REPORT*  Clinical Data: Larey Seat on the left hand with pain  LEFT HAND - COMPLETE 3+ VIEW  Comparison: None.  Findings: No acute fracture is seen.  Alignment is normal.  There is a lucency through the base of the second metatarsal but this most likely represents a Mach effect, and is not the area where the patient is complaining of pain.  The carpal bones are in normal position and the radiocarpal joint space appears normal.  IMPRESSION: No acute fracture.   Original Report Authenticated By: Dwyane Dee, M.D.     Post Admission Physician Evaluation: 1. Functional deficits secondary  to right internal capsule, thalamus and left insula and deep frontal white matter infarcts, likely thrombotic from small vessel disease. 2. Patient is admitted to receive collaborative, interdisciplinary care between the physiatrist, rehab nursing staff, and therapy team. 3. Patient's level of medical complexity and substantial therapy needs in context of that medical necessity  cannot be provided at a lesser intensity of care such as a SNF. 4. Patient has experienced substantial functional loss from his/her baseline which was documented above under the "Functional History" and "Functional Status" headings.  Judging by the patient's diagnosis, physical exam, and functional history, the patient has potential for functional progress which will result in measurable gains while on inpatient rehab.  These gains will be of substantial and practical use upon discharge  in facilitating mobility and self-care at the household level. 5. Physiatrist will provide 24 hour management of medical needs as well as oversight of the therapy plan/treatment and provide guidance as appropriate regarding the interaction of the two. 6. 24 hour rehab nursing will assist with bladder management, bowel management, safety, skin/wound care, disease management, medication administration, pain management and patient education  and help integrate therapy concepts, techniques,education, etc. 7. PT will assess and treat for:  fxn mobility, NMR, safety, balance, adaptive equipment, .  Goals are: supervision to min assist. 8. OT will assess and treat for: UES, ROM, safety, fxnl mobility, ADL's, adaptive equipment.   Goals are: mod I to min assist. 9. SLP will assess and treat for: speech, communication, .  Goals are: mod I. 10. Case Management and Social Worker will assess and treat for psychological issues and discharge planning. 11. Team conference will be held weekly to assess progress toward goals and to determine barriers to discharge. 12. Patient will receive at least 3 hours of therapy per day at least 5 days per week. 13. ELOS: 2-3 weeks      Prognosis:  excellent   Medical Problem List and Plan: 1. DVT Prophylaxis/Anticoagulation: Pharmaceutical: Lovenox 2. Pain Management: N/C 3.  Mood: Continue Zoloft 50mg .  Flat affect noted. Will monitor for now and have LCSW follow for formal evaluation. 4.  Neuropsych: This patient is capable of making decisions on his/her own behalf. 5. HTN: monitor with bid checks.  Continue Norvasc, HCTZ and metoprolol. Monitor hydration status on diuretics.  6. Migraine: continue tramadol for chronic headaches. 7: Hyperlipidemia:  On lipitor daily. 8. CKD: Off ace due to worsening of renal status. Will hydrate with IVF and recheck labs in am. 9. Constipation: Had N/V this pm. Continue Reglan. Start miralax daily and titrate as needed to help with symptoms.     05/20/2012, Ivory Broad, MD

## 2012-05-20 NOTE — H&P (Signed)
Physical Medicine and Rehabilitation Admission H&P  Chief Complaint   Patient presents with   .  Left sided weakness with fall.   :  HPI: Lindsey [name] is a 51 y.o. 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 Patient was independent prior to admission without the use of assistive device. Admitted 05/18/2012 after a fall and increased left-sided weakness. MRI of the brain showed acute infarct right internal capsule right thalamus and small area of acute infarct in the left insula and left deep frontal white matter. Patient was on aspirin prior to admission and aggrenox was added for admission. Neurology services consulted and recommends TEE/outpatient holter monitor. Patient changed to plavix for CVA prophylaxis. Carotid dopplers without ICA stenosis. Cardiac echo done today. Patient has has worsening of renal insufficiency and ACE d/c and IVF initiated for hydration due to rise in creatinine. Patient has had improvement in left sided weakness since last pm. Therapies ongoing and CIR recommended for progression.  Review of Systems  HENT: Negative for hearing loss.  Eyes: Negative for blurred vision and double vision.  Respiratory: Negative for shortness of breath and wheezing.  Cardiovascular: Negative for chest pain and palpitations.  Gastrointestinal: Positive for vomiting and constipation (chronic due to "IBS").  Neurological: Positive for sensory change (decreased sensation LUE/LLE) and focal weakness. Negative for headaches.   Past Medical History   Diagnosis  Date   .  Hypertension    .  Chronic kidney disease    .  Neuropathy    .  Sleep apnea    .  Chiari I malformation    .  Hyperlipidemia    .  CVA (cerebral infarction)    .  GERD (gastroesophageal reflux disease)    .  Migraine    .  Daily headache      "lately" (05/18/12)   .   Stroke  2008; ?2009;     "twice; once    Past Surgical History   Procedure  Date   .  Reduction mammaplasty  ~ 2002   .  Cesarean section  1610; 1983; 1989   .  Craniectomy suboccipital w/ cervical laminectomy / chiari  2000's   .  Brain surgery     Family History   Problem  Relation  Age of Onset   .  Hyperlipidemia  Mother    .  Heart disease  Mother    .  Hypertension  Mother    .  Heart disease  Sister    .  Heart disease  Brother     Social History: reports that she has never smoked. She has never used smokeless tobacco. She reports that she does not drink alcohol or use illicit drugs.  Allergies   Allergen  Reactions   .  Iodine  Swelling     Tongue swells up per pt.    Medications Prior to Admission   Medication  Sig  Dispense  Refill   .  albuterol (PROVENTIL HFA;VENTOLIN HFA) 108 (90 BASE) MCG/ACT inhaler  Inhale 2 puffs into the lungs every 6 (six) hours as needed. For shortness of breath     .  amLODipine (NORVASC) 10 MG tablet  Take 10 mg by mouth daily.     Marland Kitchen  aspirin EC 81 MG tablet  Take 81 mg by mouth daily.     Marland Kitchen  dipyridamole-aspirin (AGGRENOX)  25-200 MG per 12 hr capsule  Take 1 capsule by mouth 2 (two) times daily.     Marland Kitchen  gabapentin (NEURONTIN) 100 MG capsule  Take 300 mg by mouth at bedtime.     .  hydrochlorothiazide (HYDRODIURIL) 25 MG tablet  Take 25 mg by mouth daily.     .  iron polysaccharides (NIFEREX) 150 MG capsule  Take 150 mg by mouth 2 (two) times daily.     Marland Kitchen  lisinopril (PRINIVIL,ZESTRIL) 20 MG tablet  Take 40 mg by mouth daily.     .  metoCLOPramide (REGLAN) 10 MG tablet  Take 5 mg by mouth 4 (four) times daily. take one 30 min before meals and at bedtime     .  metoprolol (TOPROL-XL) 50 MG 24 hr tablet  Take 50 mg by mouth every morning.     .  sertraline (ZOLOFT) 50 MG tablet  Take 50 mg by mouth daily.     .  simvastatin (ZOCOR) 40 MG tablet  Take 40 mg by mouth at bedtime.     .  traMADol (ULTRAM) 50 MG tablet  Take 50 mg by mouth every 6  (six) hours as needed. For pain     .  traZODone (DESYREL) 50 MG tablet  Take 50 mg by mouth at bedtime.     Marland Kitchen  acetaminophen (TYLENOL) 500 MG tablet  Take 500 mg by mouth every 6 (six) hours as needed. For pain      Home:  Home Living  Lives With: Alone  Available Help at Discharge: Family (per patient, neice just moved in)  Type of Home: Apartment  Home Access: Stairs to enter  Entrance Stairs-Number of Steps: 1  Entrance Stairs-Rails: None  Home Layout: One level  Home Adaptive Equipment: Walker - rolling  Functional History:  Prior Function  Able to Take Stairs?: Yes  Driving: No  Vocation: Unemployed  Functional Status:  Mobility:  Bed Mobility  Bed Mobility: Supine to Sit  Supine to Sit: 2: Max assist  Transfers  Transfers: Sit to Stand;Stand to [employer] Transfers  Sit to Stand: From chair/3-in-1;1: +2 Total assist  Sit to Stand: Patient Percentage: 50%  Stand to Sit: To chair/3-in-1;1: +2 Total assist  Stand to Sit: Patient Percentage: 50%  Stand Pivot Transfers: 1: +2 Total assist  Stand Pivot Transfers: Patient Percentage: 50%  Ambulation/Gait  Ambulation/Gait Assistance: 1: +2 Total assist  Ambulation/Gait: Patient Percentage: 50%  Ambulation Distance (Feet): 3 Feet  Assistive device: Rolling walker  Ambulation/Gait Assistance Details: Manual Assist for upright and hand positioning on rw; manual assist for LE advancement  Gait Pattern: Decreased stride length;Decreased dorsiflexion - left;Left hip hike;Shuffle;Lateral trunk lean to right;Narrow base of support;Trunk flexed;Scissoring  General Gait Details: Pt unsteady with ambulation and unable to maintain upright without assist  Stairs: No   ADL:  ADL  Grooming: Performed;Wash/dry face;Minimal assistance  Where Assessed - Grooming: Supported sitting  Upper Body Bathing: Simulated;Maximal assistance  Where Assessed - Upper Body Bathing: Unsupported sitting  Lower Body Bathing: Simulated;+2 Total  assistance  Where Assessed - Lower Body Bathing: Supported sit to stand  Upper Body Dressing: Simulated;Maximal assistance  Where Assessed - Upper Body Dressing: Unsupported sitting  Lower Body Dressing: Simulated;+2 Total assistance  Toilet Transfer: +2 Total assistance;Performed;Other (comment) (bed to chair)  Toilet Transfer Method: Sit to stand;Stand pivot  Transfers/Ambulation Related to ADLs: Pt with posterior lean in sitting. Pt able to self correct but did continue to "sway" in sitting.  Pt did report dizziness during session. Pts LUe presents with significant weakness, but movement in all planes ofmotion  Cognition:  Cognition  Overall Cognitive Status: Impaired  Arousal/Alertness: Awake/alert  Orientation Level: Oriented X4  Attention: Focused;Sustained;Selective  Focused Attention: Appears intact  Sustained Attention: Impaired  Sustained Attention Impairment: Functional complex;Verbal complex  Selective Attention: Appears intact (for basic conversation)  Memory: Appears intact  Awareness: Appears intact  Problem Solving: Appears intact (for basic functional ADLs)  Behaviors: Other (comment) (delayed processing)  Safety/Judgment: Appears intact  Cognition  Overall Cognitive Status: Appears within functional limits for tasks assessed/performed  Arousal/Alertness: Awake/alert  Orientation Level: Appears intact for tasks assessed  Behavior During Session: Lethargic  Blood pressure 121/74, pulse 77, temperature 98.2 F (36.8 C), temperature source Oral, resp. rate 18, height 4\' 11"  (1.499 m), weight 84.868 kg (187 lb 1.6 oz), last menstrual period 05/11/2012, SpO2 100.00%.  Physical Exam  Nursing note and vitals reviewed.  Constitutional: She is oriented to person, place, and time. She appears well-developed and well-nourished.  Morbidly obese female, malaise due to recent vomiting episode.  HENT:  Head: Normocephalic and atraumatic.  Eyes: Pupils are equal, round, and  reactive to light.  Neck: Normal range of motion.  Cardiovascular: Normal rate and regular rhythm.  Pulmonary/Chest: Effort normal and breath sounds normal. No respiratory distress. She has no wheezes.  Abdominal: Soft. Bowel sounds are normal.  Musculoskeletal: She exhibits no edema.  Neurological: She is alert and oriented to person, place, and time.  Speech flat but without dysarthria. Follows basic commands without difficulty. Left sided weakness with ataxia LUE.  LUE is grossly 3 proximal to 2-3/5 distally. LLE is grossly 3- proximally to 3 distally sensation is 1/2 Left side. Left facial droop, tongue deviation and sensory loss. Cognitively displays reasonable insight and awareness.  Skin: Skin is warm and dry.   Results for orders placed during the hospital encounter of 05/19/12   URINALYSIS, ROUTINE W REFLEX MICROSCOPIC Status: Abnormal    Collection Time    05/19/12 3:38 PM   Component  Value  Range  Comment    Color, Urine  AMBER (*)  YELLOW  BIOCHEMICALS MAY BE AFFECTED BY COLOR    APPearance  CLOUDY (*)  CLEAR     Specific Gravity, Urine  1.031 (*)  1.005 - 1.030     pH  5.0  5.0 - 8.0     Glucose, UA  NEGATIVE  NEGATIVE mg/dL     Hgb urine dipstick  NEGATIVE  NEGATIVE     Bilirubin Urine  MODERATE (*)  NEGATIVE     Ketones, ur  15 (*)  NEGATIVE mg/dL    URINE MICROSCOPIC-ADD ON Status: Abnormal    Collection Time    05/19/12 3:38 PM   Component  Value  Range  Comment    Squamous Epithelial / LPF  MANY (*)  RARE     WBC, UA  0-2  <3 WBC/hpf     RBC / HPF  0-2  <3 RBC/hpf     Bacteria, UA  FEW (*)  RARE     Casts  HYALINE CASTS (*)  NEGATIVE  GRANULAR CAST   BASIC METABOLIC PANEL Status: Abnormal    Collection Time    05/20/12 5:20 AM   Component  Value  Range  Comment    Sodium  134 (*)  135 - 145 mEq/L     Potassium  3.3 (*)  3.5 - 5.1 mEq/L     Chloride  100  96 - 112 mEq/L     CO2  23  19 - 32 mEq/L     Glucose, Bld  87  70 - 99 mg/dL     BUN  29 (*)  6 - 23  mg/dL  DELTA CHECK NOTED    Creatinine, Ser  2.07 (*)  0.50 - 1.10 mg/dL     Calcium  8.9  8.4 - 10.5 mg/dL     GFR calc non Af Amer  27 (*)  >90 mL/min     GFR calc Af Amer  31 (*)  >90 mL/min    CBC Status: Abnormal    Collection Time    05/20/12 5:20 AM   Component  Value  Range  Comment    WBC  13.9 (*)  4.0 - 10.5 K/uL     RBC  3.69 (*)  3.87 - 5.11 MIL/uL     Hemoglobin  10.3 (*)  12.0 - 15.0 g/dL     HCT  16.1 (*)  09.6 - 46.0 %     MCV  78.9  78.0 - 100.0 fL     MCH  27.9  26.0 - 34.0 pg     MCHC  35.4  30.0 - 36.0 g/dL     RDW  04.5 (*)  40.9 - 15.5 %     Platelets  281  150 - 400 K/uL    Mr Brain Wo Contrast  05/19/2012 *RADIOLOGY REPORT* Clinical Data: Left-sided weakness. Rule out stroke. Headache and dizziness. MRI HEAD WITHOUT CONTRAST IMPRESSION: Acute infarction in the right internal capsule right thalamus. Small area of acute infarct in the left insula and left deep frontal white matter. Extensive chronic hemorrhagic infarct throughout the anterior basal ganglia bilaterally. Chronic infarcts in the left cerebellum. Air-fluid level left maxillary sinus. Original Report Authenticated By: Janeece Riggers, M.D.  Dg Knee Complete 4 Views Left  05/18/2012 IMPRESSION: No acute abnormality. No effusion. Original Report Authenticated By: Dwyane Dee, M.D.  Dg Hand Complete Left  05/18/2012 *RADIOLOGY REPORT* Clinical Data: Larey Seat on the left hand with pain LEFT HAND - COMPLETE 3+ VIEW Comparison: None. Findings: No acute fracture is seen. Alignment is normal. There is a lucency through the base of the second metatarsal but this most likely represents a Mach effect, and is not the area where the patient is complaining of pain. The carpal bones are in normal position and the radiocarpal joint space appears normal. IMPRESSION: No acute fracture. Original Report Authenticated By: Dwyane Dee, M.D.  Post Admission Physician Evaluation:  1. Functional deficits secondary to right internal capsule,  thalamus and left insula and deep frontal white matter infarcts, likely thrombotic from small vessel disease. 2. Patient is admitted to receive collaborative, interdisciplinary care between the physiatrist, rehab nursing staff, and therapy team. 3. Patient's level of medical complexity and substantial therapy needs in context of that medical necessity cannot be provided at a lesser intensity of care such as a SNF. 4. Patient has experienced substantial functional loss from his/her baseline which was documented above under the "Functional History" and "Functional Status" headings. Judging by the patient's diagnosis, physical exam, and functional history, the patient has potential for functional progress which will result in measurable gains while on inpatient rehab. These gains will be of substantial and practical use upon discharge in facilitating mobility and self-care at the household level. 5. Physiatrist will provide 24 hour management of medical needs as well as oversight of the therapy plan/treatment and provide guidance  as appropriate regarding the interaction of the two. 6. 24 hour rehab nursing will assist with bladder management, bowel management, safety, skin/wound care, disease management, medication administration, pain management and patient education and help integrate therapy concepts, techniques,education, etc. 7. PT will assess and treat for: fxn mobility, NMR, safety, balance, adaptive equipment, . Goals are: supervision to min assist. 8. OT will assess and treat for: UES, ROM, safety, fxnl mobility, ADL's, adaptive equipment. Goals are: mod I to min assist. 9. SLP will assess and treat for: speech, communication, . Goals are: mod I. 10. Case Management and Social Worker will assess and treat for psychological issues and discharge planning. 11. Team conference will be held weekly to assess progress toward goals and to determine barriers to discharge. 12. Patient will receive at least 3  hours of therapy per day at least 5 days per week. 13. ELOS: 2-3 weeks Prognosis: excellent Medical Problem List and Plan:  1. DVT Prophylaxis/Anticoagulation: Pharmaceutical: Lovenox  2. Pain Management: N/C  3. Mood: Continue Zoloft 50mg . Flat affect noted. Will monitor for now and have LCSW follow for formal evaluation.  4. Neuropsych: This patient is capable of making decisions on his/her own behalf.  5. HTN: monitor with bid checks. Continue Norvasc, HCTZ and metoprolol. Monitor hydration status on diuretics.  6. Migraine: continue tramadol for chronic headaches.  7: Hyperlipidemia: On lipitor daily.  8. CKD: Off ace due to worsening of renal status. Will hydrate with IVF and recheck labs in am.  9. Constipation: Had N/V this pm. Continue Reglan. Start miralax daily and titrate as needed to help with symptoms.  05/20/2012, Ivory Broad, MD

## 2012-05-20 NOTE — Progress Notes (Signed)
VASCULAR LAB PRELIMINARY  PRELIMINARY  PRELIMINARY  PRELIMINARY  Carotid duplex  completed.    Preliminary report:  Bilateral:  No evidence of hemodynamically significant internal carotid artery stenosis.   Vertebral artery flow is antegrade.      Rande Dario, RVT 05/20/2012, 11:18 AM

## 2012-05-20 NOTE — Progress Notes (Signed)
Rehab admissions - Evaluated for possible admission.  I spoke with patient who would like inpatient rehab here at [facility] prior to home.  Awaiting MD call back to see if patient ready for rehab today.  Bed available and can admit to inpatient rehab today if MD says okay.  Call me for questions.  #[phone]

## 2012-05-21 ENCOUNTER — Inpatient Hospital Stay (HOSPITAL_COMMUNITY): Payer: Medicare Other | Admitting: Occupational Therapy

## 2012-05-21 ENCOUNTER — Inpatient Hospital Stay (HOSPITAL_COMMUNITY): Payer: Medicare Other | Admitting: *Deleted

## 2012-05-21 DIAGNOSIS — I6330 Cerebral infarction due to thrombosis of unspecified cerebral artery: Secondary | ICD-10-CM

## 2012-05-21 NOTE — Progress Notes (Signed)
Physical Therapy Session Note  Patient Details  Name: Lindsey [name] MRN: [id number] Date of Birth: 12-Apr-1961  Today's Date: 05/21/2012  Time: 15:00-15:30 ( )   Skilled Therapeutic Interventions/Progress Updates:  Pt significantly fatigued from today's therapies, but agreeable to participate.  Sit<>stand with Min A x3 for steadying and cues for safe hand placement. Performed gait training in room in tight spaces around obstacles 3x12' with Mod A for steadying.  Toilet transfer with Min A for steadying. Unsupported sitting x5 min with no LOB.  Seated NMR for LLE including sustained contractions during marching, LAQ, and ankle pumps 10x10 sec holds each with cues for technique.     Therapy Documentation Precautions:  Precautions Precautions: Fall Precaution Comments: L hemiparesis, h/o "vertigo" disorder per pt Restrictions Weight Bearing Restrictions: No General: Chart Reviewed: Yes Additional Pertinent History: Per pt, this is her 6th CVA Family/Caregiver Present: Yes Pain: None  See FIM for current functional status  Therapy/Group: Individual Therapy  Virl Cagey, PT 05/21/2012, 3:37 PM

## 2012-05-21 NOTE — Discharge Summary (Signed)
Family Medicine Teaching Service  Discharge Note : Attending Jeff Walden MD Pager [phone] Inpatient Team Pager:  [phone]  I have seen and examined this patient, reviewed their chart and discussed discharge planning with the resident at the time of discharge. I agree with the discharge plan as above.  

## 2012-05-21 NOTE — Progress Notes (Signed)
Patient ID: Lindsey [name], female   DOB: 05/28/1961, 51 y.o.   MRN: [id number] 51 y.o. 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 Patient was independent prior to admission without the use of assistive device. Admitted 05/18/2012 after a fall and increased left-sided weakness. MRI of the brain showed acute infarct right internal capsule right thalamus and small area of acute infarct in the left insula and left deep frontal white matter. Patient was on aspirin prior to admission and aggrenox was added  Subjective/Complaints: Did well with OT who has set S/Mod I goals.  Daughter works but would like pt to go home with her.  Pt lives alone and would like to return to her house Nausea and vomiting resolved  Daughter thinks mom is depressed Objective: Vital Signs: Blood pressure 136/82, pulse 65, temperature 98.4 F (36.9 C), temperature source Oral, resp. rate 18, height 4\' 11"  (1.499 m), weight 80.1 kg (176 lb 9.4 oz), last menstrual period 05/11/2012, SpO2 95.00%. No results found. Results for orders placed during the hospital encounter of 05/18/12 (from the past 72 hour(s))  GLUCOSE, CAPILLARY     Status: Normal   Collection Time   05/18/12 11:41 AM      Component Value Range Comment   Glucose-Capillary 93  70 - 99 mg/dL    Comment 1 Documented in Chart      Comment 2 Notify RN     CBC WITH DIFFERENTIAL     Status: Abnormal   Collection Time   05/18/12 11:59 AM      Component Value Range Comment   WBC 9.1  4.0 - 10.5 K/uL    RBC 4.53  3.87 - 5.11 MIL/uL    Hemoglobin 12.6  12.0 - 15.0 g/dL    HCT 40.9  81.1 - 91.4 %    MCV 79.5  78.0 - 100.0 fL    MCH 27.8  26.0 - 34.0 pg    MCHC 35.0  30.0 - 36.0 g/dL    RDW 78.2 (*) 95.6 - 15.5 %    Platelets 322  150 - 400 K/uL    Neutrophils Relative 65  43 - 77 %    Neutro Abs 5.9  1.7 - 7.7  K/uL    Lymphocytes Relative 30  12 - 46 %    Lymphs Abs 2.7  0.7 - 4.0 K/uL    Monocytes Relative 5  3 - 12 %    Monocytes Absolute 0.5  0.1 - 1.0 K/uL    Eosinophils Relative 0  0 - 5 %    Eosinophils Absolute 0.0  0.0 - 0.7 K/uL    Basophils Relative 0  0 - 1 %    Basophils Absolute 0.0  0.0 - 0.1 K/uL   BASIC METABOLIC PANEL     Status: Abnormal   Collection Time   05/18/12 11:59 AM      Component Value Range Comment   Sodium 136  135 - 145 mEq/L    Potassium 3.8  3.5 - 5.1 mEq/L    Chloride 100  96 - 112 mEq/L    CO2 23  19 - 32 mEq/L    Glucose, Bld 100 (*) 70 - 99 mg/dL    BUN 14  6 - 23 mg/dL    Creatinine, Ser 2.13  0.50 - 1.10 mg/dL    Calcium 9.8  8.4 - 08.6  mg/dL    GFR calc non Af Amer 64 (*) >90 mL/min    GFR calc Af Amer 74 (*) >90 mL/min   POCT I-STAT TROPONIN I     Status: Normal   Collection Time   05/18/12 12:10 PM      Component Value Range Comment   Troponin i, poc 0.00  0.00 - 0.08 ng/mL    Comment 3            URINALYSIS, ROUTINE W REFLEX MICROSCOPIC     Status: Abnormal   Collection Time   05/18/12  1:00 PM      Component Value Range Comment   Color, Urine YELLOW  YELLOW    APPearance HAZY (*) CLEAR    Specific Gravity, Urine 1.034 (*) 1.005 - 1.030    pH 5.5  5.0 - 8.0    Glucose, UA NEGATIVE  NEGATIVE mg/dL    Hgb urine dipstick MODERATE (*) NEGATIVE    Bilirubin Urine SMALL (*) NEGATIVE    Ketones, ur 15 (*) NEGATIVE mg/dL    Protein, ur >621 (*) NEGATIVE mg/dL    Urobilinogen, UA 0.2  0.0 - 1.0 mg/dL    Nitrite NEGATIVE  NEGATIVE    Leukocytes, UA NEGATIVE  NEGATIVE   URINE MICROSCOPIC-ADD ON     Status: Abnormal   Collection Time   05/18/12  1:00 PM      Component Value Range Comment   Squamous Epithelial / LPF MANY (*) RARE    WBC, UA 11-20  <3 WBC/hpf    RBC / HPF 0-2  <3 RBC/hpf    Bacteria, UA MANY (*) RARE    Casts GRANULAR CAST (*) NEGATIVE HYALINE CASTS  URINE CULTURE     Status: Normal   Collection Time   05/18/12  1:00 PM        Component Value Range Comment   Specimen Description URINE, CLEAN CATCH      Special Requests NONE      Culture  Setup Time 05/18/2012 15:10      Colony Count >=100,000 COLONIES/ML      Culture        Value: Multiple bacterial morphotypes present, none predominant. Suggest appropriate recollection if clinically indicated.   Report Status 05/19/2012 FINAL     CBC     Status: Abnormal   Collection Time   05/18/12  6:02 PM      Component Value Range Comment   WBC 11.3 (*) 4.0 - 10.5 K/uL    RBC 4.33  3.87 - 5.11 MIL/uL    Hemoglobin 12.1  12.0 - 15.0 g/dL    HCT 30.8 (*) 65.7 - 46.0 %    MCV 79.0  78.0 - 100.0 fL    MCH 27.9  26.0 - 34.0 pg    MCHC 35.4  30.0 - 36.0 g/dL    RDW 84.6 (*) 96.2 - 15.5 %    Platelets 297  150 - 400 K/uL   CREATININE, SERUM     Status: Abnormal   Collection Time   05/18/12  6:02 PM      Component Value Range Comment   Creatinine, Ser 1.11 (*) 0.50 - 1.10 mg/dL    GFR calc non Af Amer 57 (*) >90 mL/min    GFR calc Af Amer 66 (*) >90 mL/min   MRSA PCR SCREENING     Status: Normal   Collection Time   05/18/12 11:54 PM      Component Value Range Comment  MRSA by PCR NEGATIVE  NEGATIVE   CBC     Status: Abnormal   Collection Time   05/19/12  5:06 AM      Component Value Range Comment   WBC 8.1  4.0 - 10.5 K/uL    RBC 4.08  3.87 - 5.11 MIL/uL    Hemoglobin 11.2 (*) 12.0 - 15.0 g/dL    HCT 16.1 (*) 09.6 - 46.0 %    MCV 78.7  78.0 - 100.0 fL    MCH 27.5  26.0 - 34.0 pg    MCHC 34.9  30.0 - 36.0 g/dL    RDW 04.5 (*) 40.9 - 15.5 %    Platelets 311  150 - 400 K/uL   HEMOGLOBIN A1C     Status: Normal   Collection Time   05/19/12  5:06 AM      Component Value Range Comment   Hemoglobin A1C 4.9  <5.7 %    Mean Plasma Glucose 94  <117 mg/dL   COMPREHENSIVE METABOLIC PANEL     Status: Abnormal   Collection Time   05/19/12  5:06 AM      Component Value Range Comment   Sodium 140  135 - 145 mEq/L    Potassium 3.9  3.5 - 5.1 mEq/L    Chloride  104  96 - 112 mEq/L    CO2 22  19 - 32 mEq/L    Glucose, Bld 89  70 - 99 mg/dL    BUN 18  6 - 23 mg/dL    Creatinine, Ser 8.11 (*) 0.50 - 1.10 mg/dL    Calcium 9.5  8.4 - 91.4 mg/dL    Total Protein 7.5  6.0 - 8.3 g/dL    Albumin 3.5  3.5 - 5.2 g/dL    AST 18  0 - 37 U/L    ALT 10  0 - 35 U/L    Alkaline Phosphatase 79  39 - 117 U/L    Total Bilirubin 0.5  0.3 - 1.2 mg/dL    GFR calc non Af Amer 35 (*) >90 mL/min    GFR calc Af Amer 40 (*) >90 mL/min   LIPID PANEL     Status: Normal   Collection Time   05/19/12  5:06 AM      Component Value Range Comment   Cholesterol 170  0 - 200 mg/dL    Triglycerides 782  <956 mg/dL    HDL 56  >21 mg/dL    Total CHOL/HDL Ratio 3.0      VLDL 24  0 - 40 mg/dL    LDL Cholesterol 90  0 - 99 mg/dL   ANTITHROMBIN III     Status: Normal   Collection Time   05/19/12  2:11 PM      Component Value Range Comment   AntiThromb III Func 105  75 - 120 %   PROTEIN C ACTIVITY     Status: Abnormal   Collection Time   05/19/12  2:11 PM      Component Value Range Comment   Protein C Activity 200 (*) 75 - 133 %   PROTEIN C, TOTAL     Status: Normal   Collection Time   05/19/12  2:11 PM      Component Value Range Comment   Protein C, Total 94  72 - 160 %   PROTEIN S ACTIVITY     Status: Normal   Collection Time   05/19/12  2:11 PM  Component Value Range Comment   Protein S Activity 81  69 - 129 %   PROTEIN S, TOTAL     Status: Normal   Collection Time   05/19/12  2:11 PM      Component Value Range Comment   Protein S Ag, Total 79  60 - 150 %   LUPUS ANTICOAGULANT PANEL     Status: Normal   Collection Time   05/19/12  2:11 PM      Component Value Range Comment   PTT Lupus Anticoagulant 40.2  28.0 - 43.0 secs    PTTLA Confirmation NOT APPL  <8.0 secs    PTTLA 4:1 Mix NOT APPL  28.0 - 43.0 secs    Drvvt 42.8  <42.9 secs    Drvvt confirmation NOT APPL  <1.11 Ratio    dRVVT Incubated 1:1 Mix NOT APPL  <42.9 secs    Lupus Anticoagulant NOT  DETECTED  NOT DETECTED   BETA-2-GLYCOPROTEIN I ABS, IGG/M/A     Status: Normal   Collection Time   05/19/12  2:11 PM      Component Value Range Comment   Beta-2 Glyco I IgG 2  <20 G Units    Beta-2-Glycoprotein I IgM 13  <20 M Units    Beta-2-Glycoprotein I IgA 10  <20 A Units   HOMOCYSTEINE     Status: Abnormal   Collection Time   05/19/12  2:11 PM      Component Value Range Comment   Homocysteine 17.7 (*) 4.0 - 15.4 umol/L   FACTOR 5 LEIDEN     Status: Normal   Collection Time   05/19/12  2:11 PM      Component Value Range Comment   Recommendations-F5LEID: (NOTE)     CARDIOLIPIN ANTIBODIES, IGG, IGM, IGA     Status: Abnormal   Collection Time   05/19/12  2:11 PM      Component Value Range Comment   Anticardiolipin IgG 10 (*) <23 GPL U/mL    Anticardiolipin IgM 11 (*) <11 MPL U/mL    Anticardiolipin IgA 10 (*) <22 APL U/mL   URINALYSIS, ROUTINE W REFLEX MICROSCOPIC     Status: Abnormal   Collection Time   05/19/12  3:38 PM      Component Value Range Comment   Color, Urine AMBER (*) YELLOW BIOCHEMICALS MAY BE AFFECTED BY COLOR   APPearance CLOUDY (*) CLEAR    Specific Gravity, Urine 1.031 (*) 1.005 - 1.030    pH 5.0  5.0 - 8.0    Glucose, UA NEGATIVE  NEGATIVE mg/dL    Hgb urine dipstick NEGATIVE  NEGATIVE    Bilirubin Urine MODERATE (*) NEGATIVE    Ketones, ur 15 (*) NEGATIVE mg/dL    Protein, ur 161 (*) NEGATIVE mg/dL    Urobilinogen, UA 0.2  0.0 - 1.0 mg/dL    Nitrite NEGATIVE  NEGATIVE    Leukocytes, UA NEGATIVE  NEGATIVE   SODIUM, URINE, RANDOM     Status: Normal   Collection Time   05/19/12  3:38 PM      Component Value Range Comment   Sodium, Ur 26     CREATININE, URINE, RANDOM     Status: Normal   Collection Time   05/19/12  3:38 PM      Component Value Range Comment   Creatinine, Urine 566.48     URINE MICROSCOPIC-ADD ON     Status: Abnormal   Collection Time   05/19/12  3:38 PM  Component Value Range Comment   Squamous Epithelial / LPF MANY (*)  RARE    WBC, UA 0-2  <3 WBC/hpf    RBC / HPF 0-2  <3 RBC/hpf    Bacteria, UA FEW (*) RARE    Casts HYALINE CASTS (*) NEGATIVE GRANULAR CAST  BASIC METABOLIC PANEL     Status: Abnormal   Collection Time   05/20/12  5:20 AM      Component Value Range Comment   Sodium 134 (*) 135 - 145 mEq/L    Potassium 3.3 (*) 3.5 - 5.1 mEq/L    Chloride 100  96 - 112 mEq/L    CO2 23  19 - 32 mEq/L    Glucose, Bld 87  70 - 99 mg/dL    BUN 29 (*) 6 - 23 mg/dL DELTA CHECK NOTED   Creatinine, Ser 2.07 (*) 0.50 - 1.10 mg/dL    Calcium 8.9  8.4 - 16.1 mg/dL    GFR calc non Af Amer 27 (*) >90 mL/min    GFR calc Af Amer 31 (*) >90 mL/min   CBC     Status: Abnormal   Collection Time   05/20/12  5:20 AM      Component Value Range Comment   WBC 13.9 (*) 4.0 - 10.5 K/uL    RBC 3.69 (*) 3.87 - 5.11 MIL/uL    Hemoglobin 10.3 (*) 12.0 - 15.0 g/dL    HCT 09.6 (*) 04.5 - 46.0 %    MCV 78.9  78.0 - 100.0 fL    MCH 27.9  26.0 - 34.0 pg    MCHC 35.4  30.0 - 36.0 g/dL    RDW 40.9 (*) 81.1 - 15.5 %    Platelets 281  150 - 400 K/uL   SEDIMENTATION RATE     Status: Abnormal   Collection Time   05/20/12  9:48 AM      Component Value Range Comment   Sed Rate 37 (*) 0 - 22 mm/hr      HEENT: normal Cardio: RRR Resp: CTA B/L GI: BS positive Extremity:  No Edema Skin:   Intact Neuro: Alert/Oriented, Cranial Nerve II-XII normal, Abnormal Motor 3-/5 L Delt, Bi, Tri, grip, HF, KE and Ankle DF and Abnormal FMC Ataxic/ dec FMC Musc/Skel:  Normal Gen:  NAD Mood affect flat  Assessment/Plan: 1. Functional deficits secondary to right internal capsule, thalamus and left insula and deep frontal white matter infarcts, likely thrombotic   which require 3+ hours per day of interdisciplinary therapy in a comprehensive inpatient rehab setting. Physiatrist is providing close team supervision and 24 hour management of active medical problems listed below. Physiatrist and rehab team continue to assess barriers to  discharge/monitor patient progress toward functional and medical goals. FIM:       FIM - Toileting Toileting steps completed by patient: Adjust clothing prior to toileting;Performs perineal hygiene;Adjust clothing after toileting Toileting: 4: Steadying assist           Comprehension Comprehension Mode: Auditory Comprehension: 5-Follows basic conversation/direction: With no assist  Expression Expression Mode: Verbal Expression: 5-Expresses basic needs/ideas: With extra time/assistive device  Social Interaction Social Interaction: 6-Interacts appropriately with others with medication or extra time (anti-anxiety, antidepressant).  Problem Solving Problem Solving: 5-Solves basic problems: With no assist  Memory Memory: 5-Recognizes or recalls 90% of the time/requires cueing < 10% of the time  Medical Problem List and Plan:  1. DVT Prophylaxis/Anticoagulation: Pharmaceutical: Lovenox  2. Pain Management: N/C  3. Mood: Continue Zoloft  50mg . Flat affect noted. Order neuropsych and have LCSW follow for formal evaluation.  4. Neuropsych: This patient is capable of making decisions on his/her own behalf.  5. HTN: monitor with bid checks. Continue Norvasc, HCTZ and metoprolol. Monitor hydration status on diuretics.  6. Migraine: continue tramadol for chronic headaches.  7: Hyperlipidemia: On lipitor daily.  8. CKD: Off ace due to worsening of renal status. Continue with IVF and recheck labs in am.  9. Constipation: . Continue Reglan. Start miralax    LOS (Days) 1 A FACE TO FACE EVALUATION WAS PERFORMED  KIRSTEINS,ANDREW E 05/21/2012, 10:04 AM

## 2012-05-21 NOTE — Evaluation (Signed)
Occupational Therapy Assessment and Plan  Patient Details  Name: Lindsey [name] MRN: [id number] Date of Birth: March 02, 1961  OT Diagnosis: ataxia, disturbance of vision, hemiplegia affecting non-dominant side and muscle weakness (generalized) Rehab Potential: Rehab Potential: Good ELOS: 2 weeks   Today's Date: 05/21/2012 Time: 0900-1000 Time Calculation (min): 60 min  Problem List:  Patient Active Problem List  Diagnosis  . HYPERLIPIDEMIA  . OBESITY, MORBID  . Essential hypertension, benign  . CVA  . GASTROINTESTINAL DISORDER, FUNCTIONAL  . ARNOLD-CHIARI MALFORMATION  . VERTIGO  . SLEEP APNEA  . HEADACHE  . Other dyspnea and respiratory abnormality  . CHEST PAIN UNSPECIFIED  . EMESIS  . DIARRHEA  . CONTRAST DYE ALLERGY  . BACK PAIN, LUMBAR, WITH RADICULOPATHY  . Neuropathy  . Well woman exam  . Ear pain  . Acute kidney injury  . Stroke    Past Medical History:  Past Medical History  Diagnosis Date  . Hypertension   . Chronic kidney disease   . Neuropathy   . Sleep apnea   . Chiari I malformation   . Hyperlipidemia   . CVA (cerebral infarction)   . GERD (gastroesophageal reflux disease)   . Migraine   . Daily headache     "lately" (05/18/12)  . Stroke 2008;  ?2009;     "twice; once   Past Surgical History:  Past Surgical History  Procedure Date  . Reduction mammaplasty ~ 2002  . Cesarean section 4098; 1983; 1989  . Craniectomy suboccipital w/ cervical laminectomy / chiari 2000's  . Brain surgery     Assessment & Plan Clinical Impression: Patient is a 51 y.o. 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 Patient was independent prior to admission without the use of assistive device. Admitted 05/18/2012 after a fall and increased left-sided weakness. MRI of the brain showed acute infarct right  internal capsule right thalamus and small area of acute infarct in the left insula and left deep frontal white matter. Patient was on aspirin prior to admission and aggrenox was added for admission. Neurology services consulted and recommends TEE/outpatient holter monitor. Patient changed to plavix for CVA prophylaxis. Carotid dopplers without ICA stenosis. Cardiac echo done today. Patient has has worsening of renal insufficiency and ACE d/c and IVF initiated for hydration due to rise in creatinine.   Patient transferred to CIR on 05/20/2012 .    Patient currently requires max with basic self-care skills secondary to abnormal tone, unbalanced muscle activation, ataxia and decreased coordination, decreased visual motor skills and decreased standing balance and hemiplegia.  Prior to hospitalization, patient could complete ADLs with independence.  Patient will benefit from skilled intervention to increase independence with basic self-care skills prior to discharge pt would like to return home but daughter would like her to come to her house.  Anticipate patient will require intermittent supervision and follow up home health.  OT - End of Session Activity Tolerance: Tolerates 30+ min activity without fatigue OT Assessment Rehab Potential: Good Barriers to Discharge: Decreased caregiver support Barriers to Discharge Comments: Pt reports her niece can provide assistance, however daughter reports that niece won't be staying long term and thinks it would be best for pt to d/c to her home where she can check on her when not at work and check every 3ish hours when at work OT Plan OT Frequency: 1-2 X/day, 60-90 minutes  Estimated Length of Stay: 2 weeks OT Treatment/Interventions: Balance/vestibular training;Community reintegration;Discharge planning;DME/adaptive equipment instruction;Functional mobility training;Neuromuscular re-education;Pain management;Patient/family education;Psychosocial support;Self  Care/advanced ADL retraining;Therapeutic Activities;Therapeutic Exercise;UE/LE Strength taining/ROM;UE/LE Coordination activities;Visual/perceptual remediation/compensation;Cognitive remediation/compensation OT Recommendation Follow Up Recommendations: Home health OT Equipment Recommended: 3 in 1 bedside comode;Tub/shower bench  OT Evaluation Precautions/Restrictions  Precautions Precautions: Fall General   Vital Signs   Pain Pain Assessment Pain Assessment: No/denies pain Pain Score:   4 Pain Intervention(s): Repositioned Home Living/Prior Functioning Home Living Lives With: Alone Available Help at Discharge: Family (niece short term v move in with dtr who works 7-3) Type of Home: House Home Access: Stairs to enter Secretary/administrator of Steps: 1 at her house v 3 at daughters Entrance Stairs-Rails:  (None at her house v Rt railing at [employer]) Home Layout: One level Bathroom Shower/Tub: Fish farm manager Equipment: None Prior Function Level of Independence: Independent with basic ADLs;Independent with gait Able to Take Stairs?: Yes Driving: No Vocation: Unemployed 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 Grooming: Setup;Supervision/safety Where Assessed-Grooming: Standing at sink Upper Body Bathing: Setup Where Assessed-Upper Body Bathing: Sitting at sink Lower Body Bathing: Moderate assistance Where Assessed-Lower Body Bathing: Sitting at sink;Standing at sink Upper Body Dressing: Minimal assistance Where Assessed-Upper Body Dressing: Sitting at sink Lower Body Dressing: Maximal assistance Where Assessed-Lower Body Dressing: Sitting at sink;Standing at sink Toilet Transfer: Minimal assistance Toilet Transfer Method: Ambulating Toilet Transfer Equipment: Bedside commode Vision/Perception  Vision - History Baseline Vision: No visual deficits Patient Visual Report: Blurring of vision Vision - Assessment Eye Alignment: Within Functional  Limits Vision Assessment: Vision tested Ocular Range of Motion: Within Functional Limits Alignment/Gaze Preference: Gaze left Tracking/Visual Pursuits: Requires cues, head turns, or add eye shifts to track Saccades: Additional eye shifts occurred during testing;Decreased speed of saccadic movement Convergence: Within functional limits Perception Perception: Within Functional Limits  Cognition   Sensation Sensation Light Touch: Appears Intact Proprioception: Appears Intact Coordination Gross Motor Movements are Fluid and Coordinated: No Fine Motor Movements are Fluid and Coordinated: No Coordination and Movement Description: some noted ataxic movements in LUE and LLE Motor    Mobility  Bed Mobility Bed Mobility: Rolling Left;Left Sidelying to Sit;Sitting - Scoot to [employer] of Bed Rolling Left: 5: Supervision Rolling Left Details: Verbal cues for technique;Verbal cues for precautions/safety Left Sidelying to Sit: 4: Min assist;HOB flat  Trunk/Postural Assessment     Balance   Extremity/Trunk Assessment RUE Assessment RUE Assessment: Within Functional Limits LUE Assessment LUE Assessment: Exceptions to WFL LUE AROM (degrees) LUE Overall AROM Comments: ROM WFL LUE Strength LUE Overall Strength Comments:  LUE is grossly 3 proximal to 2-3/5 distally.  See FIM for current functional status Refer to Care Plan for Long Term Goals  Recommendations for other services: None  Discharge Criteria: Patient will be discharged from OT if patient refuses treatment 3 consecutive times without medical reason, if treatment goals not met, if there is a change in medical status, if patient makes no progress towards goals or if patient is discharged from hospital.  The above assessment, treatment plan, treatment alternatives and goals were discussed and mutually agreed upon: by patient and by family  Leonette Monarch 05/21/2012, 10:33 AM

## 2012-05-21 NOTE — Evaluation (Signed)
Physical Therapy Assessment and Plan  Patient Details  Name: Lindsey [name] MRN: [id number] Date of Birth: 04/11/1961  PT Diagnosis: Abnormality of gait, Coordination disorder, Hemiparesis non-dominant and Vertigo Rehab Potential: Good ELOS: 2 weeks   Today's Date: 05/21/2012 Time:   13:30-14:30 ( )   Problem List:  Patient Active Problem List  Diagnosis  . HYPERLIPIDEMIA  . OBESITY, MORBID  . Essential hypertension, benign  . CVA  . GASTROINTESTINAL DISORDER, FUNCTIONAL  . ARNOLD-CHIARI MALFORMATION  . VERTIGO  . SLEEP APNEA  . HEADACHE  . Other dyspnea and respiratory abnormality  . CHEST PAIN UNSPECIFIED  . EMESIS  . DIARRHEA  . CONTRAST DYE ALLERGY  . BACK PAIN, LUMBAR, WITH RADICULOPATHY  . Neuropathy  . Well woman exam  . Ear pain  . Acute kidney injury  . Stroke    Past Medical History:  Past Medical History  Diagnosis Date  . Hypertension   . Chronic kidney disease   . Neuropathy   . Sleep apnea   . Chiari I malformation   . Hyperlipidemia   . CVA (cerebral infarction)   . GERD (gastroesophageal reflux disease)   . Migraine   . Daily headache     "lately" (05/18/12)  . Stroke 2008;  ?2009;     "twice; once   Past Surgical History:  Past Surgical History  Procedure Date  . Reduction mammaplasty ~ 2002  . Cesarean section 5621; 1983; 1989  . Craniectomy suboccipital w/ cervical laminectomy / chiari 2000's  . Brain surgery     Assessment & Plan Clinical Impression: Lindsey [name] is a 51 y.o. 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 Patient was independent prior to admission without the use of assistive device. Admitted 05/18/2012 after a fall and increased left-sided weakness. MRI of the brain showed acute infarct right internal capsule right thalamus and small area of acute infarct in  the left insula and left deep frontal white matter. Patient was on aspirin prior to admission and aggrenox was added for admission. Neurology services consulted and recommends TEE/outpatient holter monitor. Patient changed to plavix for CVA prophylaxis. Carotid dopplers without ICA stenosis. Cardiac echo done today. Patient has has worsening of renal insufficiency and ACE d/c and IVF initiated for hydration due to rise in creatinine. Patient has had improvement in left sided weakness since last pm. Therapies ongoing and CIR recommended for progression.  Patient transferred to CIR on 05/20/2012 .   Patient currently requires mod with mobility secondary to impaired timing and sequencing, unbalanced muscle activation, ataxia and decreased coordination, decreased visual acuity and h/o "vertigo disorder".  Prior to hospitalization, patient was Independent with mobility and lived with Alone in a House home.  Home access is 1 at her house v 3 at daughtersStairs to enter.  Patient will benefit from skilled PT intervention to maximize safe functional mobility, minimize fall risk and decrease caregiver burden for planned discharge home with 24 hour supervision.  Anticipate patient will benefit from follow up HH at discharge.  PT - End of Session Activity Tolerance: Tolerates < 10 min activity, no significant change in vital signs Endurance Deficit: Yes Endurance Deficit Description: Needing seated rest following each activity PT Assessment Rehab Potential: Good Barriers to Discharge: None PT Plan PT Frequency: 1-2 X/day, 60-90 minutes Estimated Length of Stay: 2 weeks PT Treatment/Interventions: Ambulation/gait training;Balance/vestibular training;Discharge planning;Disease management/prevention;DME/adaptive equipment instruction;Functional  electrical stimulation;Functional mobility training;Neuromuscular re-education;Pain management;Patient/family education;Psychosocial support;Splinting/orthotics;Stair  training;Therapeutic Activities;Therapeutic Exercise;UE/LE Strength taining/ROM;UE/LE Coordination activities;Visual/perceptual remediation/compensation;Wheelchair propulsion/positioning PT Recommendation Recommendations for Other Services: Neuropsych consult;Vestibular eval Follow Up Recommendations: Home health PT;24 hour supervision/assistance Equipment Recommended: 3 in 1 bedside comode;Tub/shower bench;Cane Equipment Details: TBD  PT Evaluation Precautions/Restrictions Precautions Precautions: Fall Precaution Comments: L hemiparesis, h/o "vertigo" disorder per pt Restrictions Weight Bearing Restrictions: No Pain: none   Home Living/Prior Functioning Home Living Lives With: Alone Available Help at Discharge: Family Type of Home: House Home Access: Stairs to enter [employer] of Steps: 1 at her house v 3 at daughters Entrance Stairs-Rails: None (Both rails at 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 Vision/Perception  Vision - History Baseline Vision: No visual deficits Patient Visual Report: Blurring of vision Vision - Assessment Eye Alignment: Within Functional Limits Perception Perception: Within Functional Limits Praxis Praxis: Intact  Cognition Arousal/Alertness: Awake/alert Orientation Level: Oriented X4 Sensation Sensation Light Touch: Appears Intact Proprioception: Appears Intact Coordination Gross Motor Movements are Fluid and Coordinated: No Fine Motor Movements are Fluid and Coordinated: No Coordination and Movement Description: some noted ataxic movements in LUE and LLE Heel Shin Test: Impaired Motor  Motor Motor: Hemiplegia;Ataxia;Motor impersistence Motor - Skilled Clinical  Observations: Weakness leading to decreased functional coordination  Mobility Transfers Sit to Stand: 3: Mod assist Sit to Stand Details: Verbal cues for sequencing;Verbal cues for technique;Manual facilitation for weight shifting Sit to Stand Details (indicate cue type and reason): Lifting and steadying assist needed Stand to Sit: 3: Mod assist Stand to Sit Details (indicate cue type and reason): Verbal cues for sequencing;Verbal cues for technique Stand to Sit Details: Assist needed to control descent Locomotion  Ambulation Ambulation: Yes Ambulation/Gait Assistance: 2: Max assist Ambulation Distance (Feet): 25 Feet Assistive device: 1 person hand held assist Ambulation/Gait Assistance Details: Steadying assist for gait and support for L stance phase du eto knee instability Stairs / Additional Locomotion Stairs: Yes Stairs Assistance: 3: Mod assist Stairs Assistance Details: Verbal cues for sequencing;Verbal cues for technique;Tactile cues for weight shifting;Verbal cues for safe use of DME/AE;Verbal cues for gait pattern;Verbal cues for precautions/safety;Manual facilitation for weight shifting;Manual facilitation for placement Stairs Assistance Details (indicate cue type and reason): Assist steadying during R stance and for all descent Stair Management Technique: Two rails;Step to pattern;Forwards Number of Stairs: 3  Height of Stairs: 6  Corporate treasurer: Yes Wheelchair Assistance: 3: Mod Education officer, museum: Both upper extremities Wheelchair Parts Management: Needs assistance Distance: 30  Trunk/Postural Assessment  Cervical Assessment Cervical Assessment: Within Functional Limits Thoracic Assessment Thoracic Assessment: Within Functional Limits Lumbar Assessment Lumbar Assessment: Within Functional Limits  Balance Balance Balance Assessed: Yes Static Sitting Balance Static Sitting - Balance Support: Feet supported;No upper extremity  supported Static Sitting - Level of Assistance: 5: Stand by assistance Static Sitting - Comment/# of Minutes: during functional task, equipment set up Static Standing Balance Static Standing - Level of Assistance: 4: Min assist Dynamic Standing Balance Dynamic Standing - Balance Support: Left upper extremity supported Dynamic Standing - Level of Assistance: 3: Mod assist Dynamic Standing - Balance Activities: Lateral lean/weight shifting;Forward lean/weight shifting Dynamic Standing - Comments: Balance impaired due to decreased L knee stability Extremity Assessment   RUE WFL LUE grossly 3/5 throughout   RLE Assessment RLE Assessment: Within  Functional Limits LLE Assessment LLE Assessment: Exceptions to [facility] LLE Strength LLE Overall Strength Comments: Strength grossly 3/5 throughout LE, ROM WFL  See FIM for current functional status Refer to Care Plan for Long Term Goals  Tx initiated this session with NMR for LLE including static stance at mirror for midline orientation and equal weight-bearing, step-taps to 4" step with R/L x20 each with 1UE support and assist for L knee stability in stance, as well as gait with no device to facilitate LLE ext contraction and timing. Pt educated on reducing risk factors for subsequent CVAs.   Recommendations for other services: Neuropsych and Other: Vestibular eval  Discharge Criteria: Patient will be discharged from PT if patient refuses treatment 3 consecutive times without medical reason, if treatment goals not met, if there is a change in medical status, if patient makes no progress towards goals or if patient is discharged from hospital.  The above assessment, treatment plan, treatment alternatives and goals were discussed and mutually agreed upon: by patient  Virl Cagey, PT 05/21/2012, 2:36 PM

## 2012-05-21 NOTE — Progress Notes (Signed)
Occupational Therapy Session Note  Patient Details  Name: Lindsey [name] MRN: [id number] Date of Birth: 02-Dec-1960  Today's Date: 05/21/2012 Time: 0900-1000 and 1300-1330 Time Calculation (min): 60 min and 30 min  Short Term Goals: Week 1:  OT Short Term Goal 1 (Week 1): Pt will complete bathing at min assist level OT Short Term Goal 2 (Week 1): Pt will complete LB dressing with min assist OT Short Term Goal 3 (Week 1): Pt will complete toilet transfers with supervision with LRAD OT Short Term Goal 4 (Week 1): Pt will complete tub/shower transfers with min assist  Skilled Therapeutic Interventions/Progress Updates:  Balance/vestibular training;Community reintegration;Discharge planning;DME/adaptive equipment instruction;Functional mobility training;Neuromuscular re-education;Pain management;Patient/family education;Psychosocial support;Self Care/advanced ADL retraining;Therapeutic Activities;Therapeutic Exercise;UE/LE Strength taining/ROM;UE/LE Coordination activities;Visual/perceptual remediation/compensation;Cognitive remediation/compensation   1) OT eval completed, followed by ADL assessment with bathing and dressing at sink.  Pt required HHA to ambulate approx 8 feet from bed to sink and completed bathing and dressing at sit <> stand level with min/steady assist to stand.  Pt demonstrated decreased ability to reach BLE to don socks and shoes, requiring assistance.  Pt's daughter arrived during session and reports wanting to have pt stay with her weeks-month, however pt wants to return to her home.  Left sided weakness with ataxia LUE. LUE is grossly 3 proximal to 2-3/5 distally, weak grasp.  2) 1:1 OT with focus on functional mobility and transfers relating to self-care.  Upon arrival, pt reports needing to use toilet.  Ambulated with min assist to toilet, pt completed 3/3 toileting tasks with min/steadying assist.  Pt required assistance donning Lt sock and shoe as well as tying both.   Engaged in tub/shower transfer with attempt to step over tub ledge with use of grab bars, pt unable to lift both legs over ledge.  Educated pt on use of tub bench to increase safety and ease with tub/shower transfer.  Pt return demonstrated tub transfer with tub bench with min assist.  Therapy Documentation Precautions:  Precautions Precautions: Fall Pain: Pain Assessment Pain Assessment: No/denies pain ADL: ADL Grooming: Setup;Supervision/safety Where Assessed-Grooming: Standing at sink Upper Body Bathing: Setup Where Assessed-Upper Body Bathing: Sitting at sink Lower Body Bathing: Moderate assistance Where Assessed-Lower Body Bathing: Sitting at sink;Standing at sink Upper Body Dressing: Minimal assistance Where Assessed-Upper Body Dressing: Sitting at sink Lower Body Dressing: Maximal assistance Where Assessed-Lower Body Dressing: Sitting at sink;Standing at sink Toilet Transfer: Minimal assistance Toilet Transfer Method: Ambulating Toilet Transfer Equipment: Bedside commode  See FIM for current functional status  Therapy/Group: Individual Therapy  Leonette Monarch 05/21/2012, 12:01 PM

## 2012-05-22 ENCOUNTER — Inpatient Hospital Stay (HOSPITAL_COMMUNITY): Payer: Medicare Other | Admitting: Physical Therapy

## 2012-05-22 LAB — BASIC METABOLIC PANEL
CO2: 25 mEq/L (ref 19–32)
Calcium: 9.1 mg/dL (ref 8.4–10.5)
Chloride: 104 mEq/L (ref 96–112)
Creatinine, Ser: 1.45 mg/dL — ABNORMAL HIGH (ref 0.50–1.10)
Glucose, Bld: 86 mg/dL (ref 70–99)
Sodium: 137 mEq/L (ref 135–145)

## 2012-05-22 MED ORDER — HYDROCHLOROTHIAZIDE 25 MG PO TABS
12.5000 mg | ORAL_TABLET | Freq: Every day | ORAL | Status: DC
  Filled 2012-05-22 (×3): qty 0.5

## 2012-05-22 NOTE — Progress Notes (Signed)
Physical Therapy Note  Patient Details  Name: CINA SCHNALL MRN: [id number] Date of Birth: Nov 01, 1960 Today's Date: 05/22/2012  1130-1155 (25 minutes) individual Pain: no complaint of pain Focus of treatment: Gait training Treatment: Gait with RW + hand splint left hand min assist 40 feet X 2 with min vcs to maintain step length on left.    Marie Borowski,JIM 05/22/2012, 7:30 AM

## 2012-05-22 NOTE — Care Management Note (Signed)
    Page 1 of 1   05/22/2012     8:23:48 AM   CARE MANAGEMENT NOTE 05/22/2012  Patient:  Lindsey [name], Lindsey [name]   Account Number:  [id number]  Date Initiated:  05/19/2012  Documentation initiated by:  Jacquelynn Cree  Subjective/Objective Assessment:   Admitted with CVA.     Action/Plan:   PT/OT evals-recommended inpatient rehab   Anticipated DC Date:  05/20/2012   Anticipated DC Plan:  IP REHAB FACILITY      DC Planning Services  CM consult      Choice offered to / List presented to:             Status of service:  Completed, signed off Medicare Important Message given?   (If response is "NO", the following Medicare IM given date fields will be blank) Date Medicare IM given:   Date Additional Medicare IM given:    Discharge Disposition:  IP REHAB FACILITY  Per UR Regulation:  Reviewed for med. necessity/level of care/duration of stay  If discussed at Long Length of Stay Meetings, dates discussed:    Comments:

## 2012-05-22 NOTE — Progress Notes (Signed)
Patient ID: Lindsey [name], female   DOB: 07-Aug-1960, 51 y.o.   MRN: [id number] 51 y.o. 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 Patient was independent prior to admission without the use of assistive device. Admitted 05/18/2012 after a fall and increased left-sided weakness. MRI of the brain showed acute infarct right internal capsule right thalamus and small area of acute infarct in the left insula and left deep frontal white matter. Patient was on aspirin prior to admission and aggrenox was added  Subjective/Complaints:  Nausea, no vomiting  Incomplete Is and Os Objective: Vital Signs: Blood pressure 126/78, pulse 67, temperature 98.7 F (37.1 C), temperature source Oral, resp. rate 18, height 4\' 11"  (1.499 m), weight 80.1 kg (176 lb 9.4 oz), last menstrual period 05/11/2012, SpO2 94.00%. No results found. Results for orders placed during the hospital encounter of 05/18/12 (from the past 72 hour(s))  ANTITHROMBIN III     Status: Normal   Collection Time   05/19/12  2:11 PM      Component Value Range Comment   AntiThromb III Func 105  75 - 120 %   PROTEIN C ACTIVITY     Status: Abnormal   Collection Time   05/19/12  2:11 PM      Component Value Range Comment   Protein C Activity 200 (*) 75 - 133 %   PROTEIN C, TOTAL     Status: Normal   Collection Time   05/19/12  2:11 PM      Component Value Range Comment   Protein C, Total 94  72 - 160 %   PROTEIN S ACTIVITY     Status: Normal   Collection Time   05/19/12  2:11 PM      Component Value Range Comment   Protein S Activity 81  69 - 129 %   PROTEIN S, TOTAL     Status: Normal   Collection Time   05/19/12  2:11 PM      Component Value Range Comment   Protein S Ag, Total 79  60 - 150 %   LUPUS ANTICOAGULANT PANEL     Status: Normal   Collection Time   05/19/12  2:11 PM   Component Value Range Comment   PTT Lupus Anticoagulant 40.2  28.0 - 43.0 secs    PTTLA Confirmation NOT APPL  <8.0 secs    PTTLA 4:1 Mix NOT APPL  28.0 - 43.0 secs    Drvvt 42.8  <42.9 secs    Drvvt confirmation NOT APPL  <1.11 Ratio    dRVVT Incubated 1:1 Mix NOT APPL  <42.9 secs    Lupus Anticoagulant NOT DETECTED  NOT DETECTED   BETA-2-GLYCOPROTEIN I ABS, IGG/M/A     Status: Normal   Collection Time   05/19/12  2:11 PM      Component Value Range Comment   Beta-2 Glyco I IgG 2  <20 G Units    Beta-2-Glycoprotein I IgM 13  <20 M Units    Beta-2-Glycoprotein I IgA 10  <20 A Units   HOMOCYSTEINE     Status: Abnormal   Collection Time   05/19/12  2:11 PM      Component Value Range Comment   Homocysteine 17.7 (*) 4.0 - 15.4 umol/L   FACTOR 5 LEIDEN     Status: Normal   Collection Time   05/19/12  2:11 PM  Component Value Range Comment   Recommendations-F5LEID: (NOTE)     CARDIOLIPIN ANTIBODIES, IGG, IGM, IGA     Status: Abnormal   Collection Time   05/19/12  2:11 PM      Component Value Range Comment   Anticardiolipin IgG 10 (*) <23 GPL U/mL    Anticardiolipin IgM 11 (*) <11 MPL U/mL    Anticardiolipin IgA 10 (*) <22 APL U/mL   URINALYSIS, ROUTINE W REFLEX MICROSCOPIC     Status: Abnormal   Collection Time   05/19/12  3:38 PM      Component Value Range Comment   Color, Urine AMBER (*) YELLOW BIOCHEMICALS MAY BE AFFECTED BY COLOR   APPearance CLOUDY (*) CLEAR    Specific Gravity, Urine 1.031 (*) 1.005 - 1.030    pH 5.0  5.0 - 8.0    Glucose, UA NEGATIVE  NEGATIVE mg/dL    Hgb urine dipstick NEGATIVE  NEGATIVE    Bilirubin Urine MODERATE (*) NEGATIVE    Ketones, ur 15 (*) NEGATIVE mg/dL    Protein, ur 782 (*) NEGATIVE mg/dL    Urobilinogen, UA 0.2  0.0 - 1.0 mg/dL    Nitrite NEGATIVE  NEGATIVE    Leukocytes, UA NEGATIVE  NEGATIVE   SODIUM, URINE, RANDOM     Status: Normal   Collection Time   05/19/12  3:38 PM      Component Value Range Comment   Sodium, Ur 26       CREATININE, URINE, RANDOM     Status: Normal   Collection Time   05/19/12  3:38 PM      Component Value Range Comment   Creatinine, Urine 566.48     URINE MICROSCOPIC-ADD ON     Status: Abnormal   Collection Time   05/19/12  3:38 PM      Component Value Range Comment   Squamous Epithelial / LPF MANY (*) RARE    WBC, UA 0-2  <3 WBC/hpf    RBC / HPF 0-2  <3 RBC/hpf    Bacteria, UA FEW (*) RARE    Casts HYALINE CASTS (*) NEGATIVE GRANULAR CAST  BASIC METABOLIC PANEL     Status: Abnormal   Collection Time   05/20/12  5:20 AM      Component Value Range Comment   Sodium 134 (*) 135 - 145 mEq/L    Potassium 3.3 (*) 3.5 - 5.1 mEq/L    Chloride 100  96 - 112 mEq/L    CO2 23  19 - 32 mEq/L    Glucose, Bld 87  70 - 99 mg/dL    BUN 29 (*) 6 - 23 mg/dL DELTA CHECK NOTED   Creatinine, Ser 2.07 (*) 0.50 - 1.10 mg/dL    Calcium 8.9  8.4 - 95.6 mg/dL    GFR calc non Af Amer 27 (*) >90 mL/min    GFR calc Af Amer 31 (*) >90 mL/min   CBC     Status: Abnormal   Collection Time   05/20/12  5:20 AM      Component Value Range Comment   WBC 13.9 (*) 4.0 - 10.5 K/uL    RBC 3.69 (*) 3.87 - 5.11 MIL/uL    Hemoglobin 10.3 (*) 12.0 - 15.0 g/dL    HCT 21.3 (*) 08.6 - 46.0 %    MCV 78.9  78.0 - 100.0 fL    MCH 27.9  26.0 - 34.0 pg    MCHC 35.4  30.0 - 36.0 g/dL    RDW  15.9 (*) 11.5 - 15.5 %    Platelets 281  150 - 400 K/uL   SEDIMENTATION RATE     Status: Abnormal   Collection Time   05/20/12  9:48 AM      Component Value Range Comment   Sed Rate 37 (*) 0 - 22 mm/hr      HEENT: normal Cardio: RRR Resp: CTA B/L GI: BS positive Extremity:  No Edema Skin:   Intact Neuro: Alert/Oriented, Cranial Nerve II-XII normal, Abnormal Motor 3-/5 L Delt, Bi, Tri, grip, HF, KE and Ankle DF and Abnormal FMC Ataxic/ dec FMC Musc/Skel:  Normal Gen:  NAD Mood affect flat  Assessment/Plan: 1. Functional deficits secondary to right internal capsule, thalamus and left insula and deep frontal white matter  infarcts, likely thrombotic   which require 3+ hours per day of interdisciplinary therapy in a comprehensive inpatient rehab setting. Physiatrist is providing close team supervision and 24 hour management of active medical problems listed below. Physiatrist and rehab team continue to assess barriers to discharge/monitor patient progress toward functional and medical goals. FIM: FIM - Bathing Bathing Steps Patient Completed: Chest;Right Arm;Left Arm;Front perineal area;Abdomen;Right upper leg;Left upper leg Bathing: 3: Mod-Patient completes 5-7 53f 10 parts or 50-74%  FIM - Upper Body Dressing/Undressing Upper body dressing/undressing steps patient completed: Thread/unthread right sleeve of pullover shirt/dresss;Put head through opening of pull over shirt/dress;Pull shirt over trunk Upper body dressing/undressing: 4: Min-Patient completed 75 plus % of tasks FIM - Lower Body Dressing/Undressing Lower body dressing/undressing steps patient completed: Thread/unthread right pants leg;Pull pants up/down;Thread/unthread right underwear leg;Pull underwear up/down Lower body dressing/undressing: 2: Max-Patient completed 25-49% of tasks  FIM - Toileting Toileting steps completed by patient: Adjust clothing prior to toileting;Performs perineal hygiene;Adjust clothing after toileting Toileting Assistive Devices: Grab bar or rail for support Toileting: 4: Steadying assist  FIM - Diplomatic Services operational officer Devices: Grab bars Toilet Transfers: 4-To toilet/BSC: Min A (steadying Pt. > 75%)  FIM - Bed/Chair Transfer Bed/Chair Transfer Assistive Devices: Arm rests Bed/Chair Transfer: 3: Chair or W/C > Bed: Mod A (lift or lower assist);4: Bed > Chair or W/C: Min A (steadying Pt. > 75%)  FIM - Locomotion: Wheelchair Distance: 30 Locomotion: Wheelchair: 1: Travels less than 50 ft with moderate assistance (Pt: 50 - 74%) FIM - Locomotion: Ambulation Locomotion: Ambulation Assistive Devices:  Other (comment) (None) Ambulation/Gait Assistance: 2: Max assist Locomotion: Ambulation: 1: Travels less than 50 ft with maximal assistance (Pt: 25 - 49%)  Comprehension Comprehension Mode: Auditory Comprehension: 5-Follows basic conversation/direction: With no assist  Expression Expression Mode: Verbal Expression: 5-Expresses basic needs/ideas: With extra time/assistive device  Social Interaction Social Interaction: 6-Interacts appropriately with others with medication or extra time (anti-anxiety, antidepressant).  Problem Solving Problem Solving: 5-Solves basic 90% of the time/requires cueing < 10% of the time  Memory Memory: 5-Recognizes or recalls 90% of the time/requires cueing < 10% of the time  Medical Problem List and Plan:  1. DVT Prophylaxis/Anticoagulation: Pharmaceutical: Lovenox  2. Pain Management: N/C  3. Mood: Continue Zoloft 50mg . Flat affect noted. Order neuropsych and have LCSW follow for formal evaluation.  4. Neuropsych: This patient is capable of making decisions on his/her own behalf.  5. HTN: monitor with bid checks. Continue Norvasc, HCTZ and metoprolol. Monitor hydration status on diuretics. Reduce HCTZ due to elevated BUN/Creat 6. Migraine: continue tramadol for chronic headaches.  7: Hyperlipidemia: On lipitor daily.  8. CKD: Off ace due to worsening of renal status. Continue with IVF and recheck  labs today  9. Constipation: . Marland Kitchen Start miralax, D/C reglan and monitor   LOS (Days) 2 A FACE TO FACE EVALUATION WAS PERFORMED  Gaylord Seydel E 05/22/2012, 9:36 AM

## 2012-05-23 ENCOUNTER — Inpatient Hospital Stay (HOSPITAL_COMMUNITY): Payer: Medicare Other | Admitting: *Deleted

## 2012-05-23 ENCOUNTER — Inpatient Hospital Stay (HOSPITAL_COMMUNITY): Payer: Medicare Other | Admitting: Occupational Therapy

## 2012-05-23 DIAGNOSIS — I6330 Cerebral infarction due to thrombosis of unspecified cerebral artery: Secondary | ICD-10-CM

## 2012-05-23 DIAGNOSIS — G8110 Spastic hemiplegia affecting unspecified side: Secondary | ICD-10-CM

## 2012-05-23 LAB — CBC WITH DIFFERENTIAL/PLATELET
Basophils Absolute: 0.1 10*3/uL (ref 0.0–0.1)
Basophils Relative: 1 % (ref 0–1)
HCT: 30.5 % — ABNORMAL LOW (ref 36.0–46.0)
Lymphocytes Relative: 56 % — ABNORMAL HIGH (ref 12–46)
Monocytes Absolute: 0.5 10*3/uL (ref 0.1–1.0)
Neutro Abs: 2.5 10*3/uL (ref 1.7–7.7)
Neutrophils Relative %: 30 % — ABNORMAL LOW (ref 43–77)
Platelets: 282 10*3/uL (ref 150–400)
RDW: 16.3 % — ABNORMAL HIGH (ref 11.5–15.5)
WBC: 8.2 10*3/uL (ref 4.0–10.5)

## 2012-05-23 LAB — COMPREHENSIVE METABOLIC PANEL
Alkaline Phosphatase: 69 U/L (ref 39–117)
BUN: 19 mg/dL (ref 6–23)
CO2: 24 mEq/L (ref 19–32)
GFR calc Af Amer: 55 mL/min — ABNORMAL LOW (ref >90)
GFR calc non Af Amer: 48 mL/min — ABNORMAL LOW (ref >90)
Glucose, Bld: 80 mg/dL (ref 70–99)
Potassium: 4.3 mEq/L (ref 3.5–5.1)
Total Protein: 6.5 g/dL (ref 6.0–8.3)

## 2012-05-23 MED ORDER — HYDROCHLOROTHIAZIDE 12.5 MG PO CAPS
12.5000 mg | ORAL_CAPSULE | Freq: Every day | ORAL | Status: DC
  Administered 2012-05-23 – 2012-05-31 (×9): 12.5 mg via ORAL
  Filled 2012-05-23 (×11): qty 1

## 2012-05-23 NOTE — Progress Notes (Signed)
Occupational Therapy Session Note  Patient Details  Name: Lindsey [name] MRN: [id number] Date of Birth: 1961-05-14  Today's Date: 05/23/2012 Time: 1305-1350 Time Calculation (min): 45 min  Short Term Goals: Week 1:  OT Short Term Goal 1 (Week 1): Pt will complete bathing at min assist level OT Short Term Goal 2 (Week 1): Pt will complete LB dressing with min assist OT Short Term Goal 3 (Week 1): Pt will complete toilet transfers with supervision with LRAD OT Short Term Goal 4 (Week 1): Pt will complete tub/shower transfers with min assist  Skilled Therapeutic Interventions/Progress Updates:  Patient resting in w/c upon arrival.  Reports that she has been eating pecans in the shell and there were shells all over the floor.  Patient stood to use the broom to sweep up the shells however reports mild dizziness and declined bend over to use dust pan.  Patient ambulated HHA to sink to brush teeth and organize/clean up around the sink/counter, put pillow cases on 2 pillows while standing, and elastic shoe laces issued to improve independence with donning her shoes.  Patient required numerous rest breaks due to reports of dizziness.  Focus session on forced use of affected nondominant LUE/hand, dynamic balance and activity tolerance.  Therapy Documentation Precautions:  Precautions Precautions: Fall Precaution Comments: L hemiparesis, h/o "vertigo" disorder per pt Restrictions Weight Bearing Restrictions: No Pain: Pain Assessment Pain Assessment: No/denies pain  See FIM for current functional status  Therapy/Group: Individual Therapy  Whitley Patchen 05/23/2012, 3:49 PM

## 2012-05-23 NOTE — Progress Notes (Signed)
Social Work Assessment and Plan Social Work Assessment and Plan  Patient Details  Name: Lindsey [name] MRN: [id number] Date of Birth: 02/21/1961  Today's Date: 05/23/2012  Problem List:  Patient Active Problem List  Diagnosis  . HYPERLIPIDEMIA  . OBESITY, MORBID  . Essential hypertension, benign  . CVA  . GASTROINTESTINAL DISORDER, FUNCTIONAL  . ARNOLD-CHIARI MALFORMATION  . VERTIGO  . SLEEP APNEA  . HEADACHE  . Other dyspnea and respiratory abnormality  . CHEST PAIN UNSPECIFIED  . EMESIS  . DIARRHEA  . CONTRAST DYE ALLERGY  . BACK PAIN, LUMBAR, WITH RADICULOPATHY  . Neuropathy  . Well woman exam  . Ear pain  . Acute kidney injury  . Stroke   Past Medical History:  Past Medical History  Diagnosis Date  . Hypertension   . Chronic kidney disease   . Neuropathy   . Sleep apnea   . Chiari I malformation   . Hyperlipidemia   . CVA (cerebral infarction)   . GERD (gastroesophageal reflux disease)   . Migraine   . Daily headache     "lately" (05/18/12)  . Stroke 2008;  ?2009;     "twice; once   Past Surgical History:  Past Surgical History  Procedure Date  . Reduction mammaplasty ~ 2002  . Cesarean section 7829; 1983; 1989  . Craniectomy suboccipital w/ cervical laminectomy / chiari 2000's  . Brain surgery    Social History:  reports that she has never smoked. She has never used smokeless tobacco. She reports that she does not drink alcohol or use illicit drugs.  Family / Support Systems Marital Status: Divorced Patient Roles: Parent Children: Daughter and two sons local Other Supports: Siblings Anticipated Caregiver: Patient and children Ability/Limitations of Caregiver: Children work and siblings can check on Caregiver Availability: Evenings only Family Dynamics: Close knit family who assist one another.  All are local and are there for one another.  Large family of numerous siblings.  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  She reprots it has been stressful.  She plans to work hard and recover to where she can do for herself.  This is her goal while here. Recent Psychosocial Issues: Other medical issues and loss of her brother and Father Pyschiatric History: No hisotry but reprots it has been hard on her.  May benefit from seeing Neuro-psych while here for coping. Substance Abuse History: No issues  Patient / Family Perceptions, Expectations & Goals Pt/Family understanding of illness & functional limitations: Pt is able to expalin her stroke she has had others on the same side.  She is hopeful she will do well and become independent again.  She doesn't want to burden her family. Premorbid pt/family roles/activities: Mother, Sister, Retiree, Home owner, Churhc member, etc Anticipated changes in roles/activities/participation: Resume Pt/family expectations/goals: Pt states: " I want to be able to do for myself before I leave here."  She is realistic and a Chief Executive Officer.  Community Resources [employer]: None Premorbid Home Care/DME Agencies: None Transportation available at discharge: Family members-quit driving due to vertigo  Resource referrals recommended: Support group (specify) (CVA Support group & Grief Group)  Discharge Planning Support Systems: Children;Other relatives;Friends/neighbors;Church/faith community Type of Residence: Private residence Insurance  Resources: [employer] Financial Resources: [facility] Financial Screen Referred: No Living Expenses: Lives with family Money Management: Patient Do you have any problems obtaining your medications?: No Home Management: Pateint and Niece Patient/Family Preliminary Plans: Pt would like to go to her home.  Her neice is staying there for now.  Her daughter and son have offered for her to come to their homes.  They do work and there would be times she would be alone.  Needs to be safe to be alone at times. Social Work Anticipated Follow Up Needs: HH/OP;Support Group  Clinical Impression Pleasant female who is motivated and wants to overcome her vertigo, has had issues with this for years.  Her family is supportive and willing to assist her, but can not provide 24 hour care at discharge. Pt needs to be safe to be alone during the day while family works.  May need some assistance with her coping due to multiple losses in a short period of time of family members.  May benefit from Neuro-psych follow while here.  Lucy Chris 05/23/2012, 12:23 PM

## 2012-05-23 NOTE — Progress Notes (Signed)
Patient ID: Lindsey [name], female   DOB: 27-Apr-1961, 51 y.o.   MRN: [id number] 51 y.o. 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 Patient was independent prior to admission without the use of assistive device. Admitted 05/18/2012 after a fall and increased left-sided weakness. MRI of the brain showed acute infarct right internal capsule right thalamus and small area of acute infarct in the left insula and left deep frontal white matter. Patient was on aspirin prior to admission and aggrenox was added  Subjective/Complaints:   Objective: Vital Signs: Blood pressure 137/86, pulse 60, temperature 98.2 F (36.8 C), temperature source Oral, resp. rate 18, height 4\' 11"  (1.499 m), weight 80.1 kg (176 lb 9.4 oz), last menstrual period 05/11/2012, SpO2 97.00%. No results found. Results for orders placed during the hospital encounter of 05/20/12 (from the past 72 hour(s))  BASIC METABOLIC PANEL     Status: Abnormal   Collection Time   05/22/12  8:49 AM      Component Value Range Comment   Sodium 137  135 - 145 mEq/L    Potassium 4.6  3.5 - 5.1 mEq/L    Chloride 104  96 - 112 mEq/L    CO2 25  19 - 32 mEq/L    Glucose, Bld 86  70 - 99 mg/dL    BUN 21  6 - 23 mg/dL    Creatinine, Ser 3.87 (*) 0.50 - 1.10 mg/dL    Calcium 9.1  8.4 - 56.4 mg/dL    GFR calc non Af Amer 42 (*) >90 mL/min    GFR calc Af Amer 48 (*) >90 mL/min   COMPREHENSIVE METABOLIC PANEL     Status: Abnormal   Collection Time   05/23/12  5:00 AM      Component Value Range Comment   Sodium 136  135 - 145 mEq/L    Potassium 4.3  3.5 - 5.1 mEq/L    Chloride 103  96 - 112 mEq/L    CO2 24  19 - 32 mEq/L    Glucose, Bld 80  70 - 99 mg/dL    BUN 19  6 - 23 mg/dL    Creatinine, Ser 3.32 (*) 0.50 - 1.10 mg/dL    Calcium 8.8  8.4 - 95.1 mg/dL    Total Protein 6.5  6.0 - 8.3 g/dL    Albumin 3.0 (*) 3.5 - 5.2 g/dL    AST 13  0 - 37 U/L    ALT 13  0 - 35 U/L    Alkaline Phosphatase 69  39 - 117 U/L    Total Bilirubin 0.3  0.3 - 1.2 mg/dL    GFR calc non Af Amer 48 (*) >90 mL/min    GFR calc Af Amer 55 (*) >90 mL/min   CBC WITH DIFFERENTIAL     Status: Abnormal   Collection Time   05/23/12  5:00 AM      Component Value Range Comment   WBC 8.2  4.0 - 10.5 K/uL    RBC 3.77 (*) 3.87 - 5.11 MIL/uL    Hemoglobin 10.9 (*) 12.0 - 15.0 g/dL    HCT 88.4 (*) 16.6 - 46.0 %    MCV 80.9  78.0 - 100.0 fL    MCH 28.9  26.0 - 34.0 pg    MCHC 35.7  30.0 - 36.0 g/dL    RDW 06.3 (*)  11.5 - 15.5 %    Platelets 282  150 - 400 K/uL    Neutrophils Relative 30 (*) 43 - 77 %    Neutro Abs 2.5  1.7 - 7.7 K/uL    Lymphocytes Relative 56 (*) 12 - 46 %    Lymphs Abs 4.6 (*) 0.7 - 4.0 K/uL    Monocytes Relative 6  3 - 12 %    Monocytes Absolute 0.5  0.1 - 1.0 K/uL    Eosinophils Relative 6 (*) 0 - 5 %    Eosinophils Absolute 0.5  0.0 - 0.7 K/uL    Basophils Relative 1  0 - 1 %    Basophils Absolute 0.1  0.0 - 0.1 K/uL      HEENT: normal Cardio: RRR Resp: CTA B/L GI: BS positive Extremity:  No Edema Skin:   Intact Neuro: Alert/Oriented, Cranial Nerve II-XII normal, Abnormal Motor 3-/5 L Delt, Bi, Tri, grip, HF, KE and Ankle DF and Abnormal FMC Ataxic/ dec FMC Musc/Skel:  Normal Gen:  NAD Mood affect flat  Assessment/Plan: 1. Functional deficits secondary to right internal capsule, thalamus and left insula and deep frontal white matter infarcts, likely thrombotic   which require 3+ hours per day of interdisciplinary therapy in a comprehensive inpatient rehab setting. Physiatrist is providing close team supervision and 24 hour management of active medical problems listed below. Physiatrist and rehab team continue to assess barriers to discharge/monitor patient progress toward functional and medical goals. FIM: FIM - Bathing Bathing Steps Patient Completed: Chest;Right Arm;Left  Arm;Front perineal area;Abdomen;Right upper leg;Left upper leg Bathing: 3: Mod-Patient completes 5-7 20f 10 parts or 50-74%  FIM - Upper Body Dressing/Undressing Upper body dressing/undressing steps patient completed: Thread/unthread right sleeve of pullover shirt/dresss;Put head through opening of pull over shirt/dress;Pull shirt over trunk Upper body dressing/undressing: 4: Min-Patient completed 75 plus % of tasks FIM - Lower Body Dressing/Undressing Lower body dressing/undressing steps patient completed: Thread/unthread right pants leg;Pull pants up/down;Thread/unthread right underwear leg;Pull underwear up/down Lower body dressing/undressing: 2: Max-Patient completed 25-49% of tasks  FIM - Toileting Toileting steps completed by patient: Adjust clothing prior to toileting;Performs perineal hygiene;Adjust clothing after toileting Toileting Assistive Devices: Grab bar or rail for support Toileting: 4: Steadying assist  FIM - Diplomatic Services operational officer Devices: Grab bars Toilet Transfers: 4-To toilet/BSC: Min A (steadying Pt. > 75%);4-From toilet/BSC: Min A (steadying Pt. > 75%)  FIM - Bed/Chair Transfer Bed/Chair Transfer Assistive Devices: Arm rests Bed/Chair Transfer: 4: Bed > Chair or W/C: Min A (steadying Pt. > 75%);4: Chair or W/C > Bed: Min A (steadying Pt. > 75%)  FIM - Locomotion: Wheelchair Distance: 30 Locomotion: Wheelchair: 1: Travels less than 50 ft with moderate assistance (Pt: 50 - 74%) FIM - Locomotion: Ambulation Locomotion: Ambulation Assistive Devices: Other (comment) (None) Ambulation/Gait Assistance: 2: Max assist Locomotion: Ambulation: 1: Travels less than 50 ft with maximal assistance (Pt: 25 - 49%)  Comprehension Comprehension Mode: Auditory Comprehension: 5-Understands basic 90% of the time/requires cueing < 10% of the time  Expression Expression Mode: Verbal Expression: 5-Expresses basic needs/ideas: With no assist  Social  Interaction Social Interaction: 6-Interacts appropriately with others with medication or extra time (anti-anxiety, antidepressant).  Problem Solving Problem Solving: 5-Solves basic 90% of the time/requires cueing < 10% of the time  Memory Memory: 5-Recognizes or recalls 90% of the time/requires cueing < 10% of the time  Medical Problem List and Plan:  1. DVT Prophylaxis/Anticoagulation: Pharmaceutical: Lovenox  2. Pain Management: N/C  3. Mood:  Continue Zoloft 50mg . Flat affect noted. Order neuropsych and have LCSW follow for formal evaluation.  4. Neuropsych: This patient is capable of making decisions on his/her own behalf.  5. HTN: monitor with bid checks. Continue Norvasc, HCTZ and metoprolol. Monitor hydration status on diuretics. Reduce HCTZ due to elevated BUN/Creat.  D/C IV with corrected E lytes and renal status 6. Migraine: continue tramadol for chronic headaches.  7: Hyperlipidemia: On lipitor daily.  8. CKD: at baseline 9. Constipation: . Marland Kitchen Start miralax, D/C reglan and monitor   LOS (Days) 3 A FACE TO FACE EVALUATION WAS PERFORMED  Dalal Livengood E 05/23/2012, 8:46 AM

## 2012-05-23 NOTE — Progress Notes (Signed)
Patient ID: NESREEN ALBANO, female   DOB: 29-Jan-1961, 51 y.o.   MRN: [id number] 51 y.o. 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 Patient was independent prior to admission without the use of assistive device. Admitted 05/18/2012 after a fall and increased left-sided weakness. MRI of the brain showed acute infarct right internal capsule right thalamus and small area of acute infarct in the left insula and left deep frontal white matter. Patient was on aspirin prior to admission and aggrenox was added  Subjective/Complaints: No new concerns. No SOB  Review of Systems  Constitutional: Negative for fever.  Neurological: Positive for sensory change and focal weakness. Negative for seizures.  Psychiatric/Behavioral: Negative for depression.  All other systems reviewed and are negative.    Objective: Vital Signs: Blood pressure 143/85, pulse 58, temperature 98.1 F (36.7 C), temperature source Oral, resp. rate 17, height 4\' 11"  (1.499 m), weight 80.1 kg (176 lb 9.4 oz), last menstrual period 05/11/2012, SpO2 97.00%. No results found. Results for orders placed during the hospital encounter of 05/20/12 (from the past 72 hour(s))  BASIC METABOLIC PANEL     Status: Abnormal   Collection Time   05/22/12  8:49 AM      Component Value Range Comment   Sodium 137  135 - 145 mEq/L    Potassium 4.6  3.5 - 5.1 mEq/L    Chloride 104  96 - 112 mEq/L    CO2 25  19 - 32 mEq/L    Glucose, Bld 86  70 - 99 mg/dL    BUN 21  6 - 23 mg/dL    Creatinine, Ser 1.19 (*) 0.50 - 1.10 mg/dL    Calcium 9.1  8.4 - 14.7 mg/dL    GFR calc non Af Amer 42 (*) >90 mL/min    GFR calc Af Amer 48 (*) >90 mL/min   COMPREHENSIVE METABOLIC PANEL     Status: Abnormal   Collection Time   05/23/12  5:00 AM      Component Value Range Comment   Sodium 136  135 - 145 mEq/L    Potassium 4.3  3.5 - 5.1 mEq/L    Chloride 103  96 - 112 mEq/L    CO2 24  19 - 32 mEq/L    Glucose, Bld 80  70 - 99 mg/dL    BUN 19  6 - 23 mg/dL    Creatinine, Ser 8.29 (*) 0.50 - 1.10 mg/dL    Calcium 8.8  8.4 - 56.2 mg/dL    Total Protein 6.5  6.0 - 8.3 g/dL    Albumin 3.0 (*) 3.5 - 5.2 g/dL    AST 13  0 - 37 U/L    ALT 13  0 - 35 U/L    Alkaline Phosphatase 69  39 - 117 U/L    Total Bilirubin 0.3  0.3 - 1.2 mg/dL    GFR calc non Af Amer 48 (*) >90 mL/min    GFR calc Af Amer 55 (*) >90 mL/min   CBC WITH DIFFERENTIAL     Status: Abnormal   Collection Time   05/23/12  5:00 AM      Component Value Range Comment   WBC 8.2  4.0 - 10.5 K/uL    RBC 3.77 (*) 3.87 - 5.11 MIL/uL    Hemoglobin 10.9 (*) 12.0 - 15.0 g/dL    HCT 13.0 (*)  36.0 - 46.0 %    MCV 80.9  78.0 - 100.0 fL    MCH 28.9  26.0 - 34.0 pg    MCHC 35.7  30.0 - 36.0 g/dL    RDW 16.1 (*) 09.6 - 15.5 %    Platelets 282  150 - 400 K/uL    Neutrophils Relative 30 (*) 43 - 77 %    Neutro Abs 2.5  1.7 - 7.7 K/uL    Lymphocytes Relative 56 (*) 12 - 46 %    Lymphs Abs 4.6 (*) 0.7 - 4.0 K/uL    Monocytes Relative 6  3 - 12 %    Monocytes Absolute 0.5  0.1 - 1.0 K/uL    Eosinophils Relative 6 (*) 0 - 5 %    Eosinophils Absolute 0.5  0.0 - 0.7 K/uL    Basophils Relative 1  0 - 1 %    Basophils Absolute 0.1  0.0 - 0.1 K/uL      HEENT: normal Cardio: RRR Resp: CTA B/L GI: BS positive Extremity:  No Edema Skin:   Intact Neuro: Alert/Oriented, Cranial Nerve II-XII normal, Abnormal Motor 3-/5 L Delt, Bi, Tri, grip, HF, KE and Ankle DF and Abnormal FMC Ataxic/ dec FMC Musc/Skel:  Normal Gen:  NAD Mood affect flat  Assessment/Plan: 1. Functional deficits secondary to right internal capsule, thalamus and left insula and deep frontal white matter infarcts, likely thrombotic   which require 3+ hours per day of interdisciplinary therapy in a comprehensive inpatient rehab setting. Physiatrist is providing close team supervision  and 24 hour management of active medical problems listed below. Physiatrist and rehab team continue to assess barriers to discharge/monitor patient progress toward functional and medical goals. FIM: FIM - Bathing Bathing Steps Patient Completed: Chest;Right Arm;Left Arm;Front perineal area;Abdomen;Right upper leg;Left upper leg Bathing: 3: Mod-Patient completes 5-7 93f 10 parts or 50-74%  FIM - Upper Body Dressing/Undressing Upper body dressing/undressing steps patient completed: Thread/unthread right sleeve of pullover shirt/dresss;Put head through opening of pull over shirt/dress;Pull shirt over trunk Upper body dressing/undressing: 4: Min-Patient completed 75 plus % of tasks FIM - Lower Body Dressing/Undressing Lower body dressing/undressing steps patient completed: Thread/unthread right pants leg;Pull pants up/down;Thread/unthread right underwear leg;Pull underwear up/down Lower body dressing/undressing: 2: Max-Patient completed 25-49% of tasks  FIM - Toileting Toileting steps completed by patient: Performs perineal hygiene;Adjust clothing after toileting Toileting Assistive Devices: Grab bar or rail for support Toileting: 4: Steadying assist  FIM - Diplomatic Services operational officer Devices: Grab bars;Walker (L hand orthosis on RW) Toilet Transfers: 4-From toilet/BSC: Min A (steadying Pt. > 75%)  FIM - Bed/Chair Transfer Bed/Chair Transfer Assistive Devices: Walker;Orthosis (L hand orthosis on RW) Bed/Chair Transfer: 4: Bed > Chair or W/C: Min A (steadying Pt. > 75%);4: Chair or W/C > Bed: Min A (steadying Pt. > 75%);5: Supine > Sit: Supervision (verbal cues/safety issues);5: Sit > Supine: Supervision (verbal cues/safety issues)  FIM - Locomotion: Wheelchair Distance: 25' Locomotion: Wheelchair: 1: Travels less than 50 ft with minimal assistance (Pt.>75%) FIM - Locomotion: Ambulation Locomotion: Ambulation Assistive Devices: Walker - Rolling;Orthosis (L hand orthosis on  RW) Ambulation/Gait Assistance: 4: Min assist Locomotion: Ambulation: 2: Travels 50 - 149 ft with minimal assistance (Pt.>75%)  Comprehension Comprehension Mode: Auditory Comprehension: 5-Understands basic 90% of the time/requires cueing < 10% of the time  Expression Expression Mode: Verbal Expression: 5-Expresses basic needs/ideas: With no assist  Social Interaction Social Interaction: 6-Interacts appropriately with others with medication or extra time (anti-anxiety, antidepressant).  Problem  Solving Problem Solving: 5-Solves basic 90% of the time/requires cueing < 10% of the time  Memory Memory: 5-Recognizes or recalls 90% of the time/requires cueing < 10% of the time  Medical Problem List and Plan:  1. DVT Prophylaxis/Anticoagulation: Pharmaceutical: Lovenox  2. Pain Management: N/C  3. Mood: Continue Zoloft 50mg . Flat affect noted. Order neuropsych and have LCSW follow for formal evaluation.  4. Neuropsych: This patient is capable of making decisions on his/her own behalf.  5. HTN: monitor with bid checks. Continue Norvasc, HCTZ and metoprolol. Monitor hydration status on diuretics. Reduce HCTZ due to elevated BUN/Creat.  D/C IV with corrected E lytes and renal status 6. Migraine: continue tramadol for chronic headaches.  7: Hyperlipidemia: On lipitor daily.  8. CKD: at baseline 9. Constipation: . Marland Kitchen Start miralax, D/C reglan and monitor   LOS (Days) 3 A FACE TO FACE EVALUATION WAS PERFORMED  Rucha Wissinger E 05/23/2012, 4:19 PM

## 2012-05-23 NOTE — Progress Notes (Signed)
Declined eucerin cream to heels. Continent and incontinent of urine. Usually incontinent when calls to go to bathroom. Spot checked bladder scan this AM=54. Abdominal folds moist. Lindsey [name]

## 2012-05-23 NOTE — Progress Notes (Signed)
Physical Therapy Session Note  Patient Details  Name: Lindsey [name] MRN: [id number] Date of Birth: October 26, 1960  Today's Date: 05/23/2012 Time: 5784-6962 and 1400-1445 Time Calculation (min): 45 min and 45 min  Short Term Goals: Week 1:  PT Short Term Goal 1 (Week 1): Pt will consistently perform transfers with S only and safe technique PT Short Term Goal 2 (Week 1): Pt will ambulate 3' with Min A and LRAD PT Short Term Goal 3 (Week 1): Pt will demonstrate dynamic standing balance with Min A PT Short Term Goal 4 (Week 1): Pt will perform bed mobility with Mod I   Skilled Therapeutic Interventions/Progress Updates:   AM session: Patient received sitting on toilet in bathroom in hospital gown. Patient states that her daughter bought her a new shirt and pants to wear during therapy. PT assisted with doffing of gown and patient required assistance donning shirt secondary to IV fluids in place. PT assists with placing patient's legs through briefs and pants while seated on toilet. Patient stands from standard toilet with use of grab bar on R side and rolling walker with L orthosis for hand grip with min assist. Patient able to adjust clothing and complete lower body dressing independently with steadying assist provided by PT. Patient ambulated from bathroom to sit on edge of bed with rolling walker with L orthosis for hand grip and min assist. Patient performed bed>w/c transfer with rolling walker with hand orthosis with min assist. BERG Balance assessment performed, see details below.  PM session: Patient instructed in gait training x100' with rolling walker with L hand orthosis and min assist. Patient exhibits good L foot clearance and placement. Patient demonstrates decreased weight bearing on L LE, decreased step and stride length, and slight forward flexed posture. Patient c/o of mild dizziness at end of gait training upon returning to sitting in wheelchair. RN available to take vitals; vitals  stable. Patient reports this dizziness was present at baseline. Patient given ice chips and reports decreased dizziness after 2 min rest break. Patient negotiated 8 steps with B handrails and min assist. Patient required verbal and tactile cues for proper sequencing and technique to ascend leading with R LE and descend leading with L LE. Patient instructed in gait training x10' with rolling walker and L orthosis to treatment mat. Patient sit>supine with supervision. Patient able to roll left and right with supervision. Patient performed 2 sets x10 reps bridges. Patient rolled supine > L side > prone and performed prone on elbows x30" to increase weightbearing through L UE. Attempted to achieve quadruped position, but unable secondary to patient's c/o back pain. Patient transitioned prone > L side> supine> sit with supervision. Patient transferred mat> wheelchair with rolling walker with L orthosis and min assist. Patient propelled herself in wheelchair x25' with min assist for obstacle negotiation.  Therapy Documentation Precautions:  Precautions Precautions: Fall Precaution Comments: L hemiparesis, h/o "vertigo" disorder per pt Restrictions Weight Bearing Restrictions: No  Pain: Pain Assessment Pain Assessment: No/denies pain  Balance: Standardized Balance Assessment Standardized Balance Assessment: Berg Balance Test Berg Balance Test Sit to Stand: Able to stand without using hands and stabilize independently Standing Unsupported: Able to stand 2 minutes with supervision Sitting with Back Unsupported but Feet Supported on Floor or Stool: Able to sit safely and securely 2 minutes Stand to Sit: Sits safely with minimal use of hands Transfers: Needs one person to assist Standing Unsupported with Eyes Closed: Able to stand 10 seconds with supervision Standing Ubsupported with Feet  Together: Able to place feet together independently and stand for 1 minute with supervision From Standing, Reach  Forward with Outstretched Arm: Can reach forward >12 cm safely (5") From Standing Position, Pick up Object from Floor: Able to pick up shoe, needs supervision From Standing Position, Turn to Look Behind Over each Shoulder: Turn sideways only but maintains balance Turn 360 Degrees: Needs close supervision or verbal cueing Standing Unsupported, Alternately Place Feet on Step/Stool: Needs assistance to keep from falling or unable to try Standing Unsupported, One Foot in Front: Able to take small step independently and hold 30 seconds Standing on One Leg: Tries to lift leg/unable to hold 3 seconds but remains standing independently Total Score: 34   See FIM for current functional status  Therapy/Group: Individual Therapy  Zella Richer [place] 05/23/2012, 12:42 PM

## 2012-05-23 NOTE — Progress Notes (Signed)
Patient information reviewed and entered into eRehab system by Ceclia Koker, RN, CRRN, PPS Coordinator.  Information including medical coding and functional independence measure will be reviewed and updated through discharge.     Per nursing patient was given "Data Collection Information Summary for Patients in Inpatient Rehabilitation Facilities with attached "Privacy Act Statement-Health Care Records" upon admission.  

## 2012-05-23 NOTE — Care Management Note (Signed)
Inpatient Rehabilitation Center Individual Statement of Services  Patient Name:  Lindsey [name]  Date:  05/23/2012  Welcome to the Inpatient Rehabilitation Center.  Our goal is to provide you with an individualized program based on your diagnosis and situation, designed to meet your specific needs.  With this comprehensive rehabilitation program, you will be expected to participate in at least 3 hours of rehabilitation therapies Monday-Friday, with modified therapy programming on the weekends.  Your rehabilitation program will include the following services:  Physical Therapy (PT), Occupational Therapy (OT), Speech Therapy (ST), 24 hour per day rehabilitation nursing, Therapeutic Recreaction (TR), Neuropsychology, Case Management (RN and Social Worker), Rehabilitation Medicine, Nutrition Services and Pharmacy Services  Weekly team conferences will be held on Wednesday to discuss your progress.  Your RN Case Designer, television/film set will talk with you frequently to get your input and to update you on team discussions.  Team conferences with you and your family in attendance may also be held.  Expected length of stay: 2 weeks Overall anticipated outcome: Supervision/mod/i level  Depending on your progress and recovery, your program may change.  Your RN Case Estate agent will coordinate services and will keep you informed of any changes.  Your RN Sports coach and SW names and contact numbers are listed  below.  The following services may also be recommended but are not provided by the Inpatient Rehabilitation Center:   Driving Evaluations  Home Health Rehabiltiation Services  Outpatient Rehabilitatation [facility]  Vocational Rehabilitation   Arrangements will be made to provide these services after discharge if needed.  Arrangements include referral to agencies that provide these services.  Your insurance has been verified to be:  Medicare Your primary doctor is:  Dr Rodman Pickle  Pertinent information will be shared with your doctor and your insurance company.   Social Worker:  Dossie Der, [place] [phone]  Information discussed with and copy given to patient by: Lucy Chris, 05/23/2012, 12:02 PM

## 2012-05-23 NOTE — Progress Notes (Signed)
Physical Therapy Session Note  Patient Details  Name: Lindsey [name] MRN: [id number] Date of Birth: March 08, 1961  Today's Date: 05/23/2012 Time: 4098-1191 Time Calculation (min): 28 min  Short Term Goals: Week 1:  PT Short Term Goal 1 (Week 1): Pt will consistently perform transfers with S only and safe technique PT Short Term Goal 2 (Week 1): Pt will ambulate 92' with Min A and LRAD PT Short Term Goal 3 (Week 1): Pt will demonstrate dynamic standing balance with Min A PT Short Term Goal 4 (Week 1): Pt will perform bed mobility with Mod I   Skilled Therapeutic Interventions/Progress Updates:    Began to assess "dizziness" related deficits as pt reports this is a major inhibitor to quality of life. Pt scored a 68 on the Dizziness Handicap Inventory (> 54 = severe handicap functionally, physically, and emotionally from her dizziness).   Eye Alignment Appears WFL  Spontaneous  Nystagmus Negative  Gaze holding nystagmus Negative  Smooth pursuit Slightly impaired  Oculomotor WFL  Saccades Impaired  Head Thrust Test Negative  Head Shaking Nystagmus Not tested  Rt. [employer] * This and below not tested   Lt. Hallpike Dix   Rt. Roll Test   Lt. Roll Test    Motion sensitivity       Visual- Vestibular Interactions:  - Sitting - Positive in horizontal and vertical planes.   Pt appears to have central deficits from prior strokes and potentially from Chiari Malformation. Her Chiari Malformation may also be causing her vertigo particularly if her nystagmus turns out to have a downbeating component. Pt not tested today secondary to time constraints and needing to clear with MD about positional testing with Chiari malformation.   Current Recommendations:  1. x1 VOR exercises as tolerated with </= 3/10 increment increase in dizziness. Pt should perform in horizontal and vertical planes while seated. Pt given handout and started on home program.  2. Saccade exercises - pt not started on  these yet. Pt will benefit from introducing these as a progression.  3. Visual targeting? Unable to attempt this session with mobility however pt reports she has not attempted to utilize compensatory technique.   3. Pt will benefit from positional testing IF MD clears pt given her Chiari Malformation.      Therapy Documentation Precautions:  Precautions Precautions: Fall Precaution Comments: L hemiparesis, h/o "vertigo" disorder per pt Restrictions Weight Bearing Restrictions: No Pain: Pain Assessment Pain Assessment: No/denies pain  See FIM for current functional status  Therapy/Group: Individual Therapy  Wilhemina Bonito 05/23/2012, 4:49 PM

## 2012-05-24 ENCOUNTER — Inpatient Hospital Stay (HOSPITAL_COMMUNITY): Payer: Medicare Other | Admitting: *Deleted

## 2012-05-24 ENCOUNTER — Inpatient Hospital Stay (HOSPITAL_COMMUNITY): Payer: Medicare Other | Admitting: Physical Therapy

## 2012-05-24 ENCOUNTER — Inpatient Hospital Stay (HOSPITAL_COMMUNITY): Payer: Medicare Other | Admitting: Occupational Therapy

## 2012-05-24 DIAGNOSIS — I6330 Cerebral infarction due to thrombosis of unspecified cerebral artery: Secondary | ICD-10-CM

## 2012-05-24 DIAGNOSIS — G8110 Spastic hemiplegia affecting unspecified side: Secondary | ICD-10-CM

## 2012-05-24 MED ORDER — POLYETHYLENE GLYCOL 3350 17 G PO PACK
17.0000 g | PACK | Freq: Two times a day (BID) | ORAL | Status: DC
  Administered 2012-05-26 – 2012-05-30 (×5): 17 g via ORAL
  Filled 2012-05-24 (×17): qty 1

## 2012-05-24 NOTE — Progress Notes (Signed)
Patient ID: Lindsey [name], female   DOB: Nov 26, 1960, 51 y.o.   MRN: [id number] 51 y.o. 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 Patient was independent prior to admission without the use of assistive device. Admitted 05/18/2012 after a fall and increased left-sided weakness. MRI of the brain showed acute infarct right internal capsule right thalamus and small area of acute infarct in the left insula and left deep frontal white matter. Patient was on aspirin prior to admission and aggrenox was added  Subjective/Complaints: No new concerns. No SOB Tolerating therapy Review of Systems  Constitutional: Negative for fever.  Neurological: Positive for sensory change and focal weakness. Negative for seizures.  Psychiatric/Behavioral: Negative for depression.  All other systems reviewed and are negative.    Objective: Vital Signs: Blood pressure 141/93, pulse 65, temperature 98.1 F (36.7 C), temperature source Oral, resp. rate 18, height 4\' 11"  (1.499 m), weight 80.1 kg (176 lb 9.4 oz), last menstrual period 05/11/2012, SpO2 96.00%. No results found. Results for orders placed during the hospital encounter of 05/20/12 (from the past 72 hour(s))  BASIC METABOLIC PANEL     Status: Abnormal   Collection Time   05/22/12  8:49 AM      Component Value Range Comment   Sodium 137  135 - 145 mEq/L    Potassium 4.6  3.5 - 5.1 mEq/L    Chloride 104  96 - 112 mEq/L    CO2 25  19 - 32 mEq/L    Glucose, Bld 86  70 - 99 mg/dL    BUN 21  6 - 23 mg/dL    Creatinine, Ser 4.09 (*) 0.50 - 1.10 mg/dL    Calcium 9.1  8.4 - 81.1 mg/dL    GFR calc non Af Amer 42 (*) >90 mL/min    GFR calc Af Amer 48 (*) >90 mL/min   COMPREHENSIVE METABOLIC PANEL     Status: Abnormal   Collection Time   05/23/12  5:00 AM      Component Value Range Comment   Sodium 136  135  - 145 mEq/L    Potassium 4.3  3.5 - 5.1 mEq/L    Chloride 103  96 - 112 mEq/L    CO2 24  19 - 32 mEq/L    Glucose, Bld 80  70 - 99 mg/dL    BUN 19  6 - 23 mg/dL    Creatinine, Ser 9.14 (*) 0.50 - 1.10 mg/dL    Calcium 8.8  8.4 - 78.2 mg/dL    Total Protein 6.5  6.0 - 8.3 g/dL    Albumin 3.0 (*) 3.5 - 5.2 g/dL    AST 13  0 - 37 U/L    ALT 13  0 - 35 U/L    Alkaline Phosphatase 69  39 - 117 U/L    Total Bilirubin 0.3  0.3 - 1.2 mg/dL    GFR calc non Af Amer 48 (*) >90 mL/min    GFR calc Af Amer 55 (*) >90 mL/min   CBC WITH DIFFERENTIAL     Status: Abnormal   Collection Time   05/23/12  5:00 AM      Component Value Range Comment   WBC 8.2  4.0 - 10.5 K/uL    RBC 3.77 (*) 3.87 - 5.11 MIL/uL    Hemoglobin 10.9 (*) 12.0 - 15.0 g/dL    HCT  30.5 (*) 36.0 - 46.0 %    MCV 80.9  78.0 - 100.0 fL    MCH 28.9  26.0 - 34.0 pg    MCHC 35.7  30.0 - 36.0 g/dL    RDW 82.9 (*) 56.2 - 15.5 %    Platelets 282  150 - 400 K/uL    Neutrophils Relative 30 (*) 43 - 77 %    Neutro Abs 2.5  1.7 - 7.7 K/uL    Lymphocytes Relative 56 (*) 12 - 46 %    Lymphs Abs 4.6 (*) 0.7 - 4.0 K/uL    Monocytes Relative 6  3 - 12 %    Monocytes Absolute 0.5  0.1 - 1.0 K/uL    Eosinophils Relative 6 (*) 0 - 5 %    Eosinophils Absolute 0.5  0.0 - 0.7 K/uL    Basophils Relative 1  0 - 1 %    Basophils Absolute 0.1  0.0 - 0.1 K/uL      HEENT: normal Cardio: RRR Resp: CTA B/L GI: BS positive Extremity:  No Edema Skin:   Intact Neuro: Alert/Oriented, Cranial Nerve II-XII normal, Abnormal Motor 3-/5 L Delt, Bi, Tri, grip, HF, KE and Ankle DF and Abnormal FMC Ataxic/ dec FMC Musc/Skel:  Normal Gen:  NAD Mood affect flat  Assessment/Plan: 1. Functional deficits secondary to right internal capsule, thalamus and left insula and deep frontal white matter infarcts, likely thrombotic   which require 3+ hours per day of interdisciplinary therapy in a comprehensive inpatient rehab setting. Physiatrist is providing close  team supervision and 24 hour management of active medical problems listed below. Physiatrist and rehab team continue to assess barriers to discharge/monitor patient progress toward functional and medical goals. FIM: FIM - Bathing Bathing Steps Patient Completed: Chest;Right Arm;Left Arm;Front perineal area;Abdomen;Right upper leg;Left upper leg Bathing: 3: Mod-Patient completes 5-7 16f 10 parts or 50-74%  FIM - Upper Body Dressing/Undressing Upper body dressing/undressing steps patient completed: Thread/unthread right sleeve of pullover shirt/dresss;Put head through opening of pull over shirt/dress;Pull shirt over trunk Upper body dressing/undressing: 4: Min-Patient completed 75 plus % of tasks FIM - Lower Body Dressing/Undressing Lower body dressing/undressing steps patient completed: Thread/unthread right pants leg;Pull pants up/down;Thread/unthread right underwear leg;Pull underwear up/down Lower body dressing/undressing: 2: Max-Patient completed 25-49% of tasks  FIM - Toileting Toileting steps completed by patient: Adjust clothing prior to toileting;Performs perineal hygiene;Adjust clothing after toileting Toileting Assistive Devices: Grab bar or rail for support Toileting: 4: Steadying assist  FIM - Diplomatic Services operational officer Devices: Grab bars;Walker (L hand orthosis on RW) Toilet Transfers: 4-From toilet/BSC: Min A (steadying Pt. > 75%)  FIM - Bed/Chair Transfer Bed/Chair Transfer Assistive Devices: Walker;Orthosis (L hand orthosis on RW) Bed/Chair Transfer: 4: Bed > Chair or W/C: Min A (steadying Pt. > 75%);4: Chair or W/C > Bed: Min A (steadying Pt. > 75%);5: Supine > Sit: Supervision (verbal cues/safety issues);5: Sit > Supine: Supervision (verbal cues/safety issues)  FIM - Locomotion: Wheelchair Distance: 25' Locomotion: Wheelchair: 1: Travels less than 50 ft with minimal assistance (Pt.>75%) FIM - Locomotion: Ambulation Locomotion: Ambulation Assistive  Devices: Walker - Rolling;Orthosis (L hand orthosis on RW) Ambulation/Gait Assistance: 4: Min assist Locomotion: Ambulation: 2: Travels 50 - 149 ft with minimal assistance (Pt.>75%)  Comprehension Comprehension Mode: Auditory Comprehension: 5-Understands basic 90% of the time/requires cueing < 10% of the time  Expression Expression Mode: Verbal Expression: 5-Expresses basic needs/ideas: With extra time/assistive device  Social Interaction Social Interaction: 6-Interacts appropriately with others with medication  or extra time (anti-anxiety, antidepressant).  Problem Solving Problem Solving: 5-Solves basic 90% of the time/requires cueing < 10% of the time  Memory Memory: 5-Recognizes or recalls 90% of the time/requires cueing < 10% of the time  Medical Problem List and Plan:  1. DVT Prophylaxis/Anticoagulation: Pharmaceutical: Lovenox  2. Pain Management: N/C  3. Mood: Continue Zoloft 50mg . Flat affect noted. Order neuropsych and have LCSW follow for formal evaluation.  4. Neuropsych: This patient is capable of making decisions on his/her own behalf.  5. HTN: monitor with bid checks. Continue Norvasc, HCTZ and metoprolol. Monitor hydration status on diuretics. Reduce HCTZ due to elevated BUN/Creat.  D/C IV with corrected E lytes and renal status 6. Migraine: continue tramadol for chronic headaches.  7: Hyperlipidemia: On lipitor daily.  8. CKD: at baseline 9. Constipation: . Marland Kitchen Start miralax, D/C reglan and monitor   LOS (Days) 4 A FACE TO FACE EVALUATION WAS PERFORMED  KIRSTEINS,ANDREW E 05/24/2012, 10:45 AM

## 2012-05-24 NOTE — Progress Notes (Signed)
Occupational Therapy Session Notes  Patient Details  Name: Lindsey [name] MRN: [id number] Date of Birth: Aug 11, 1960  Today's Date: 05/24/2012 Time: 1100-1200 and 145-215 Time Calculation (min): 60 min and 30 min  Short Term Goals: Week 1:  OT Short Term Goal 1 (Week 1): Pt will complete bathing at min assist level OT Short Term Goal 2 (Week 1): Pt will complete LB dressing with min assist OT Short Term Goal 3 (Week 1): Pt will complete toilet transfers with supervision with LRAD OT Short Term Goal 4 (Week 1): Pt will complete tub/shower transfers with min assist  Skilled Therapeutic Interventions/Progress Updates:  1)  Reports dizziness and nausea level at 7/10 following Vestibular Evaluation with PT.  Patient declined shower yet agreed to sponge bath and dress at sink.  Focus session on activity tolerance, sit><stand and sustained attention as patient required mod cues for redirection to stay on task.  Patient ambulated with RW to bathroom for transfer and toileting with close supervision-min assist.  Patient requested to stay in w/c.  2)  Reports dizziness level at 4/10 and nausea at 5/10.  Issued Thera putty and exercises and patient return demonstrated 10/10 exercises requiring mod vcs to stay on task  Therapy Documentation Precautions:  Precautions Precautions: Fall Precaution Comments: L hemiparesis, h/o "vertigo" disorder per pt Restrictions Weight Bearing Restrictions: No Pain: Denies pain  See FIM for current functional status  Therapy/Group: Individual Therapy  Grayton Lobo 05/24/2012, 12:04 PM

## 2012-05-24 NOTE — Plan of Care (Signed)
Problem: RH BOWEL ELIMINATION Goal: RH STG MANAGE BOWEL W/MEDICATION W/ASSISTANCE STG Manage Bowel with Medication with min assistance with use of scheduled and prn bowel medications Outcome: Progressing Bm today, refused miralax

## 2012-05-24 NOTE — Progress Notes (Signed)
Physical Therapy Session Note  Patient Details  Name: Lindsey [name] MRN: [id number] Date of Birth: 04-30-1961  Today's Date: 05/24/2012 Time: 4098-1191 Time Calculation (min): 45 min  Short Term Goals: Week 1:  PT Short Term Goal 1 (Week 1): Pt will consistently perform transfers with S only and safe technique PT Short Term Goal 2 (Week 1): Pt will ambulate 59' with Min A and LRAD PT Short Term Goal 3 (Week 1): Pt will demonstrate dynamic standing balance with Min A PT Short Term Goal 4 (Week 1): Pt will perform bed mobility with Mod I   Skilled Therapeutic Interventions/Progress Updates:    Patient received sitting on toilet. Patient performs sit to stand from standard toilet with use of R grab bar and rolling walker with L hand orthosis and min assist. Patient able to perform hygiene and adjust clothing independently while PT provides steadying assist. Patient ambulates bathroom>w/c (10') with rolling walker with L hand orthosis and min assist. Once seated in w/c, patient reports no pain, but c/o nausea. Patient reports that this nausea is baseline and is usually accompanied by her baseline dizziness. Patient agreeable to participate in therapy. Patient instructed in gait training x90' with rolling walker with L hand orthosis and min assist. Patient requires tactile cues for L hand grip and upright posture. Patient benefits from verbal cues to increase step length on the R LE to facilitate increased weight bearing on L LE. Patient ambulated 25'x1 with R handrail and min assist and 25'x1 with R handheld assist and min assist. Patient demonstrates increased lateral trunk sway and increased base of support, but is able to achieve comparable gait speed and step/stride lengths without use of rolling walker.   Patient performed 5 sit to stands from wheelchair with R LE positioned slightly forward and L LE positioned slightly backwards to facilitate increased weight bearing through L LE. Patient c/o  increased nausea with this activity and requires rest break. Patient transferred to NuStep with R handheld assist and min assist. Patient exercises on NuStep x6 minutes at level 1, maintaining 25-30 steps per minute. Patient required verbal cues to maintain L grip on handle. Patient transferred NuStep > w/c with R handheld assist and min assist. Patient returned to room and states that her nausea level has decreased. Patient left seated in w/c with tray in front of her, eating lunch with all needs within reach.  Therapy Documentation Precautions:  Precautions Precautions: Fall Precaution Comments: L hemiparesis, h/o "vertigo" disorder per pt Restrictions Weight Bearing Restrictions: No  Pain: Pain Assessment Pain Assessment: No/denies pain  Locomotion : Ambulation Ambulation/Gait Assistance: 4: Min assist Wheelchair Mobility Distance: 150   See FIM for current functional status  Therapy/Group: Individual Therapy  Zella Richer [place] 05/24/2012, 2:36 PM

## 2012-05-24 NOTE — Progress Notes (Signed)
Physical Therapy Session Note  Patient Details  Name: Lindsey [name] MRN: [id number] Date of Birth: 1960/10/17  Today's Date: 05/24/2012 Time: 4098-1191 Time Calculation (min): 55 min  Short Term Goals: Week 1:  PT Short Term Goal 1 (Week 1): Pt will consistently perform transfers with S only and safe technique PT Short Term Goal 2 (Week 1): Pt will ambulate 47' with Min A and LRAD PT Short Term Goal 3 (Week 1): Pt will demonstrate dynamic standing balance with Min A PT Short Term Goal 4 (Week 1): Pt will perform bed mobility with Mod I   Skilled Therapeutic Interventions/Progress Updates:    Session focused on finishing vestibular evaluation. (see note from yesterday for central evaluation). Discussed Chiari Malformation with MD. Since pt has had decompressive surgery MD felt ok with positional testing when proceeded with caution. Performed modified (to side lying) hallpike dix bilaterally, no nystagmus but pt with some c/o nausea, no room spinning. Performed full Lt. hallpike dix with decreased neck extension however pt with c/o headache starting so pulled out of testing position for safety. No nystagmus or room spinning noted. Practiced x1 VOR exercises, pt performs better with target at ~4" distance. Attempted to incorporate visual targeting during mobility, pt unable to maintain focus and attention for this to benefit her however may be worth practicing. 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   Current Recommendations:  1. x1 VOR exercises as tolerated with </= 3/10 increment increase in dizziness. Pt should perform in horizontal and vertical planes while seated. Pt given handout and started on home program.  2. Saccade exercises - pt not started on these yet. Pt will benefit from introducing these as a progression.  3. Visual targeting with frequent reminders throughout  movement.   3. Pt will benefit from follow up therapies at [facility] for Vestibular rehab.    Therapy Documentation Precautions:  Precautions Precautions: Fall Precaution Comments: L hemiparesis, h/o "vertigo" disorder per pt Restrictions Weight Bearing Restrictions: No Pain: Pain Assessment Pain Assessment: No/denies pain  See FIM for current functional status  Therapy/Group: Individual Therapy  Wilhemina Bonito 05/24/2012, 12:33 PM

## 2012-05-25 ENCOUNTER — Inpatient Hospital Stay (HOSPITAL_COMMUNITY): Payer: Medicare Other | Admitting: Occupational Therapy

## 2012-05-25 ENCOUNTER — Inpatient Hospital Stay (HOSPITAL_COMMUNITY): Payer: Medicare Other | Admitting: Physical Therapy

## 2012-05-25 ENCOUNTER — Inpatient Hospital Stay (HOSPITAL_COMMUNITY): Payer: Medicare Other | Admitting: *Deleted

## 2012-05-25 ENCOUNTER — Inpatient Hospital Stay (HOSPITAL_COMMUNITY): Payer: Medicare Other

## 2012-05-25 MED ORDER — MECLIZINE HCL 12.5 MG PO TABS
12.5000 mg | ORAL_TABLET | Freq: Two times a day (BID) | ORAL | Status: DC
  Administered 2012-05-25 – 2012-05-26 (×3): 12.5 mg via ORAL
  Filled 2012-05-25 (×6): qty 1

## 2012-05-25 NOTE — Evaluation (Signed)
Recreational Therapy Assessment and Plan  Patient Details  Name: Lindsey [name] MRN: [id number] Date of Birth: October 05, 1960 Today's Date: 05/25/2012  Rehab Potential: Good ELOS: 10 days   Assessment Clinical Impression:Problem List:  Patient Active Problem List   Diagnosis   .  HYPERLIPIDEMIA   .  OBESITY, MORBID   .  Essential hypertension, benign   .  CVA   .  GASTROINTESTINAL DISORDER, FUNCTIONAL   .  ARNOLD-CHIARI MALFORMATION   .  VERTIGO   .  SLEEP APNEA   .  HEADACHE   .  Other dyspnea and respiratory abnormality   .  CHEST PAIN UNSPECIFIED   .  EMESIS   .  DIARRHEA   .  CONTRAST DYE ALLERGY   .  BACK PAIN, LUMBAR, WITH RADICULOPATHY   .  Neuropathy   .  Well woman exam   .  Ear pain   .  Acute kidney injury   .  Stroke    Past Medical History:  Past Medical History   Diagnosis  Date   .  Hypertension    .  Chronic kidney disease    .  Neuropathy    .  Sleep apnea    .  Chiari I malformation    .  Hyperlipidemia    .  CVA (cerebral infarction)    .  GERD (gastroesophageal reflux disease)    .  Migraine    .  Daily headache      "lately" (05/18/12)   .  Stroke  2008; ?2009;     "twice; once    Past Surgical History:  Past Surgical History   Procedure  Date   .  Reduction mammaplasty  ~ 2002   .  Cesarean section  4098; 1983; 1989   .  Craniectomy suboccipital w/ cervical laminectomy / chiari  2000's   .  Brain surgery     Assessment & Plan  Clinical Impression: Lindsey [name] is a 51 y.o. 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 Patient was independent prior to admission without the use of assistive device. Admitted 05/18/2012 after a fall and increased left-sided weakness. MRI of the brain showed acute infarct right internal capsule right thalamus and small area of acute infarct in the left insula  and left deep frontal white matter. Patient was on aspirin prior to admission and aggrenox was added for admission. Neurology services consulted and recommends TEE/outpatient holter monitor. Patient changed to plavix for CVA prophylaxis. Carotid dopplers without ICA stenosis. Cardiac echo done today. Patient has has worsening of renal insufficiency and ACE d/c and IVF initiated for hydration due to rise in creatinine. Patient has had improvement in left sided weakness since last pm. Therapies ongoing and CIR recommended for progression. Patient transferred to CIR on 05/20/2012 .     Pt presents with decreased activity tolerance, decreased functional mobility, decreased balance, left sided weakness, decreased vision & h/o "vertigo disorder" limiting pt's independence with leisure/community pursuits.  Leisure History/Participation Premorbid leisure interest/current participation: Games - Research scientist (physical sciences) - Painting;Community - Agricultural consultant (Comment) (checkers, Agricultural consultant at [employer] place) Other Leisure Interests: Reading Leisure Participation Style: With Family/Friends Awareness of Community Resources: Good-identify 3 post discharge leisure resources Psychosocial / Spiritual Patient agreeable to Pet Therapy: Yes Does patient have pets?: Yes Social interaction - Mood/Behavior: Cooperative Film/video editor for Education?: Yes Patient Agreeable  to Outing?: Yes Recreational Therapy Orientation Orientation -Reviewed with patient: Available activity resources Strengths/Weaknesses Patient Strengths/Abilities: Willingness to participate;Active premorbidly Patient weaknesses: Physical limitations  Plan Rec Therapy Plan Is patient appropriate for Therapeutic Recreation?: Yes Rehab Potential: Good Treatment times per week: Min 1 time for community reintegration Estimated Length of Stay: 10 days TR Treatment/Interventions: Adaptive equipment instruction;Community  reintegration;Functional mobility training;Patient/family education;Therapeutic activities  Recommendations for other services: None  Discharge Criteria: Patient will be discharged from TR if patient refuses treatment 3 consecutive times without medical reason.  If treatment goals not met, if there is a change in medical status, if patient makes no progress towards goals or if patient is discharged from hospital.  The above assessment, treatment plan, treatment alternatives and goals were discussed and mutually agreed upon: by patient  Quanasia Defino 05/25/2012, 4:42 PM

## 2012-05-25 NOTE — Progress Notes (Signed)
Physical Therapy Session Note  Patient Details  Name: Lindsey [name] MRN: [id number] Date of Birth: 07-12-1960  Today's Date: 05/25/2012 Time: 9563-8756 Time Calculation (min): 45 min  Short Term Goals: Week 1:  PT Short Term Goal 1 (Week 1): Pt will consistently perform transfers with S only and safe technique PT Short Term Goal 2 (Week 1): Pt will ambulate 49' with Min A and LRAD PT Short Term Goal 3 (Week 1): Pt will demonstrate dynamic standing balance with Min A PT Short Term Goal 4 (Week 1): Pt will perform bed mobility with Mod I   Skilled Therapeutic Interventions/Progress Updates:    Patient received sitting in wheelchair in room. Patient reports her dizziness "is not that bad today." Patient instructed in gait training with [facility] x90' with min assist. Patient requires moderate verbal and tactile cues for proper sequencing and technique with LBQC. Patient exhibits decreased stance time on L LE, increased lateral trunk sway, decreased step and stride length, and decreased cadence. Patient instructed in gait training with rolling walker with L hand orthosis x100' with supervision-min assist. Patient requires min assist intermittently secondary to increased distractibility and decreased attention to task. As compared to gait training with [facility], patient exhibits increased stance time on L LE, decreased lateral trunk sway, increased step and stride length, and increased cadence. Patient states she feels "more steady" with rolling walker.   Patient negotiated 8 stairs with bilateral handrails and supervision. Patient required verbal cues for proper sequencing and technique regarding which LE to lead with when ascending and descending. After initial verbal cues, patient able to identify correct sequencing and technique without increased cues. Patient negotiated curb x4 with rolling walker with L hand orthosis and min assist. Patient requires verbal and tactile cues for proper sequencing and  technique and is unable to demonstrate carry over from stair training to curb negotiation and requires several verbal cues for which LE to lead with when ascending/descending curb. Patient also requires repeated cues to maintain BOS within rolling walker.   Patient instructed in wheelchair mobility x25 feet with mod assist. Patient requires verbal and tactile cues for proper sequencing and technique. Patient demonstrates decreased coordination and has difficulty sequencing using both UEs simultaneously. Patient has difficulty with gripping L wheel due to decreased grip strength. Will apply theraband to L wheel tomorrow to facilitate increased grip and propulsion ability. Patient left in room with all needs within reach.  Therapy Documentation Precautions:  Precautions Precautions: Fall Precaution Comments: L hemiparesis, h/o "vertigo" per pt Restrictions Weight Bearing Restrictions: No  Pain: Pain Assessment Pain Assessment: No/denies pain Pain Score:   3  Locomotion : Ambulation Ambulation/Gait Assistance: 4: Min assist Wheelchair Mobility Distance: 25   See FIM for current functional status  Therapy/Group: Individual Therapy  Zella Richer Kahleah Crass 05/25/2012, 3:02 PM

## 2012-05-25 NOTE — Progress Notes (Signed)
Physical Therapy Note  Patient Details  Name: Lindsey [name] MRN: [id number] Date of Birth: 1960-11-13 Today's Date: 05/25/2012  4098-1191 (55 minutes) individual Pain: no reported pain Focus of treatment: Therapeutic exercises for general activity tolerance; Neuro re-ed LT LE; gait training Treatment: Transfers stand/pivot with RW + hand splint LT SBA with tactile cues for placing hand in splint; NuStep Level 3 X 10 minutes; Gait - 80 feet RW + LT hand splint SBA; Sit to supine (mat) SBA ; Pt required vcs to roll to side first to perform side to sit with SBA (mat)  Neuro re-ed LT LE: ankle pumps x 15; LT heel slides X 10 with increased time ; resisted hip ER/IR in hooklying; up/down 4 inch step LT LE only 2 X 10 for quad/hip extension control in stance wc mobility- 120 feet mod assist with max tactile cues for changing direction secondary to decreased attention to Lt hand.  Jennalynn Rivard,JIM 05/25/2012, 7:31 AM

## 2012-05-25 NOTE — Progress Notes (Signed)
Occupational Therapy Session Notes  Patient Details  Name: Lindsey [name] MRN: [id number] Date of Birth: [birth year]  Today's Date: 05/25/2012 Time: 0800-0900 and 1000-1030 Time Calculation (min): 60 min and 30 min  Short Term Goals: Week 1:  OT Short Term Goal 1 (Week 1): Pt will complete bathing at min assist level OT Short Term Goal 2 (Week 1): Pt will complete LB dressing with min assist OT Short Term Goal 3 (Week 1): Pt will complete toilet transfers with supervision with LRAD OT Short Term Goal 4 (Week 1): Pt will complete tub/shower transfers with min assist  Skilled Therapeutic Interventions/Progress Updates:  1)  Patient up in w/c upon arrival.  Patient declined shower this morning, "I just don't feel like it and I already got cleaned up this morning".  Patient with a urinary incontinent episode this morning and nursing assisted patient with clean up and change clothes.  Patient agreed to finish washing up and grooming at the sink while standing then completed dressing in sit and stand.  While standing patient required mod-max vcs to shift weight laterally onto LLE during standing, forced use of LLE, and switched to lower seat so she could bend forward to don pants over feet and don right sock more independently.  Patient's Dizzy score: 4/10 before session, 6/10 after session and Nausea score:  5/10 before session, 7/10 after session.  Patient declined medication due to, "it makes me sick to my stomach"  Patient's daughter present for last 30 min of session and states that patient will come home with her initially.  2)  Patient's nausea score still 7/10 therefore performed LUE ROM, Coordination and Thera-putty exercises while seated.  Patient's sister, Dedra Skeens, present and stated that she will provide assist once patient discharges when patient's daughter is at work.  Therapy Documentation Precautions:  Precautions Precautions: Fall Precaution Comments: L hemiparesis, h/o "vertigo"  disorder per pt Restrictions Weight Bearing Restrictions: No Pain: 8/10 pain in left little toe, RN aware, declined medication. See FIM for current functional status  Therapy/Group: Individual Therapy  Markeesha Char 05/25/2012, 8:47 AM

## 2012-05-25 NOTE — Patient Care Conference (Signed)
Inpatient RehabilitationTeam Conference Note Date: 05/25/2012   Time: 11:20 AM    Patient Name: Lindsey [name]      Medical Record Number: [id number]  Date of Birth: [birth year] Sex: Female         Room/Bed: 4149/4149-01 Payor Info: Payor: MEDICARE  Plan: MEDICARE PART A AND B  Product Type: *No Product type*     Admitting Diagnosis: LT CVA  Admit Date/Time:  05/20/2012  4:32 PM Admission Comments: No comment available   Primary Diagnosis:  Stroke Principal Problem: Stroke  Patient Active Problem List   Diagnosis Date Noted  . Acute kidney injury 05/19/2012  . Stroke 05/19/2012  . Ear pain 03/30/2011  . Well woman exam 10/13/2010  . Neuropathy 09/22/2010  . BACK PAIN, LUMBAR, WITH RADICULOPATHY 07/23/2010  . DIARRHEA 06/05/2010  . SLEEP APNEA 05/06/2010  . OBESITY, MORBID 03/20/2010  . Other dyspnea and respiratory abnormality 03/20/2010  . HEADACHE 03/13/2010  . EMESIS 02/04/2010  . CHEST PAIN UNSPECIFIED 11/21/2009  . VERTIGO 11/05/2009  . HYPERLIPIDEMIA 10/18/2009  . Essential hypertension, benign 10/18/2009  . CVA 10/18/2009  . GASTROINTESTINAL DISORDER, FUNCTIONAL 10/18/2009  . ARNOLD-CHIARI MALFORMATION 10/18/2009  . CONTRAST DYE ALLERGY 10/18/2009    Expected Discharge Date: Expected Discharge Date: 05/31/12  Team Members Present: Physician: Dr. Claudette Laws Social Worker Present: Dossie Der, LCSW Nurse Present: Gregor Hams, RN PT Present: Edman Circle, Lillie Columbia, PT OT Present: Bretta Bang, Verlene Mayer, OT SLP Present: Fae Pippin, SLP Other (Discipline and Name): Langston Reusing Noel-PPS Coordinator & Bayard Hugger- RN Manager     Current Status/Progress Goal Weekly Team Focus  Medical   Pre renal azotemia improved on IVF  maintain hydration off IVF  monitor I and O, BMET   Bowel/Bladder   Continent of bowel and bladder.incontinent of bladder at times due to urinary  urgency. . LBM.05/24/2012. Uses pad  Remain continent of bowel  and  bladder.  Monitor.   Swallow/Nutrition/ Hydration             ADL's   Overall Min assist except Total for LB dressing, had declined shower since admission due to dizziness and/or nausea  LTGs revised from Mod I to Min assist for LB and shower transfers and Mod I for othe ADL tasks from a w/c level  Activity tolerance, gaze stabilization in attempt to decrease episodes of dizziness and nausea, dynamic balance, LUE forced use, strengthening and coordination   Mobility   Supervision bed mobility, Supervision-min A transfers and ambulation ~100' with RW with L hand orthosis, supervision-min A up and down 8 stairs with 1 handrail; acitivity tolerance limitied secondary to nausea/vertigo  Mod I household w/c mobility and transfers; supervision with community w/c mobility, ambulation 150' (75' household), negotiation of 3 stairs  Activity tolerance, trial transfers and ambulation with LBQC, standing dynamic balance, general strengthening   Communication             Safety/Cognition/ Behavioral Observations            Pain   n/a  n/a  n/a   Skin    Bilateral heels with thick dry cracking skin. Marland Kitchen Applied Eucerin cream .  No new skin breakdown or infection  Assess skin q shift.      *See Interdisciplinary Assessment and Plan and progress notes for long and short-term goals  Barriers to Discharge: will need physical assist post D/C    Possible Resolutions to Barriers:  identify caregiver    Discharge Planning/Teaching Needs:  Can go  to duaghter's or son's home, but would like to go to ehr home.  Either place she will be alone at times.      Team Discussion:  To go to daughter's home with sister staying during the day.  Some urgency with bladder-toliet every 3 hrs. Encourage to drink.  Making progress, to see neuro-psych for coping.  Multiple losses in a short period of time.  Revisions to Treatment Plan:  NOne   Continued Need for Acute Rehabilitation Level of Care: The patient requires  daily medical management by a physician with specialized training in physical medicine and rehabilitation for the following conditions: Daily direction of a multidisciplinary physical rehabilitation program to ensure safe treatment while eliciting the highest outcome that is of practical value to the patient.: Yes Daily medical management of patient stability for increased activity during participation in an intensive rehabilitation regime.: Yes Daily analysis of laboratory values and/or radiology reports with any subsequent need for medication adjustment of medical intervention for : Neurological problems  Braxen Dobek, Lemar Livings 05/25/2012, 3:02 PM

## 2012-05-25 NOTE — Progress Notes (Signed)
Social Work Patient ID: Lindsey [name], female   DOB: 1961/04/29, 51 y.o.   MRN: [id number] Met with pt and her sister who was here to inform of team conference goals-supervision level and discharge 11/26.  Plan now is for pt  To go to her daughter's with her sister staying with her during the day while daughter works.  Discussed OP recommended and sister reports she can  Transport her there.  Both agreeable to discharge and goals.  Pt receptive to seeing the Neuro-psych tomorrow for her coping issues.

## 2012-05-25 NOTE — Progress Notes (Signed)
Patient ID: Lindsey [name], female   DOB: [birth year], 51 y.o.   MRN: [id number] 51 y.o. 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 Patient was independent prior to admission without the use of assistive device. Admitted 05/18/2012 after a fall and increased left-sided weakness. MRI of the brain showed acute infarct right internal capsule right thalamus and small area of acute infarct in the left insula and left deep frontal white matter. Patient was on aspirin prior to admission and aggrenox was added  Subjective/Complaints: L hip pain since fall with CVA, not getting much better Knee xray 11/13 were normal Review of Systems  Constitutional: Negative for fever.  Neurological: Positive for sensory change and focal weakness. Negative for seizures.  Psychiatric/Behavioral: Negative for depression.  All other systems reviewed and are negative.    Objective: Vital Signs: Blood pressure 129/82, pulse 60, temperature 98 F (36.7 C), temperature source Oral, resp. rate 18, height 4\' 11"  (1.499 m), weight 82.3 kg (181 lb 7 oz), last menstrual period 05/11/2012, SpO2 96.00%. No results found. Results for orders placed during the hospital encounter of 05/20/12 (from the past 72 hour(s))  COMPREHENSIVE METABOLIC PANEL     Status: Abnormal   Collection Time   05/23/12  5:00 AM      Component Value Range Comment   Sodium 136  135 - 145 mEq/L    Potassium 4.3  3.5 - 5.1 mEq/L    Chloride 103  96 - 112 mEq/L    CO2 24  19 - 32 mEq/L    Glucose, Bld 80  70 - 99 mg/dL    BUN 19  6 - 23 mg/dL    Creatinine, Ser 8.41 (*) 0.50 - 1.10 mg/dL    Calcium 8.8  8.4 - 32.4 mg/dL    Total Protein 6.5  6.0 - 8.3 g/dL    Albumin 3.0 (*) 3.5 - 5.2 g/dL    AST 13  0 - 37 U/L    ALT 13  0 - 35 U/L    Alkaline Phosphatase 69  39 - 117 U/L    Total Bilirubin 0.3   0.3 - 1.2 mg/dL    GFR calc non Af Amer 48 (*) >90 mL/min    GFR calc Af Amer 55 (*) >90 mL/min   CBC WITH DIFFERENTIAL     Status: Abnormal   Collection Time   05/23/12  5:00 AM      Component Value Range Comment   WBC 8.2  4.0 - 10.5 K/uL    RBC 3.77 (*) 3.87 - 5.11 MIL/uL    Hemoglobin 10.9 (*) 12.0 - 15.0 g/dL    HCT 40.1 (*) 02.7 - 46.0 %    MCV 80.9  78.0 - 100.0 fL    MCH 28.9  26.0 - 34.0 pg    MCHC 35.7  30.0 - 36.0 g/dL    RDW 25.3 (*) 66.4 - 15.5 %    Platelets 282  150 - 400 K/uL    Neutrophils Relative 30 (*) 43 - 77 %    Neutro Abs 2.5  1.7 - 7.7 K/uL    Lymphocytes Relative 56 (*) 12 - 46 %    Lymphs Abs 4.6 (*) 0.7 - 4.0 K/uL    Monocytes Relative 6  3 - 12 %    Monocytes Absolute 0.5  0.1 - 1.0 K/uL  Eosinophils Relative 6 (*) 0 - 5 %    Eosinophils Absolute 0.5  0.0 - 0.7 K/uL    Basophils Relative 1  0 - 1 %    Basophils Absolute 0.1  0.0 - 0.1 K/uL      HEENT: normal Cardio: RRR Resp: CTA B/L GI: BS positive Extremity:  No Edema Skin:   Intact Neuro: Alert/Oriented, Cranial Nerve II-XII normal, Abnormal Motor 3-/5 L Delt, Bi, Tri, grip, HF, KE and Ankle DF and Abnormal FMC Ataxic/ dec FMC Musc/Skel:  Normal, mild tenderness over L greater troch of hip Gen:  NAD Mood affect flat  Assessment/Plan: 1. Functional deficits secondary to right internal capsule, thalamus and left insula and deep frontal white matter infarcts, likely thrombotic   which require 3+ hours per day of interdisciplinary therapy in a comprehensive inpatient rehab setting. Physiatrist is providing close team supervision and 24 hour management of active medical problems listed below. Physiatrist and rehab team continue to assess barriers to discharge/monitor patient progress toward functional and medical goals. FIM: FIM - Bathing Bathing Steps Patient Completed: Chest;Right Arm;Left Arm;Front perineal area;Abdomen;Right upper leg;Left upper leg;Buttocks Bathing: 4: Min-Patient  completes 8-9 69f 10 parts or 75+ percent  FIM - Upper Body Dressing/Undressing Upper body dressing/undressing steps patient completed: Thread/unthread right sleeve of pullover shirt/dresss;Put head through opening of pull over shirt/dress;Pull shirt over trunk;Thread/unthread left sleeve of pullover shirt/dress (declined to remove and replace sports bra today) Upper body dressing/undressing: 5: Supervision: Safety issues/verbal cues FIM - Lower Body Dressing/Undressing Lower body dressing/undressing steps patient completed: Pull pants up/down;Pull underwear up/down Lower body dressing/undressing: 1: Total-Patient completed less than 25% of tasks  FIM - Toileting Toileting steps completed by patient: Adjust clothing prior to toileting;Performs perineal hygiene;Adjust clothing after toileting Toileting Assistive Devices: Grab bar or rail for support Toileting: 5: Supervision: Safety issues/verbal cues  FIM - Diplomatic Services operational officer Devices: Grab bars;Walker Toilet Transfers: 5-To toilet/BSC: Supervision (verbal cues/safety issues);5-From toilet/BSC: Supervision (verbal cues/safety issues)  FIM - Banker Devices: Walker;Arm rests;Orthosis (L hand orthosis on RW) Bed/Chair Transfer: 4: Bed > Chair or W/C: Min A (steadying Pt. > 75%);4: Chair or W/C > Bed: Min A (steadying Pt. > 75%)  FIM - Locomotion: Wheelchair Distance: 150 Locomotion: Wheelchair: 1: Total Assistance/staff pushes wheelchair (Pt<25%) FIM - Locomotion: Ambulation Locomotion: Ambulation Assistive Devices: Walker - Rolling;Orthosis (L hand orthosis on RW) Ambulation/Gait Assistance: 4: Min assist Locomotion: Ambulation: 2: Travels 50 - 149 ft with minimal assistance (Pt.>75%)  Comprehension Comprehension Mode: Auditory Comprehension: 5-Understands basic 90% of the time/requires cueing < 10% of the time  Expression Expression Mode: Verbal Expression: 5-Expresses  basic needs/ideas: With extra time/assistive device  Social Interaction Social Interaction: 6-Interacts appropriately with others with medication or extra time (anti-anxiety, antidepressant).  Problem Solving Problem Solving: 5-Solves basic 90% of the time/requires cueing < 10% of the time  Memory Memory: 5-Recognizes or recalls 90% of the time/requires cueing < 10% of the time  Medical Problem List and Plan:  1. DVT Prophylaxis/Anticoagulation: Pharmaceutical: Lovenox  2. Pain Management: N/C  3. Mood: Continue Zoloft 50mg . Flat affect noted. Order neuropsych and have LCSW follow for formal evaluation.  4. Neuropsych: This patient is capable of making decisions on his/her own behalf.  5. HTN: monitor with bid checks. Continue Norvasc, HCTZ and metoprolol. Monitor hydration status on diuretics. Reduce HCTZ due to elevated BUN/Creat.  D/C IV with corrected E lytes and renal status 6. Migraine: continue tramadol for chronic headaches.  7: Hyperlipidemia: On lipitor daily.  8. CKD: at baseline 9. Constipation: . Marland Kitchen Start miralax, D/C reglan and monitor 10.  L hip pain after fall>1 wk ago check xray  LOS (Days) 5 A FACE TO FACE EVALUATION WAS PERFORMED  Korinne Greenstein E 05/25/2012, 10:10 AM

## 2012-05-25 NOTE — Plan of Care (Signed)
Problem: RH BOWEL ELIMINATION Goal: RH STG MANAGE BOWEL W/MEDICATION W/ASSISTANCE STG Manage Bowel with Medication with min assistance with use of scheduled and prn bowel medications Outcome: Progressing Declined miralax today

## 2012-05-25 NOTE — Progress Notes (Signed)
Recreational Therapy Session Note  Patient Details  Name: JACQUILYN SELDON MRN: [id number] Date of Birth: 1960/11/14 Today's Date: 05/25/2012  Pt participated in animal assisted activity/therapy seated w/c level.  Blanca Thornton 05/25/2012, 4:50 PM

## 2012-05-26 ENCOUNTER — Inpatient Hospital Stay (HOSPITAL_COMMUNITY): Payer: Medicare Other | Admitting: *Deleted

## 2012-05-26 ENCOUNTER — Encounter (HOSPITAL_COMMUNITY): Payer: Medicare Other

## 2012-05-26 ENCOUNTER — Encounter (HOSPITAL_COMMUNITY): Payer: Medicare Other | Admitting: *Deleted

## 2012-05-26 ENCOUNTER — Encounter (HOSPITAL_COMMUNITY): Payer: Medicare Other | Admitting: Occupational Therapy

## 2012-05-26 LAB — BASIC METABOLIC PANEL
Calcium: 8.9 mg/dL (ref 8.4–10.5)
Creatinine, Ser: 1.48 mg/dL — ABNORMAL HIGH (ref 0.50–1.10)
GFR calc non Af Amer: 40 mL/min — ABNORMAL LOW (ref >90)
Sodium: 137 mEq/L (ref 135–145)

## 2012-05-26 LAB — URINALYSIS, ROUTINE W REFLEX MICROSCOPIC
Hgb urine dipstick: NEGATIVE
Ketones, ur: NEGATIVE mg/dL
Leukocytes, UA: NEGATIVE
Protein, ur: NEGATIVE mg/dL
Urobilinogen, UA: 1 mg/dL (ref 0.0–1.0)

## 2012-05-26 NOTE — Progress Notes (Signed)
Physical Therapy Session Note  Patient Details  Name: Lindsey [name] MRN: [id number] Date of Birth: March 27, 1961  Today's Date: 05/26/2012 Time: 1315-1430 Time Calculation (min): 75 min  Short Term Goals: Week 1:  PT Short Term Goal 1 (Week 1): Pt will consistently perform transfers with S only and safe technique PT Short Term Goal 2 (Week 1): Pt will ambulate 93' with Min A and LRAD PT Short Term Goal 3 (Week 1): Pt will demonstrate dynamic standing balance with Min A PT Short Term Goal 4 (Week 1): Pt will perform bed mobility with Mod I   Skilled Therapeutic Interventions/Progress Updates:    Patient received sitting in wheelchair with Aunt present. Patient continues to report R foot numbness and feeling tired. Patient instructed in wheelchair mobility around unit, to elevators, off unit in lobby, and outside on various surfaces (concrete, brick, small door thresholds). Patient propels for 25-50 feet bouts at a time with min assist for negotiation of obstacles. Patient continues to require mod-max verbal and tactile cues for proper sequencing, technique, coordination, and attention to task. Noted improvements in patient's ability to propel with L hand as compared to AM session. Patient reports theraband on L wheel rim improves patient's ability to grip the rim. Patient demonstrates increased distractibility with less controlled environments, such as lobby and outside.  Patient instructed in gait training outside on various surfaces (concrete, brick) 200' x1 and 155' x1 with RW with L hand orthosis and supervision. Patient did not experience any LOB, but requires verbal cues to negotiate changes in surface, cracks, and small thresholds.  Patient instructed in community reintegration techniques/strategies. Discussion about community mobility, safety, potential hazards in the community, and energy conservation. Patient participated in discussion appropriately and verbalized  understanding.  Patient returned to room and assisted to bathroom, requiring supervision for toilet transfer with use of RW with L hand orthosis. Patient left in room with all needs within reach.  Therapy Documentation Precautions:  Precautions Precautions: Fall Precaution Comments: L hemiparesis, h/o "vertigo" per pt  Required Braces or Orthoses: Other Brace/Splint Other Brace/Splint: L hand orthosis on RW Restrictions Weight Bearing Restrictions: No  Pain: Pain Assessment Pain Assessment: 0-10 Pain Score:   2 Pain Type: Acute pain Pain Location: Foot Pain Orientation: Right Pain Descriptors: Numbness Pain Onset: Gradual Pain Intervention(s): Ambulation/increased activity;RN made aware;Repositioned;Therapeutic touch  See FIM for current functional status  Therapy/Group: Individual Therapy  Chipper Herb. Makaela Cando, PT, DPT  05/26/2012, 3:36 PM

## 2012-05-26 NOTE — Consult Note (Signed)
NEUROCOGNITIVE STATUS EXAMINATION - [facility]   Ms. Celicia Critser is a 51 year old, right-handed woman who was seen for a neurocognitive status examination to assess her emotional state and mental status post-stroke.  According to her medical record, Ms. Marsico has a history of hypertension, chronic kidney disease, and multiple recurrent ischemic and hemorrhagic strokes affecting her left side.  She was admitted on 05/18/2012 after a fall and increased left-sided weakness.  MRI of the brain revealed an acute infarct in the right internal capsule, right thalamus, and a small area of acute infarct in the left insula and left deep frontal white matter.    Emotional Functioning:  During the clinical interview, Ms. Nauert disclosed feelings of sadness and depressed mood, predominantly related to grief.  She reported that 6 individuals that are close to her have passed away within the last year, including her cousin, aunt, uncle, father, boyfriend, and brother.  Her brother's death was the most recent (1-2 weeks ago) and she had lived with him for the past several years.  She said that she missed her brother; mostly him telling her how much he loved her each day.  Ms. Livelsberger discussed the deaths of these individuals without demonstration of emotion and when questioned about that, she said that she does not want to feel it too much, because it would make her depressed.  She commented that she typically copes with her grief by sleeping, because it is a good way to escape the painful thoughts and emotions.  Regarding her recent stroke, Ms. Sprenkle seemed motivated to work hard in order to recover.  She repeatedly stated that she wanted to "get strong" so she could "go home."  While she did not seem particularly depressed secondary to her stroke, she repeatedly questioned why she was having so many strokes.  She said that her physicians have told her that they hope to  generate a hypothesis regarding the etiology of her multiple strokes and share that with her prior to her discharge, but that she has not been told yet why she may be having numerous strokes.    Her responses to a self-report measure used to assess for symptoms of depression were suggestive of a mild level of depression.  However, owing to the content of our discussion, it is likely that her current depressed mood is secondary to bereavement, rather than an underlying depressive disorder.    Mental Status:  Ms. Waldmann' total score on an overall measure of mental status was not suggestive of the presence of marked cognitive impairment (MMSE-2 brief = 12/16).  After immediately registering 3 words into memory, she was able to freely recall 2/3 words after a brief delay.  She lost additional points for misstating the season, month, and date.  Of importance, she reported being quite tired prior to administration of this task and was falling asleep at times during administration.  As such, her true cognitive potential is likely greater than this estimated score.    During this session, we discussed coping strategies, particularly to help with issues surrounding grief and loss.  She mentioned that she had participated in individual psychotherapy in the past, which was not helpful.  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  She commented that she has been able to implement some lessons that she learned from her volunteer work to help her with  coping so far.  In addition to participation in a group, we explored other outlets to express her emotions, including talking to her oldest sister and journaling.  She felt that both of those other options would be useful as well.  At the end of the session, Ms. Decker commented that talking with the neuropsychologist had been "a relief" and she plans to  continue getting her feelings out, rather than holding them in so that she can grieve in a healthy way that does not impact her physical recovery from stroke.     DIAGNOSES: Stroke syndrome Cognitive disorder, NOS Bereavement  RECOMMENDATIONS:  Recommendations for treatment team:     Ms. Riddle will likely benefit from participation in a bereavement support group.  Referral for this upon discharge should be a priority.  Additionally, if there is such a group that she can sit in on in the hospital during her inpatient stay, she will likely benefit significantly from that participation.     If possible to provide paper for journaling, this may serve to improve Ms. Haydon' mood, as it will allow her to privately release emotions.     Whenever it is possible to listen to Ms. Genter' concerns about her medical situation or recent losses, this will likely provide her with significant emotional relief and serve to keep major depression at bay.  Just listening empathically is sufficient.    Recommendations for discharge planning:     Engage in bereavement support group.    Use journaling to process emotions.     Maintain engagement in mentally, physically and cognitively stimulating activities.    Strive to maintain a healthy lifestyle (e.g., proper diet and exercise) in order to promote physical, cognitive and emotional health.   Leavy Cella, Psy.D.  Clinical Neuropsychologist

## 2012-05-26 NOTE — Progress Notes (Signed)
Occupational Therapy Session Note  Patient Details  Name: Lindsey [name] MRN: [id number] Date of Birth: 07-25-1960  Today's Date: 05/26/2012 Time: 5784-6962 Time Calculation (min): 6 min  Skilled Therapeutic Interventions/Progress Updates:   Canceled OT session: Attempted OT treatment session today for bathing and dressing @ shower level. Pt was difficult to arouse and then unable to stay awake in order to participate in OT treatment session despite multiple attempts, as well as verbal and tactile cues. Discussed with pt's RN whom stated that this may be medication related. Will re-attempt later if able.  Therapy Documentation Precautions:  Precautions Precautions: Fall Precaution Comments: L hemiparesis, h/o "vertigo" per pt Restrictions Weight Bearing Restrictions: No General: General Amount of Missed OT Time (min): 54 Minutes   Pain: Pain Assessment Pain Assessment: No/denies pain Pain Score: 0-No pain     See FIM for current functional status Therapy/Group: Individual Therapy  Alm Bustard 05/26/2012, 10:14 AM

## 2012-05-26 NOTE — Progress Notes (Signed)
Followed up on patient regarding today's events. Feels better but tired. Was able to tolerate therapies in afternoon. Sedation likely due to meclizine--was discontinued. Numbness/tingling right foot/ankle resolved now. Has peripheral neuropathy likely exacerbated increased dependency on right side.

## 2012-05-26 NOTE — Progress Notes (Signed)
Recreational Therapy Session Note  Patient Details  Name: [name] MRN: [id number] Date of Birth: Dec 21, 1960 Today's Date: 05/26/2012 Time: 1315-1500 Pain: no c/o Skilled Therapeutic Interventions/Progress Updates: Pt not feeling well today, drowsy & with intermittent c/o dizziness, RN aware.  Community reintegration/outing to [employer] cancelled, however pt participated in community reintegration activities outdoors on hospital grounds.  Therapy session focused on activity tolerance, UE strengthening, w/c mobility, ambulation with RW, discussion of potential barriers & ways to negotiate them & energy conservation techniques.  Pt performed w/c mobility and ambulated using RW with Min assist. Therapy/Group: Community Reintegration   Bita Cartwright 05/26/2012, 10:12 AM

## 2012-05-26 NOTE — Progress Notes (Signed)
Physical Therapy Session Note  Patient Details  Name: Lindsey [name] MRN: [id number] Date of Birth: [birth year]  Today's Date: 05/26/2012 Time: 0830-0930 Time Calculation (min): 60 min  Short Term Goals: Week 1:  PT Short Term Goal 1 (Week 1): Pt will consistently perform transfers with S only and safe technique PT Short Term Goal 2 (Week 1): Pt will ambulate 62' with Min A and LRAD PT Short Term Goal 3 (Week 1): Pt will demonstrate dynamic standing balance with Min A PT Short Term Goal 4 (Week 1): Pt will perform bed mobility with Mod I   Skilled Therapeutic Interventions/Progress Updates:    Patient received from RN after ambulation to the bathroom. Patient performed toilet transfer with rolling walker with L hand orthosis and supervision required for verbal cues for safety. Patient able to adjust clothing before and after toileting and perform hygiene with supervision. Patient reports numbness in her R foot, especially across the tops of her toes; RN notified. Patient instructed in wheelchair mobility x50' with mod assist. Patient requires max verbal cues for sequencing and technique and mod tactile cues for UE coordination.   Patient instructed in pre-gait activities: Patient performed anterior/posterior stepping with R LE to facilitate increased weight shift to the L, increased stance time on L LE, and increased weight bearing through L LE. Patient requires min assist for upright posture and to maintain balance. PT provides tactile cues for weight shift to the L during stepping and provides stabilization at L knee. Patient exhibits improved R foot step length and clearance as activity progresses. Patient with one LOB requiring mod assist to maintain balance. Patient performed two sets of this activity x approximately 3 minutes each. Towards end of task, patient has difficulty maintaining attention to task.  Patient instructed in gait training x150' with rolling walker with L hand orthosis  and min assist. Patient instructed to perform same weight shifts as during pre-gait activity. PT facilitates weight shifts bilaterally to improve step and stride lengths.  PT applies theraband to L wheelchair rim to improve L grip strength of wheel and facilitate improved wheelchair propulsion. Patient propels 59' with min assist requiring mod verbal cues for sequencing and technique. Patient exhibits improved ability to propel and improved ability to use L hand. Patient reports L rim is easier to grip with theraband. Patient returned to room and left with all needs within reach.  Therapy Documentation Precautions:  Precautions Precautions: Fall Precaution Comments: L hemiparesis, h/o "vertigo" per pt  Required Braces or Orthoses: Other Brace/Splint Other Brace/Splint: L hand orthosis on RW Restrictions Weight Bearing Restrictions: No  Pain: Pain Assessment Pain Assessment: 0-10 Pain Score:   3 Pain Type: Acute pain Pain Location: Foot Pain Orientation: Right Pain Descriptors: Numbness Pain Onset: Gradual Pain Intervention(s): RN made aware;Repositioned;Ambulation/increased activity;Therapeutic touch Multiple Pain Sites: No  Locomotion : Ambulation Ambulation/Gait Assistance: 4: Min assist Wheelchair Mobility Distance: 50   See FIM for current functional status  Therapy/Group: Individual Therapy Zella Richer. Jayleon Mcfarlane, PT, DPT   05/26/2012, 10:52 AM

## 2012-05-26 NOTE — Progress Notes (Signed)
Patient ID: Lindsey [name], female   DOB: Sep 06, 1960, 51 y.o.   MRN: [id number] 51 y.o. 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 Patient was independent prior to admission without the use of assistive device. Admitted 05/18/2012 after a fall and increased left-sided weakness. MRI of the brain showed acute infarct right internal capsule right thalamus and small area of acute infarct in the left insula and left deep frontal white matter. Patient was on aspirin prior to admission and aggrenox was added  Subjective/Complaints: L hip pain xray neg Knee xray 11/13 were normal Review of Systems  Constitutional: Negative for fever.  Neurological: Positive for sensory change and focal weakness. Negative for seizures.  Psychiatric/Behavioral: Negative for depression.  All other systems reviewed and are negative.    Objective: Vital Signs: Blood pressure 125/82, pulse 63, temperature 98.5 F (36.9 C), temperature source Oral, resp. rate 18, height 4\' 11"  (1.499 m), weight 82.3 kg (181 lb 7 oz), last menstrual period 05/11/2012, SpO2 96.00%. Dg Hip Complete Left  05/25/2012  *RADIOLOGY REPORT*  Clinical Data: Left hip pain after fall  LEFT HIP - COMPLETE 2+ VIEW  Comparison: None.  Findings: Frontal pelvis with AP and frog-leg lateral views of the left hip show no fracture.  Joint space in the hips is relatively well preserved and is symmetric.  There is no substantial degenerative change in either hip.  SI joints and symphysis pubis are unremarkable.  Arcuate lines of the sacrum are preserved.  IMPRESSION: No acute bony findings.  No evidence to explain the patient's history of pain.   Original Report Authenticated By: Kennith Center, M.D.    No results found for this or any previous visit (from the past 72 hour(s)).   HEENT: normal Cardio: RRR Resp:  CTA B/L GI: BS positive Extremity:  No Edema Skin:   Intact Neuro: Alert/Oriented, Cranial Nerve II-XII normal, Abnormal Motor 3-/5 L Delt, Bi, Tri, grip, HF, KE and Ankle DF and Abnormal FMC Ataxic/ dec FMC Musc/Skel:  Normal, mild tenderness over L greater troch of hip Gen:  NAD Mood affect flat  Assessment/Plan: 1. Functional deficits secondary to right internal capsule, thalamus and left insula and deep frontal white matter infarcts, likely thrombotic   which require 3+ hours per day of interdisciplinary therapy in a comprehensive inpatient rehab setting. Physiatrist is providing close team supervision and 24 hour management of active medical problems listed below. Physiatrist and rehab team continue to assess barriers to discharge/monitor patient progress toward functional and medical goals. FIM: FIM - Bathing Bathing Steps Patient Completed: Chest;Right Arm;Left Arm;Abdomen (Declined to perform with OT, Completed by RN) Bathing: 3: Mod-Patient completes 5-7 37f 10 parts or 50-74%  FIM - Upper Body Dressing/Undressing Upper body dressing/undressing steps patient completed: Thread/unthread right sleeve of pullover shirt/dresss;Put head through opening of pull over shirt/dress;Pull shirt over trunk;Thread/unthread left sleeve of pullover shirt/dress (declined to remove and replace sports bra today) Upper body dressing/undressing: 5: Supervision: Safety issues/verbal cues FIM - Lower Body Dressing/Undressing Lower body dressing/undressing steps patient completed: Pull pants up/down;Thread/unthread right pants leg;Thread/unthread left pants leg;Don/Doff right sock Lower body dressing/undressing: 2: Max-Patient completed 25-49% of tasks  FIM - Toileting Toileting steps completed by patient: Adjust clothing prior to toileting;Performs perineal hygiene;Adjust clothing after toileting Toileting Assistive Devices: Grab bar or rail for support Toileting: 5: Supervision: Safety issues/verbal  cues  FIM - Toilet Transfers  Museum/gallery curator Devices: Grab bars;Walker Toilet Transfers: 5-To toilet/BSC: Supervision (verbal cues/safety issues);5-From toilet/BSC: Supervision (verbal cues/safety issues)  FIM - Press photographer Assistive Devices: Arm rests;Walker;Cane;Orthosis (Trial with [facility]; L hand orthosis on RW) Bed/Chair Transfer: 4: Bed > Chair or W/C: Min A (steadying Pt. > 75%);4: Chair or W/C > Bed: Min A (steadying Pt. > 75%)  FIM - Locomotion: Wheelchair Distance: 25 Locomotion: Wheelchair: 1: Travels less than 50 ft with moderate assistance (Pt: 50 - 74%) FIM - Locomotion: Ambulation Locomotion: Ambulation Assistive Devices: Walker - Rolling;Orthosis;Cane - Quad (Trial with [employer]; L hand orthosis on RW) Ambulation/Gait Assistance: 4: Min assist Locomotion: Ambulation: 2: Travels 50 - 149 ft with minimal assistance (Pt.>75%)  Comprehension Comprehension Mode: Auditory Comprehension: 5-Understands basic 90% of the time/requires cueing < 10% of the time  Expression Expression Mode: Verbal Expression: 5-Expresses basic needs/ideas: With extra time/assistive device  Social Interaction Social Interaction: 6-Interacts appropriately with others with medication or extra time (anti-anxiety, antidepressant).  Problem Solving Problem Solving: 5-Solves basic 90% of the time/requires cueing < 10% of the time  Memory Memory: 5-Recognizes or recalls 90% of the time/requires cueing < 10% of the time  Medical Problem List and Plan:  1. DVT Prophylaxis/Anticoagulation: Pharmaceutical: Lovenox  2. Pain Management: N/C  3. Mood: Continue Zoloft 50mg . Flat affect noted. Order neuropsych and have LCSW follow for formal evaluation.  4. Neuropsych: This patient is capable of making decisions on his/her own behalf.  5. HTN: monitor with bid checks. Continue Norvasc, HCTZ and metoprolol. Monitor hydration status on diuretics. Reduce HCTZ due to elevated  BUN/Creat.  D/C IV with corrected E lytes and renal status 6. Migraine: continue tramadol for chronic headaches.  7: Hyperlipidemia: On lipitor daily.  8. CKD: at baseline 9. Constipation: . Marland Kitchen Start miralax, D/C reglan and monitor 10.  L hip pain after fall contusion start heat cont PT  LOS (Days) 6 A FACE TO FACE EVALUATION WAS PERFORMED  KIRSTEINS,ANDREW E 05/26/2012, 8:24 AM

## 2012-05-27 ENCOUNTER — Inpatient Hospital Stay (HOSPITAL_COMMUNITY): Payer: Medicare Other | Admitting: *Deleted

## 2012-05-27 LAB — URINE CULTURE: Colony Count: 9000

## 2012-05-27 LAB — CREATININE, SERUM
Creatinine, Ser: 1.28 mg/dL — ABNORMAL HIGH (ref 0.50–1.10)
GFR calc Af Amer: 56 mL/min — ABNORMAL LOW (ref >90)
GFR calc non Af Amer: 48 mL/min — ABNORMAL LOW (ref >90)

## 2012-05-27 NOTE — Progress Notes (Signed)
Physical Therapy Session Note  Patient Details  Name: Lindsey [name] MRN: [id number] Date of Birth: [birth year]  Today's Date: 05/27/2012 Time: 0830-0930 Time Calculation (min): 60 min  Short Term Goals: Week 1:  PT Short Term Goal 1 (Week 1): Pt will consistently perform transfers with S only and safe technique PT Short Term Goal 2 (Week 1): Pt will ambulate 62' with Min A and LRAD PT Short Term Goal 3 (Week 1): Pt will demonstrate dynamic standing balance with Min A PT Short Term Goal 4 (Week 1): Pt will perform bed mobility with Mod I   Skilled Therapeutic Interventions/Progress Updates:    Patient received sitting edge of bed with eyes closed. Patient reports she needs to use the restroom. Patient reports she no longer has numbness in R foot. Patient sit to stand with RW with L hand orthosis with supervision, but requires multiple attempts to stand. Patient ambulated to bathroom with RW with L hand orthosis and supervision. Patient able to adjust cltohing before and after toileting and perform hygiene with independently with supervision for safety cues. Patient transferred to wheelchair with RW with left hand orthosis and supervision.   Patient instructed in wheelchair mobility x55' x1, 50' x1, and 105' x1 with supervision and theraband on L wheel rim. Patient requires extra time to complete and verbal cues for proper technique and to maintain straight path. Patient demonstrates improved ability to propel, improved sequencing, and improved understanding of obstacle negotiation and steering as compared to yesterday.   Patient instructed in gait training x155' with RW with L hand orthosis and supervision. Patient continues to require verbal cues for safety secondary to taking R hand off of walker to scratch her head and continuing to ambulate. Additionally, patient is highly externally distractible during gait training. Patient educated that if she needs to take her hand off the walker or if  she wants to look at something, it is safer to stop ambulating to do so. Patient verbalized understanding and demonstrated understanding later in ambulation when she stopped to take her R hand off the walker to adjust her clothing.  Patient performed car transfer with RW with L hand orthosis and supervision. Patient required two attempt to stand from low car surface, but was able to do so and maintain a supervision level of assist. Patient negotiated 12 steps with bilateral handrails and supervision. Patient did not require verbal cues for proper sequencing and technique initially, but did so later when attempting to ascend step with L LE instead of R LE. Patient educated on proper technique and patient verbalized understanding and was able to properly ascend/descend remainder of steps.  Overall, patient is much more alert as compared to yesterday as she reports that her medications were changed and she doesn't feel as sleepy. Patient returned to room and left with all needs within reach.  Therapy Documentation Precautions:  Precautions Precautions: Fall Precaution Comments: L hemiparesis, h/o "vertigo" per pt  Required Braces or Orthoses: Other Brace/Splint Other Brace/Splint: L hand orthosis on RW  Restrictions Weight Bearing Restrictions: No  Pain: Pain Assessment Pain Assessment: 0-10 Pain Score:   2 Pain Type: Acute pain Pain Location: Toe (Comment which one) (L pinky toe) Pain Orientation: Left Pain Descriptors: Sore Pain Onset: On-going Locomotion : Ambulation Ambulation/Gait Assistance: 5: Supervision Wheelchair Mobility Distance: 55   See FIM for current functional status  Therapy/Group: Individual Therapy  Chipper Herb. Leighana Neyman, PT, DPT 05/27/2012, 10:24 AM

## 2012-05-27 NOTE — Progress Notes (Signed)
Occupational Therapy Note  Patient Details  Name: Lindsey [name] MRN: [id number] Date of Birth: [birth year] Today's Date: 05/27/2012  Engaged in wc mobility, functional ambulation using RW with LUE positioner.  Addressed functional use of LUE to hold and place objects in simple sandwich prep.  Educated pt on walker placement, LUE use, carrying food items in safe and effective manner.  Pt needed moderate verbal instruction for carry over and needs reinforcement for using LUE, and passing items.  Humberto Seals 05/27/2012, 4:35 PM

## 2012-05-27 NOTE — Progress Notes (Signed)
Patient ID: SHELA ESSES, female   DOB: [birth year], 51 y.o.   MRN: [id number] 51 y.o. 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 Patient was independent prior to admission without the use of assistive device. Admitted 05/18/2012 after a fall and increased left-sided weakness. MRI of the brain showed acute infarct right internal capsule right thalamus and small area of acute infarct in the left insula and left deep frontal white matter. Patient was on aspirin prior to admission and aggrenox was added  Subjective/Complaints: Lethargic yesterday after trial of Antivert Increased nausea when Reglan was discontinued Review of Systems  Constitutional: Negative for fever.  Neurological: Positive for sensory change and focal weakness. Negative for seizures.  Psychiatric/Behavioral: Negative for depression.  All other systems reviewed and are negative.    Objective: Vital Signs: Blood pressure 139/85, pulse 64, temperature 98.2 F (36.8 C), temperature source Oral, resp. rate 17, height 4\' 11"  (1.499 m), weight 82.3 kg (181 lb 7 oz), last menstrual period 05/11/2012, SpO2 96.00%. No results found. Results for orders placed during the hospital encounter of 05/20/12 (from the past 72 hour(s))  BASIC METABOLIC PANEL     Status: Abnormal   Collection Time   05/26/12 10:39 AM      Component Value Range Comment   Sodium 137  135 - 145 mEq/L    Potassium 3.9  3.5 - 5.1 mEq/L    Chloride 101  96 - 112 mEq/L    CO2 27  19 - 32 mEq/L    Glucose, Bld 82  70 - 99 mg/dL    BUN 23  6 - 23 mg/dL    Creatinine, Ser 8.65 (*) 0.50 - 1.10 mg/dL    Calcium 8.9  8.4 - 78.4 mg/dL    GFR calc non Af Amer 40 (*) >90 mL/min    GFR calc Af Amer 47 (*) >90 mL/min   URINALYSIS, ROUTINE W REFLEX MICROSCOPIC     Status: Normal   Collection Time   05/26/12 11:42 AM        Component Value Range Comment   Color, Urine YELLOW  YELLOW    APPearance CLEAR  CLEAR    Specific Gravity, Urine 1.020  1.005 - 1.030    pH 5.5  5.0 - 8.0    Glucose, UA NEGATIVE  NEGATIVE mg/dL    Hgb urine dipstick NEGATIVE  NEGATIVE    Bilirubin Urine NEGATIVE  NEGATIVE    Ketones, ur NEGATIVE  NEGATIVE mg/dL    Protein, ur NEGATIVE  NEGATIVE mg/dL    Urobilinogen, UA 1.0  0.0 - 1.0 mg/dL    Nitrite NEGATIVE  NEGATIVE    Leukocytes, UA NEGATIVE  NEGATIVE MICROSCOPIC NOT DONE ON URINES WITH NEGATIVE PROTEIN, BLOOD, LEUKOCYTES, NITRITE, OR GLUCOSE <1000 mg/dL.  URINE CULTURE     Status: Normal   Collection Time   05/26/12 11:42 AM      Component Value Range Comment   Specimen Description URINE, CLEAN CATCH      Special Requests NONE      Culture  Setup Time 05/26/2012 12:22      Colony Count 9,000 COLONIES/ML      Culture INSIGNIFICANT GROWTH      Report Status 05/27/2012 FINAL     CREATININE, SERUM     Status: Abnormal   Collection Time   05/27/12  5:07 AM  Component Value Range Comment   Creatinine, Ser 1.28 (*) 0.50 - 1.10 mg/dL    GFR calc non Af Amer 48 (*) >90 mL/min    GFR calc Af Amer 56 (*) >90 mL/min      HEENT: normal Cardio: RRR Resp: CTA B/L GI: BS positive Extremity:  No Edema Skin:   Intact Neuro: Alert/Oriented, Cranial Nerve II-XII normal, Abnormal Motor 3-/5 L Delt, Bi, Tri, grip, HF, KE and Ankle DF and Abnormal FMC Ataxic/ dec FMC Musc/Skel:  Normal, mild tenderness over L greater troch of hip Gen:  NAD Mood affect flat  Assessment/Plan: 1. Functional deficits secondary to right internal capsule, thalamus and left insula and deep frontal white matter infarcts, likely thrombotic   which require 3+ hours per day of interdisciplinary therapy in a comprehensive inpatient rehab setting. Physiatrist is providing close team supervision and 24 hour management of active medical problems listed below. Physiatrist and rehab team continue to assess  barriers to discharge/monitor patient progress toward functional and medical goals. FIM: FIM - Bathing Bathing Steps Patient Completed: Chest;Right Arm;Left Arm;Abdomen (Declined to perform with OT, Completed by RN) Bathing: 3: Mod-Patient completes 5-7 36f 10 parts or 50-74%  FIM - Upper Body Dressing/Undressing Upper body dressing/undressing steps patient completed: Thread/unthread right sleeve of pullover shirt/dresss;Put head through opening of pull over shirt/dress;Pull shirt over trunk;Thread/unthread left sleeve of pullover shirt/dress (declined to remove and replace sports bra today) Upper body dressing/undressing: 5: Supervision: Safety issues/verbal cues FIM - Lower Body Dressing/Undressing Lower body dressing/undressing steps patient completed: Pull pants up/down;Thread/unthread right pants leg;Thread/unthread left pants leg;Don/Doff right sock Lower body dressing/undressing: 2: Max-Patient completed 25-49% of tasks  FIM - Toileting Toileting steps completed by patient: Adjust clothing prior to toileting;Performs perineal hygiene;Adjust clothing after toileting Toileting Assistive Devices: Grab bar or rail for support Toileting: 5: Supervision: Safety issues/verbal cues  FIM - Diplomatic Services operational officer Devices: Best boy Transfers: 5-To toilet/BSC: Supervision (verbal cues/safety issues);5-From toilet/BSC: Supervision (verbal cues/safety issues)  FIM - Banker Devices: Walker;Orthosis;Arm rests (L hand orthosis on RW) Bed/Chair Transfer: 5: Bed > Chair or W/C: Supervision (verbal cues/safety issues);5: Chair or W/C > Bed: Supervision (verbal cues/safety issues)  FIM - Locomotion: Wheelchair Distance: 55 Locomotion: Wheelchair: 2: Travels 50 - 149 ft with supervision, cueing or coaxing FIM - Locomotion: Ambulation Locomotion: Ambulation Assistive Devices: Designer, industrial/product Ambulation/Gait Assistance: 4: Min  guard Locomotion: Ambulation: 2: Travels 50 - 149 ft with minimal assistance (Pt.>75%)  Comprehension Comprehension Mode: Auditory Comprehension: 5-Follows basic conversation/direction: With extra time/assistive device  Expression Expression Mode: Verbal Expression: 5-Expresses basic needs/ideas: With extra time/assistive device  Social Interaction Social Interaction: 6-Interacts appropriately with others with medication or extra time (anti-anxiety, antidepressant).  Problem Solving Problem Solving: 5-Solves complex 90% of the time/cues < 10% of the time  Memory Memory: 5-Recognizes or recalls 90% of the time/requires cueing < 10% of the time  Medical Problem List and Plan:  1. DVT Prophylaxis/Anticoagulation: Pharmaceutical: Lovenox  2. Pain Management: N/C  3. Mood: Continue Zoloft 50mg . Flat affect noted. Order neuropsych and have LCSW follow for formal evaluation.  4. Neuropsych: This patient is capable of making decisions on his/her own behalf.  5. HTN: monitor with bid checks. Continue Norvasc, HCTZ and metoprolol. Monitor hydration status on diuretics. Reduce HCTZ due to elevated BUN/Creat.  D/C IV with corrected E lytes and renal status 6. Migraine: continue tramadol for chronic headaches.  7: Hyperlipidemia: On lipitor daily.  8. CKD:  at baseline 9. Constipation: . Marland Kitchen Start miralax, D/C reglan and monitor 10.  L hip pain after fall contusion start heat cont PT 11.Delayed gastric emptying continue Reglan no signs of extrapyramidal symptoms LOS (Days) 7 A FACE TO FACE EVALUATION WAS PERFORMED  KIRSTEINS,ANDREW E 05/27/2012, 1:56 PM

## 2012-05-27 NOTE — Progress Notes (Signed)
Occupational Therapy Note  Patient Details  Name: Lindsey [name] MRN: [id number] Date of Birth: Dec 12, 1960 Today's Date: 05/27/2012 Date:  1000-1100  (60 min)  Individual therapy Pain:  7/10 left arm with or without movement in elbow region  (see MAR)  Performed functional mobility with RW 60 feet with min assist.  Addressed Left UE AROM, PROM, reaching.  Pt has decreased finger movements on 2nd, 3rd, 4th digits  Educated pt on doing finger flexion, extension and doing both active and passive movement when she is in her room.    Also addressed shoulder external rotation, elbow extension with reaching and ball activities.  Pt. Tolerated session well and did not want any pain meds.     Humberto Seals 05/27/2012, 11:01 AM

## 2012-05-27 NOTE — Progress Notes (Signed)
Physical Therapy Session Note  Patient Details  Name: Lindsey [name] MRN: [id number] Date of Birth: 03/20/1961  Today's Date: 05/27/2012 Time: 1400-1430 Time Calculation (min): 30 min  Short Term Goals: Week 1:  PT Short Term Goal 1 (Week 1): Pt will consistently perform transfers with S only and safe technique PT Short Term Goal 2 (Week 1): Pt will ambulate 1' with Min A and LRAD PT Short Term Goal 3 (Week 1): Pt will demonstrate dynamic standing balance with Min A PT Short Term Goal 4 (Week 1): Pt will perform bed mobility with Mod I   Skilled Therapeutic Interventions/Progress Updates:    Patient received sitting in wheelchair in room. Patient instructed in wheelchair mobility x75' with supervision; theraband remains on L wheel rim. Patient required less verbal cues for maintaining straight path and obstacle negotiation. Patient continues to demonstrate improved ability to propel and improved ability to use L hand. Patient requires extra time to complete this task.  Patient instructed in pre-gait activities: Patient performed anterior/posterior and lateral stepping with R LE to facilitate increased weight shift to the L, increased stance time on L LE, and increased weight bearing through L LE. Patient requires min assist for upright posture and to maintain balance. PT provides tactile cues for weight shift to the L during stepping and provides stabilization at L knee. Patient exhibits improved R foot step length and clearance as activity progresses. Patient performed two sets of activity x approximately 3 minutes each.   Patient instructed in sit to stand activity from wheelchair with min assist. Patient's feet positioned in modified tandem with R foot ahead of L foot prior to standing in order to promote increased weight bearing and strengthening through L LE during sit to stand activity. Patient performed 3 repetitions, required a rest break and then performed 2 more  repetitions.  Patient instructed in gait training x150' with rolling walker with L hand orthosis and supervision. Patient instructed to perform same weight shifts as during pre-gait activity. PT facilitates weight shifts bilaterally to improve step and stride lengths. Patient returned to room and left with all needs within reach.  Therapy Documentation Precautions:  Precautions Precautions: Fall Precaution Comments: L hemiparesis, h/o "vertigo" per pt  Required Braces or Orthoses: Other Brace/Splint Other Brace/Splint: L hand orthosis on RW  Restrictions Weight Bearing Restrictions: No  Pain: Pain Assessment Pain Assessment: No/denies pain  See FIM for current functional status  Therapy/Group: Individual Therapy  Chipper Herb. Quin Mcpherson, PT, DPT  05/27/2012, 4:28 PM

## 2012-05-28 ENCOUNTER — Inpatient Hospital Stay (HOSPITAL_COMMUNITY): Payer: Medicare Other | Admitting: Physical Therapy

## 2012-05-28 DIAGNOSIS — G8110 Spastic hemiplegia affecting unspecified side: Secondary | ICD-10-CM

## 2012-05-28 DIAGNOSIS — I6330 Cerebral infarction due to thrombosis of unspecified cerebral artery: Secondary | ICD-10-CM

## 2012-05-28 NOTE — Progress Notes (Signed)
Physical Therapy Note  Patient Details  Name: Lindsey [name] MRN: [id number] Date of Birth: 15-May-1961 Today's Date: 05/28/2012  Time: 1300-1400 60 minutes  No c/o pain.  Pt c/o dizziness with head movements, eases somewhat with rest.  Gait training with RW with supervision, no LOB with obstacles or controlled environment gait.  Gait without AD with min steadying assist, somewhat limited by episode of vertigo.  Standing balance and wt shifts on ground with L UE reaching tasks L UE control and coordination.  Standing balance and step ups forward and laterally on to foam with min-mod A for balance.  Pt overall with good participation despite dizziness/vertigo, eager to go home.  Individual therapy   Burgandy Hackworth 05/28/2012, 4:20 PM

## 2012-05-28 NOTE — Progress Notes (Signed)
Nutrition Brief Note  Patient identified on the Malnutrition Screening Tool (MST) Report  Pt denies any recent weight changes, reports good appetite.   Body mass index is 36.65 kg/(m^2). Pt meets criteria for Obesity Class II based on current BMI.   Current diet order is Heart Healthy, patient is consuming approximately 100% of meals at this time. Labs and medications reviewed.   No nutrition interventions warranted at this time. If nutrition issues arise, please consult RD.   Kendell Bane RD, LDN, CNSC [phone] Weekend/After Hours Pager

## 2012-05-28 NOTE — Progress Notes (Signed)
Patient ID: Lindsey [name], female   DOB: 05/03/1961, 51 y.o.   MRN: [id number] 51 y.o. 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 Patient was independent prior to admission without the use of assistive device. Admitted 05/18/2012 after a fall and increased left-sided weakness. MRI of the brain showed acute infarct right internal capsule right thalamus and small area of acute infarct in the left insula and left deep frontal white matter. Patient was on aspirin prior to admission and aggrenox was added  Subjective/Complaints: Less lethargy, does report some dizziness and nausea Review of Systems  Constitutional: Negative for fever.  Neurological: Positive for sensory change and focal weakness. Negative for seizures.  Psychiatric/Behavioral: Negative for depression.  All other systems reviewed and are negative.    Objective: Vital Signs: Blood pressure 136/85, pulse 63, temperature 98.4 F (36.9 C), temperature source Oral, resp. rate 20, height 4\' 11"  (1.499 m), weight 82.3 kg (181 lb 7 oz), last menstrual period 05/11/2012, SpO2 95.00%. No results found. Results for orders placed during the hospital encounter of 05/20/12 (from the past 72 hour(s))  BASIC METABOLIC PANEL     Status: Abnormal   Collection Time   05/26/12 10:39 AM      Component Value Range Comment   Sodium 137  135 - 145 mEq/L    Potassium 3.9  3.5 - 5.1 mEq/L    Chloride 101  96 - 112 mEq/L    CO2 27  19 - 32 mEq/L    Glucose, Bld 82  70 - 99 mg/dL    BUN 23  6 - 23 mg/dL    Creatinine, Ser 4.09 (*) 0.50 - 1.10 mg/dL    Calcium 8.9  8.4 - 81.1 mg/dL    GFR calc non Af Amer 40 (*) >90 mL/min    GFR calc Af Amer 47 (*) >90 mL/min   URINALYSIS, ROUTINE W REFLEX MICROSCOPIC     Status: Normal   Collection Time   05/26/12 11:42 AM      Component Value Range Comment   Color, Urine YELLOW  YELLOW    APPearance CLEAR  CLEAR    Specific Gravity, Urine 1.020  1.005 - 1.030    pH 5.5  5.0 - 8.0    Glucose, UA NEGATIVE  NEGATIVE mg/dL    Hgb urine dipstick NEGATIVE  NEGATIVE    Bilirubin Urine NEGATIVE  NEGATIVE    Ketones, ur NEGATIVE  NEGATIVE mg/dL    Protein, ur NEGATIVE  NEGATIVE mg/dL    Urobilinogen, UA 1.0  0.0 - 1.0 mg/dL    Nitrite NEGATIVE  NEGATIVE    Leukocytes, UA NEGATIVE  NEGATIVE MICROSCOPIC NOT DONE ON URINES WITH NEGATIVE PROTEIN, BLOOD, LEUKOCYTES, NITRITE, OR GLUCOSE <1000 mg/dL.  URINE CULTURE     Status: Normal   Collection Time   05/26/12 11:42 AM      Component Value Range Comment   Specimen Description URINE, CLEAN CATCH      Special Requests NONE      Culture  Setup Time 05/26/2012 12:22      Colony Count 9,000 COLONIES/ML      Culture INSIGNIFICANT GROWTH      Report Status 05/27/2012 FINAL     CREATININE, SERUM     Status: Abnormal   Collection Time   05/27/12  5:07 AM      Component Value Range Comment  Creatinine, Ser 1.28 (*) 0.50 - 1.10 mg/dL    GFR calc non Af Amer 48 (*) >90 mL/min    GFR calc Af Amer 56 (*) >90 mL/min      HEENT: normal Cardio: RRR Resp: CTA B/L GI: BS positive Extremity:  No Edema Skin:   Intact Neuro: Alert/Oriented, Cranial Nerve II-XII normal, Abnormal Motor 3-/5 L Delt, Bi, Tri, grip, HF, KE and Ankle DF and Abnormal FMC Ataxic/ dec FMC Musc/Skel:  Normal, mild tenderness over L greater troch of hip Gen:  NAD Mood affect flat  Assessment/Plan: 1. Functional deficits secondary to right internal capsule, thalamus and left insula and deep frontal white matter infarcts, likely thrombotic   which require 3+ hours per day of interdisciplinary therapy in a comprehensive inpatient rehab setting. Physiatrist is providing close team supervision and 24 hour management of active medical problems listed below. Physiatrist and rehab team continue to assess barriers to discharge/monitor patient  progress toward functional and medical goals. FIM: FIM - Bathing Bathing Steps Patient Completed: Chest;Right Arm;Left Arm;Abdomen (Declined to perform with OT, Completed by RN) Bathing: 3: Mod-Patient completes 5-7 24f 10 parts or 50-74%  FIM - Upper Body Dressing/Undressing Upper body dressing/undressing steps patient completed: Thread/unthread right sleeve of pullover shirt/dresss;Put head through opening of pull over shirt/dress;Pull shirt over trunk;Thread/unthread left sleeve of pullover shirt/dress (declined to remove and replace sports bra today) Upper body dressing/undressing: 5: Supervision: Safety issues/verbal cues FIM - Lower Body Dressing/Undressing Lower body dressing/undressing steps patient completed: Pull pants up/down;Thread/unthread right pants leg;Thread/unthread left pants leg;Don/Doff right sock Lower body dressing/undressing: 2: Max-Patient completed 25-49% of tasks  FIM - Toileting Toileting steps completed by patient: Adjust clothing prior to toileting;Performs perineal hygiene;Adjust clothing after toileting Toileting Assistive Devices: Grab bar or rail for support Toileting: 5: Supervision: Safety issues/verbal cues  FIM - Diplomatic Services operational officer Devices: Grab bars;Walker Toilet Transfers: 5-To toilet/BSC: Supervision (verbal cues/safety issues)  FIM - Banker Devices: Walker;Orthosis;Arm rests Bed/Chair Transfer: 5: Bed > Chair or W/C: Supervision (verbal cues/safety issues)  FIM - Locomotion: Wheelchair Distance: 55 Locomotion: Wheelchair: 2: Travels 50 - 149 ft with supervision, cueing or coaxing FIM - Locomotion: Ambulation Locomotion: Ambulation Assistive Devices: Designer, industrial/product Ambulation/Gait Assistance: 4: Min guard Locomotion: Ambulation: 2: Travels 50 - 149 ft with minimal assistance (Pt.>75%)  Comprehension Comprehension Mode: Asleep Comprehension: 5-Follows basic  conversation/direction: With extra time/assistive device  Expression Expression Mode: Asleep Expression: 5-Expresses basic needs/ideas: With extra time/assistive device  Social Interaction Social Interaction Mode: Asleep Social Interaction: 6-Interacts appropriately with others with medication or extra time (anti-anxiety, antidepressant).  Problem Solving Problem Solving: 5-Solves complex 90% of the time/cues < 10% of the time  Memory Memory Mode: Asleep Memory: 5-Recognizes or recalls 90% of the time/requires cueing < 10% of the time  Medical Problem List and Plan:  1. DVT Prophylaxis/Anticoagulation: Pharmaceutical: Lovenox  2. Pain Management: N/C  3. Mood: Continue Zoloft 50mg . Flat affect noted. Order neuropsych and have LCSW follow for formal evaluation.  4. Neuropsych: This patient is capable of making decisions on his/her own behalf.  5. HTN: monitor with bid checks. Continue Norvasc, HCTZ and metoprolol. Monitor hydration status on diuretics. Reduce HCTZ due to elevated BUN/Creat.  D/C IV with corrected E lytes and renal status 6. Migraine: continue tramadol for chronic headaches.  7: Hyperlipidemia: On lipitor daily.  8. CKD: at baseline 9. Constipation: . Marland Kitchen Start miralax, D/C'ed reglan  10.  L hip pain after fall  contusion start heat cont PT 11.Delayed gastric emptying continue Reglan no signs of extrapyramidal symptoms LOS (Days) 8 A FACE TO FACE EVALUATION WAS PERFORMED  Kiren Mcisaac T 05/28/2012, 8:35 AM

## 2012-05-29 ENCOUNTER — Inpatient Hospital Stay (HOSPITAL_COMMUNITY): Payer: Medicare Other | Admitting: Physical Therapy

## 2012-05-29 NOTE — Progress Notes (Addendum)
Physical Therapy Note  Patient Details  Name: LEVONA KURDZIEL MRN: [id number] Date of Birth: 11-Jun-1961 Today's Date: 05/29/2012  1000-1025 (40 minutes) double Pain: no complaint of pain Focus of treatment: gait training Treatment: Gait- 160 feet RW + hand splint left min assist X 2; up/down 6 inch step LT Le only for quad/glut strengthening/control X 15.    Tristy Udovich,JIM 05/29/2012, 7:29 AM

## 2012-05-29 NOTE — Progress Notes (Signed)
Continent of urine with timed toileting. Wears peri pads to manage stress incontinence.Lindsey [name] A

## 2012-05-29 NOTE — Progress Notes (Signed)
Patient ID: Lindsey [name], female   DOB: December 09, 1960, 51 y.o.   MRN: [id number] 51 y.o. 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 Patient was independent prior to admission without the use of assistive device. Admitted 05/18/2012 after a fall and increased left-sided weakness. MRI of the brain showed acute infarct right internal capsule right thalamus and small area of acute infarct in the left insula and left deep frontal white matter. Patient was on aspirin prior to admission and aggrenox was added  Subjective/Complaints: No new complaints today. Review of Systems  Constitutional: Negative for fever.  Neurological: Positive for sensory change and focal weakness. Negative for seizures.  Psychiatric/Behavioral: Negative for depression.  All other systems reviewed and are negative.    Objective: Vital Signs: Blood pressure 126/85, pulse 61, temperature 98.3 F (36.8 C), temperature source Oral, resp. rate 18, height 4\' 11"  (1.499 m), weight 82.3 kg (181 lb 7 oz), last menstrual period 05/11/2012, SpO2 95.00%. No results found. Results for orders placed during the hospital encounter of 05/20/12 (from the past 72 hour(s))  BASIC METABOLIC PANEL     Status: Abnormal   Collection Time   05/26/12 10:39 AM      Component Value Range Comment   Sodium 137  135 - 145 mEq/L    Potassium 3.9  3.5 - 5.1 mEq/L    Chloride 101  96 - 112 mEq/L    CO2 27  19 - 32 mEq/L    Glucose, Bld 82  70 - 99 mg/dL    BUN 23  6 - 23 mg/dL    Creatinine, Ser 4.09 (*) 0.50 - 1.10 mg/dL    Calcium 8.9  8.4 - 81.1 mg/dL    GFR calc non Af Amer 40 (*) >90 mL/min    GFR calc Af Amer 47 (*) >90 mL/min   URINALYSIS, ROUTINE W REFLEX MICROSCOPIC     Status: Normal   Collection Time   05/26/12 11:42 AM      Component Value Range Comment   Color, Urine YELLOW  YELLOW     APPearance CLEAR  CLEAR    Specific Gravity, Urine 1.020  1.005 - 1.030    pH 5.5  5.0 - 8.0    Glucose, UA NEGATIVE  NEGATIVE mg/dL    Hgb urine dipstick NEGATIVE  NEGATIVE    Bilirubin Urine NEGATIVE  NEGATIVE    Ketones, ur NEGATIVE  NEGATIVE mg/dL    Protein, ur NEGATIVE  NEGATIVE mg/dL    Urobilinogen, UA 1.0  0.0 - 1.0 mg/dL    Nitrite NEGATIVE  NEGATIVE    Leukocytes, UA NEGATIVE  NEGATIVE MICROSCOPIC NOT DONE ON URINES WITH NEGATIVE PROTEIN, BLOOD, LEUKOCYTES, NITRITE, OR GLUCOSE <1000 mg/dL.  URINE CULTURE     Status: Normal   Collection Time   05/26/12 11:42 AM      Component Value Range Comment   Specimen Description URINE, CLEAN CATCH      Special Requests NONE      Culture  Setup Time 05/26/2012 12:22      Colony Count 9,000 COLONIES/ML      Culture INSIGNIFICANT GROWTH      Report Status 05/27/2012 FINAL     CREATININE, SERUM     Status: Abnormal   Collection Time   05/27/12  5:07 AM      Component Value Range Comment   Creatinine, Ser  1.28 (*) 0.50 - 1.10 mg/dL    GFR calc non Af Amer 48 (*) >90 mL/min    GFR calc Af Amer 56 (*) >90 mL/min      HEENT: normal Cardio: RRR Resp: CTA B/L GI: BS positive Extremity:  No Edema Skin:   Intact Neuro: Alert/Oriented, Cranial Nerve II-XII normal, Abnormal Motor 3-/5 L Delt, Bi, Tri, grip, HF, KE and Ankle DF and Abnormal FMC Ataxic/ dec FMC Musc/Skel:  Normal, mild tenderness over L greater troch of hip Gen:  NAD Mood affect flat  Assessment/Plan: 1. Functional deficits secondary to right internal capsule, thalamus and left insula and deep frontal white matter infarcts, likely thrombotic   which require 3+ hours per day of interdisciplinary therapy in a comprehensive inpatient rehab setting. Physiatrist is providing close team supervision and 24 hour management of active medical problems listed below. Physiatrist and rehab team continue to assess barriers to discharge/monitor patient progress toward functional and  medical goals. FIM: FIM - Bathing Bathing Steps Patient Completed: Chest;Right Arm;Left Arm;Abdomen (Declined to perform with OT, Completed by RN) Bathing:  (refused bath)  FIM - Upper Body Dressing/Undressing Upper body dressing/undressing steps patient completed: Thread/unthread right sleeve of pullover shirt/dresss;Put head through opening of pull over shirt/dress;Pull shirt over trunk;Thread/unthread left sleeve of pullover shirt/dress (declined to remove and replace sports bra today) Upper body dressing/undressing: 5: Supervision: Safety issues/verbal cues FIM - Lower Body Dressing/Undressing Lower body dressing/undressing steps patient completed: Pull pants up/down;Thread/unthread right pants leg;Thread/unthread left pants leg;Don/Doff right sock Lower body dressing/undressing: 2: Max-Patient completed 25-49% of tasks  FIM - Toileting Toileting steps completed by patient: Adjust clothing prior to toileting;Performs perineal hygiene;Adjust clothing after toileting Toileting Assistive Devices: Grab bar or rail for support Toileting: 5: Supervision: Safety issues/verbal cues  FIM - Diplomatic Services operational officer Devices: Grab bars Toilet Transfers: 5-From toilet/BSC: Supervision (verbal cues/safety issues);5-To toilet/BSC: Supervision (verbal cues/safety issues)  FIM - Banker Devices: Walker;Orthosis;Arm rests Bed/Chair Transfer: 5: Bed > Chair or W/C: Supervision (verbal cues/safety issues);5: Chair or W/C > Bed: Supervision (verbal cues/safety issues)  FIM - Locomotion: Wheelchair Distance: 55 Locomotion: Wheelchair: 2: Travels 50 - 149 ft with supervision, cueing or coaxing FIM - Locomotion: Ambulation Locomotion: Ambulation Assistive Devices: Designer, industrial/product Ambulation/Gait Assistance: 4: Min guard Locomotion: Ambulation: 2: Travels 50 - 149 ft with minimal assistance (Pt.>75%)  Comprehension Comprehension Mode:  Asleep Comprehension: 5-Follows basic conversation/direction: With no assist  Expression Expression Mode: Verbal Expression: 5-Expresses basic needs/ideas: With no assist  Social Interaction Social Interaction Mode: Asleep Social Interaction: 6-Interacts appropriately with others with medication or extra time (anti-anxiety, antidepressant).  Problem Solving Problem Solving: 5-Solves complex 90% of the time/cues < 10% of the time  Memory Memory Mode: Asleep Memory: 5-Recognizes or recalls 90% of the time/requires cueing < 10% of the time  Medical Problem List and Plan:  1. DVT Prophylaxis/Anticoagulation: Pharmaceutical: Lovenox  2. Pain Management: N/C  3. Mood: Continue Zoloft 50mg . Flat affect noted. Order neuropsych and have LCSW follow for formal evaluation.  4. Neuropsych: This patient is capable of making decisions on his/her own behalf.  5. HTN: monitor with bid checks. Continue Norvasc, HCTZ and metoprolol. Monitor hydration status on diuretics. Reduce HCTZ due to elevated BUN/Creat.  D/C IV with corrected E lytes and renal status 6. Migraine: continue tramadol for chronic headaches.  7: Hyperlipidemia: On lipitor daily.  8. CKD: at baseline 9. Constipation: . Marland Kitchen Started miralax, D/C'ed reglan--just had a bm now  10.  L hip pain after fall contusion start heat cont PT 11.Delayed gastric emptying continue Reglan no signs of extrapyramidal symptoms LOS (Days) 9 A FACE TO FACE EVALUATION WAS PERFORMED  SWARTZ,ZACHARY T 05/29/2012, 8:36 AM

## 2012-05-30 ENCOUNTER — Inpatient Hospital Stay (HOSPITAL_COMMUNITY): Payer: Medicare Other | Admitting: *Deleted

## 2012-05-30 ENCOUNTER — Inpatient Hospital Stay (HOSPITAL_COMMUNITY): Payer: Medicare Other | Admitting: Occupational Therapy

## 2012-05-30 DIAGNOSIS — G8110 Spastic hemiplegia affecting unspecified side: Secondary | ICD-10-CM

## 2012-05-30 DIAGNOSIS — I6330 Cerebral infarction due to thrombosis of unspecified cerebral artery: Secondary | ICD-10-CM

## 2012-05-30 NOTE — Progress Notes (Signed)
Physical Therapy Discharge Summary  Patient Details  Name: Lindsey [name] MRN: [id number] Date of Birth: 1960-10-18  Today's Date: 05/30/2012 Time: 4098-1191 Time Calculation (min): 55 min  Patient has met 7 of 10 long term goals due to improved activity tolerance, improved balance, increased strength, ability to compensate for deficits, functional use of  left upper extremity and left lower extremity, improved attention and improved coordination.  Patient to discharge at a wheelchair level Modified Independent for household distances and is able to ambulate short distances (~150') with rolling walker and supervision for safety cues.   Patient's care partner is independent to provide the necessary cognitive assistance at discharge.  Reasons goals not met: Decreased endurance, decreased strength and coordination of L UE and L LE; Patient c/o "dizziness and vertigo."  Recommendation:  Patient will benefit from ongoing skilled PT services in home health setting to continue to advance safe functional mobility, address ongoing impairments in strength, endurance, coordination, and balance, and minimize fall risk.  Equipment: wheelchair and rolling walker  Reasons for discharge: discharge from hospital  Patient/family agrees with progress made and goals achieved: Yes  PT Discharge Precautions/Restrictions Precautions Precautions: Fall Precaution Comments: L hemiparesis, h/o "vertigo" per pt  Required Braces or Orthoses: Other Brace/Splint Other Brace/Splint: L hand orthosis on RW Restrictions Weight Bearing Restrictions: No Pain Pain Assessment Pain Assessment: No/denies pain Pain Score: 0-No pain Patients Stated Pain Goal: 3 Multiple Pain Sites: No  Cognition Overall Cognitive Status: Appears within functional limits for tasks assessed Arousal/Alertness: Awake/alert Orientation Level: Oriented X4 Sensation Sensation Light Touch: Appears Intact Proprioception: Appears  Intact Additional Comments: Sensation to light touch intact bilaterally, but patient reports decreased sensation on R LE as compared to L with testing. Coordination Gross Motor Movements are Fluid and Coordinated: No Fine Motor Movements are Fluid and Coordinated: No Coordination and Movement Description: Slight ataxia in L UE and L LE. Finger Nose Finger Test: Accurate, but requires increased time. Heel Shin Test: Impaired, slight dysmetria bilaterally, worse on L LE Motor  Motor Motor: Hemiplegia;Ataxia  Mobility Bed Mobility Bed Mobility: Rolling Right;Rolling Left;Right Sidelying to Sit;Left Sidelying to Sit;Supine to Sit;Sitting - Scoot to [place] of Bed;Sit to Supine;Scooting to [facility] Rolling Right: 7: Independent Rolling Left: 7: Independent Right Sidelying to Sit: 7: Independent Left Sidelying to Sit: 7: Independent Supine to Sit: 7: Independent Sitting - Scoot to Edge of Bed: 7: Independent Sit to Supine: 7: Independent Scooting to HOB: 7: Independent Transfers Sit to Stand: 6: Modified independent (Device/Increase time);With armrests;From bed;With upper extremity assist Stand to Sit: 6: Modified independent (Device/Increase time);With upper extremity assist Stand Pivot Transfers: 6: Modified independent (Device/Increase time) Locomotion  Ambulation Ambulation/Gait Assistance: 5: Supervision Wheelchair Mobility Distance: 75  Trunk/Postural Assessment  Cervical Assessment Cervical Assessment: Within Functional Limits Thoracic Assessment Thoracic Assessment: Within Functional Limits Lumbar Assessment Lumbar Assessment: Within Functional Limits  Balance Balance Balance Assessed: Yes Standardized Balance Assessment Standardized Balance Assessment: Berg Balance Test Berg Balance Test Sit to Stand: Able to stand without using hands and stabilize independently Standing Unsupported: Able to stand safely 2 minutes Sitting with Back Unsupported but Feet Supported on Floor or  Stool: Able to sit safely and securely 2 minutes Stand to Sit: Sits safely with minimal use of hands Transfers: Able to transfer safely, definite need of hands Standing Unsupported with Eyes Closed: Able to stand 10 seconds safely Standing Ubsupported with Feet Together: Able to place feet together independently and stand 1 minute safely From Standing, Reach Forward with  Outstretched Arm: Can reach forward >12 cm safely (5") From Standing Position, Pick up Object from Floor: Able to pick up shoe safely and easily From Standing Position, Turn to Look Behind Over each Shoulder: Looks behind from both sides and weight shifts well Turn 360 Degrees: Able to turn 360 degrees safely but slowly Standing Unsupported, Alternately Place Feet on Step/Stool: Able to complete >2 steps/needs minimal assist Standing Unsupported, One Foot in Front: Able to plae foot ahead of the other independently and hold 30 seconds Standing on One Leg: Tries to lift leg/unable to hold 3 seconds but remains standing independently Total Score: 45 /56, indicating that patient is at a significant risk for falls. Patient's BERG Balance Score upon admission was 34/56; Patient demonstrates 11 point improvement since admission. Extremity Assessment  RLE Assessment RLE Assessment: Within Functional Limits LLE Assessment LLE Assessment: Exceptions to [facility] LLE Strength LLE Overall Strength Comments: Strength grossly 4-/5, 4/5 for L hip flexion and L knee extension  See FIM for current functional status  Keely Drennan S Elise Gladden S. Aydrian Halpin, PT, DPT  05/30/2012, 10:35 AM

## 2012-05-30 NOTE — Progress Notes (Signed)
Occupational Therapy Session Note  Patient Details  Name: Lindsey [name] MRN: [id number] Date of Birth: 1960/09/25  Today's Date: 05/30/2012 Time: 4098-1191 Time Calculation (min): 35 min  Short Term Goals: Week 1:  OT Short Term Goal 1 (Week 1): Pt will complete bathing at min assist level OT Short Term Goal 2 (Week 1): Pt will complete LB dressing with min assist OT Short Term Goal 3 (Week 1): Pt will complete toilet transfers with supervision with LRAD OT Short Term Goal 4 (Week 1): Pt will complete tub/shower transfers with min assist  Skilled Therapeutic Interventions/Progress Updates:    Pt seen for 1:1 OT with focus on functional mobility in home environment with RW and safety in kitchen.  Educated pt on use of tub bench and tub bench transfers for increased safety and independence with tub/shower transfers.  Pt return demonstrated safe transfer technique with distant supervision.  Engaged in simple meal prep with baking cookies she had already prepared in previous session with pt recalling directions and demonstrating RW safety with obtaining items and managing oven door.  Discussed safety with cooking as pt has the desire to assist with Thanksgiving meal with sister.  Therapy Documentation Precautions:  Precautions Precautions: Fall Precaution Comments: L hemiparesis, h/o "vertigo" per pt  Required Braces or Orthoses: Other Brace/Splint Other Brace/Splint: L hand orthosis on RW Restrictions Weight Bearing Restrictions: No General:   Vital Signs: Therapy Vitals Temp: 98.2 F (36.8 C) Temp src: Oral Pulse Rate: 68  Resp: 17  BP: 144/83 mmHg Patient Position, if appropriate: Lying Oxygen Therapy SpO2: 94 % O2 Device: None (Room air) Pain: Pt with no c/o pain this session  See FIM for current functional status  Therapy/Group: Individual Therapy  Leonette Monarch 05/30/2012, 3:38 PM

## 2012-05-30 NOTE — Progress Notes (Signed)
Patient ID: Lindsey [name], female   DOB: 09/13/1960, 51 y.o.   MRN: [id number] 51 y.o. 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 Patient was independent prior to admission without the use of assistive device. Admitted 05/18/2012 after a fall and increased left-sided weakness. MRI of the brain showed acute infarct right internal capsule right thalamus and small area of acute infarct in the left insula and left deep frontal white matter. Patient was on aspirin prior to admission and aggrenox was added  Pt without new c/os OT notes no dizziness  Subjective/Complaints: No new complaints today. Review of Systems  Constitutional: Negative for fever.  Neurological: Positive for sensory change and focal weakness. Negative for seizures.  Psychiatric/Behavioral: Negative for depression.  All other systems reviewed and are negative.    Objective: Vital Signs: Blood pressure 114/80, pulse 61, temperature 98.2 F (36.8 C), temperature source Oral, resp. rate 18, height 4\' 11"  (1.499 m), weight 82.3 kg (181 lb 7 oz), last menstrual period 05/11/2012, SpO2 98.00%. No results found. No results found for this or any previous visit (from the past 72 hour(s)).   HEENT: normal Cardio: RRR Resp: CTA B/L GI: BS positive Extremity:  No Edema Skin:   Intact Neuro: Alert/Oriented, Cranial Nerve II-XII normal, Abnormal Motor 3-/5 L Delt, Bi, Tri, grip, HF, KE and Ankle DF and Abnormal FMC Ataxic/ dec FMC Musc/Skel:  Normal, mild tenderness over L greater troch of hip Gen:  NAD Mood affect flat  Assessment/Plan: 1. Functional deficits secondary to right internal capsule, thalamus and left insula and deep frontal white matter infarcts, likely thrombotic   which require 3+ hours per day of interdisciplinary therapy in a comprehensive inpatient rehab  setting. Physiatrist is providing close team supervision and 24 hour management of active medical problems listed below. Physiatrist and rehab team continue to assess barriers to discharge/monitor patient progress toward functional and medical goals. FIM: FIM - Bathing Bathing Steps Patient Completed: Chest;Right Arm;Left Arm;Abdomen (Declined to perform with OT, Completed by RN) Bathing:  (refused bath)  FIM - Upper Body Dressing/Undressing Upper body dressing/undressing steps patient completed: Thread/unthread right sleeve of pullover shirt/dresss;Put head through opening of pull over shirt/dress;Pull shirt over trunk;Thread/unthread left sleeve of pullover shirt/dress (declined to remove and replace sports bra today) Upper body dressing/undressing: 5: Supervision: Safety issues/verbal cues FIM - Lower Body Dressing/Undressing Lower body dressing/undressing steps patient completed: Pull pants up/down;Thread/unthread right pants leg;Thread/unthread left pants leg;Don/Doff right sock Lower body dressing/undressing: 2: Max-Patient completed 25-49% of tasks  FIM - Toileting Toileting steps completed by patient: Adjust clothing prior to toileting;Performs perineal hygiene;Adjust clothing after toileting Toileting Assistive Devices: Grab bar or rail for support Toileting: 5: Supervision: Safety issues/verbal cues  FIM - Diplomatic Services operational officer Devices: Grab bars;Walker Toilet Transfers: 5-To toilet/BSC: Supervision (verbal cues/safety issues)  FIM - Banker Devices: Therapist, occupational: 5: Sit > Supine: Supervision (verbal cues/safety issues);5: Bed > Chair or W/C: Supervision (verbal cues/safety issues)  FIM - Locomotion: Wheelchair Distance: 55 Locomotion: Wheelchair: 2: Travels 50 - 149 ft with supervision, cueing or coaxing FIM - Locomotion: Ambulation Locomotion: Ambulation Assistive Devices: Dealer Ambulation/Gait Assistance: 4: Min guard Locomotion: Ambulation: 2: Travels 50 - 149 ft with minimal assistance (Pt.>75%)  Comprehension Comprehension Mode: Auditory Comprehension: 5-Follows basic conversation/direction: With no assist  Expression Expression Mode: Verbal Expression: 5-Expresses basic  needs/ideas: With no assist  Social Interaction Social Interaction Mode: Asleep Social Interaction: 6-Interacts appropriately with others with medication or extra time (anti-anxiety, antidepressant).  Problem Solving Problem Solving: 5-Solves basic problems: With no assist  Memory Memory Mode: Asleep Memory: 5-Recognizes or recalls 90% of the time/requires cueing < 10% of the time  Medical Problem List and Plan:  1. DVT Prophylaxis/Anticoagulation: Pharmaceutical: Lovenox  2. Pain Management: N/C  3. Mood: Continue Zoloft 50mg . Flat affect noted. Order neuropsych and have LCSW follow for formal evaluation.  4. Neuropsych: This patient is capable of making decisions on his/her own behalf.  5. HTN: monitor with bid checks. Continue Norvasc, HCTZ and metoprolol. Monitor hydration status on diuretics. Reduce HCTZ due to elevated BUN/Creat.  D/C IV with corrected E lytes and renal status 6. Migraine: continue tramadol for chronic headaches.  7: Hyperlipidemia: On lipitor daily.  8. CKD: at baseline 9. Constipation: . Marland Kitchen Started miralax, D/C'ed reglan--just had a bm now  10.  L hip pain after fall contusion start heat cont PT  LOS (Days) 10 A FACE TO FACE EVALUATION WAS PERFORMED  KIRSTEINS,ANDREW E 05/30/2012, 8:26 AM

## 2012-05-30 NOTE — Progress Notes (Signed)
Social Work Patient ID: Lindsey [name], female   DOB: Nov 17, 1960, 51 y.o.   MRN: [id number] Met with pt to discuss discharge needs.  She is agreeable to DME and follow up therapies.  Her family has been here to go through family education with her and know her care. She requires a lightweight wheelchair to be able to self propel, she is unable to self propel a standard wheelchair and she also performs her self care in the wheelchair.  She is pleased to Be going home tomorrow.

## 2012-05-30 NOTE — Plan of Care (Signed)
Problem: RH Floor Transfers Goal: LTG Patient will perform floor transfers w/assist (PT) LTG: Patient will perform floor transfers with assistance (PT).  Outcome: Not Met (add Reason) Goal unable to be attempted secondary to patient's complaints of "dizziness/vertigo"  Problem: RH Wheelchair Mobility Goal: LTG Patient will propel w/c in controlled environment (PT) LTG: Patient will propel wheelchair in controlled environment, # of feet with assist (PT)  Outcome: Not Met (add Reason) Goal not met secondary to decreased endurance and decreased strength and coordination of L UE and L LE. Goal: LTG Patient will propel w/c in community environment (PT) LTG: Patient will propel wheelchair in community environment, # of feet with assist (PT)  Outcome: Not Met (add Reason) Goal not met secondary to decreased endurance and decreased strength and coordination of L UE and L LE.

## 2012-05-30 NOTE — Progress Notes (Signed)
Occupational Therapy Discharge Summary And Treatment Notes  Patient Details  Name: Lindsey [name] MRN: [id number] Date of Birth: 03-30-1961  Today's Date: 05/30/2012 Time: 0800-0900 and 1115-1200 Time Calculation (min): 60 min and 45 min  1)  Patient in bed upon arrival. Self care retraining to include shower, dress, groom and toileting.  Focused session on bed mobility, ambulate with RW in room to gather supplies, safe toilet & shower transfer, adaptive techniques, attention to tasks due to patient often becomes distracted and need cues to go on with the task.  Patient also slow to process.     2)  LUE exercises to include strengthening of arm and hand.  Patient reports that she has not been do any Thera Putty Exercises in her room as homework we we agreed.  Conducted reassessment for discharge.   Patient has met 10 of 10 long term goals due to improved activity tolerance, improved balance, functional use of  LEFT upper extremity, improved attention and improved coordination.  Patient to discharge at overall Supervision level.  Patient's care partner is independent to provide the necessary physical and cognitive assistance at discharge.    Reasons goals not met: n/a secondary to all LTGs met.  Recommendation:  Patient will benefit from ongoing skilled OT services in home health setting to continue to advance functional skills in the area of BADL, iADL and Reduce care partner burden.  Equipment: tub bench  Reasons for discharge: treatment goals met and discharge from hospital  Patient/family agrees with progress made and goals achieved: Yes  OT Discharge Precautions/Restrictions  Precautions Precautions: Fall Precaution Comments: L hemiparesis, h/o "vertigo" per pt  Restrictions Weight Bearing Restrictions: No Pain Pain Assessment Pain Assessment: No/denies pain ADL: See FIM for updates Vision/Perception  Vision - History Baseline Vision: No visual deficits Patient Visual  Report: No change from baseline (earlier reports of blurriness is resolved) Vision - Assessment Eye Alignment: Within Functional Limits Vision Assessment: Vision not tested Ocular Range of Motion: Within Functional Limits Alignment/Gaze Preference: Within Defined Limits Tracking/Visual Pursuits:  (occasionally looses target for 2-3 sec. in left visual field) Saccades: Decreased speed of saccadic movement Convergence: Impaired (comment) Additional Comments: left eye unalbe to maintain convergence.  Overall patient with limited visual attention. Perception Perception: Within Functional Limits Praxis Praxis: Intact  Cognition Overall Cognitive Status: Appears within functional limits for tasks assessed Arousal/Alertness: Awake/alert Orientation Level: Oriented X4 Focused Attention: Appears intact Sustained Attention: Impaired (easily distracted) Memory: Impaired Memory Impairment: Decreased recall of new information Sensation Sensation Light Touch: Appears Intact Proprioception: Appears Intact Additional Comments: Sensation to light touch intact bilaterally, but patient reports left arm and leg feel "heavy" and just doesn't feel the same with testing. Coordination Gross Motor Movements are Fluid and Coordinated: No Fine Motor Movements are Fluid and Coordinated: No Coordination and Movement Description: Slight ataxia in L UE and L LE. Finger Nose Finger Test: Accurate, but requires increased time. Heel Shin Test: Impaired, slight dysmetria bilaterally, worse on L LE Motor  Motor Motor: Hemiplegia;Ataxia;Motor impersistence Mobility  Bed Mobility overall Mod I Transfers Mod I to Supervision with RW Trunk/Postural Assessment  Cervical Assessment Cervical Assessment: Within Functional Limits Thoracic Assessment Thoracic Assessment: Within Functional Limits Lumbar Assessment Lumbar Assessment: Within Functional Limits  Balance Standardized Balance Assessment Standardized  Balance Assessment: Berg Balance Test performed by PT Total Score: 45  Extremity/Trunk Assessment RUE Assessment RUE Assessment: Within Functional Limits LUE Assessment LUE Assessment: Exceptions to [facility] LUE AROM (degrees) LUE Overall AROM Comments: ROM [facility]  LUE Strength LUE Overall Strength Comments:  LUE is grossly 4/5 except distally 3/5 to include decreased grip  See FIM for current functional status  Arjuna Doeden 05/30/2012, 11:59 AM

## 2012-05-30 NOTE — Progress Notes (Signed)
Timed toileting on going. One incontinent void this shift, otherwise continent all weekend. 2 PRN tylenol given at 0400 for complaint of headache.Lindsey [name] A

## 2012-05-30 NOTE — Progress Notes (Signed)
Recreational Therapy Session Note  Patient Details  Name: Lindsey [name] MRN: [id number] Date of Birth: [birth year] Today's Date: 05/30/2012 Time:  1300-1400 Pain: no c/o Skilled Therapeutic Interventions/Progress Updates: pt propelled w/c using BUE's from the room to kitchen with supervision.  Pt ambulated in kitchen with RW with supervision, Min cues for safety with RW.  Pt stood to retrieve items from shelves with supervision.  Pt required mod cues for reading and then following the written instructions printed on the packaging to bake cookies   Therapy/Group: Individual Therapy   Arriel Victor 05/30/2012, 3:59 PM

## 2012-05-31 DIAGNOSIS — G8110 Spastic hemiplegia affecting unspecified side: Secondary | ICD-10-CM

## 2012-05-31 DIAGNOSIS — I6330 Cerebral infarction due to thrombosis of unspecified cerebral artery: Secondary | ICD-10-CM

## 2012-05-31 MED ORDER — CLOPIDOGREL BISULFATE 75 MG PO TABS: 75.0000 mg | ORAL_TABLET | Freq: Every day | ORAL | Status: DC

## 2012-05-31 MED ORDER — ASPIRIN EC 81 MG PO TBEC: 81.0000 mg | DELAYED_RELEASE_TABLET | Freq: Every day | ORAL | Status: DC

## 2012-05-31 MED ORDER — POLYSACCHARIDE IRON COMPLEX 150 MG PO CAPS: 150.0000 mg | ORAL_CAPSULE | Freq: Two times a day (BID) | ORAL | Status: DC

## 2012-05-31 MED ORDER — POLYETHYLENE GLYCOL 3350 17 G PO PACK: 17.0000 g | PACK | Freq: Two times a day (BID) | ORAL | Status: DC

## 2012-05-31 MED ORDER — HYDROCHLOROTHIAZIDE 25 MG PO TABS: 12.5000 mg | ORAL_TABLET | Freq: Every day | ORAL | Status: DC

## 2012-05-31 NOTE — Progress Notes (Signed)
Patient discharge to home with daughter at 37.  Discharge instruction given by Delle Reining, PA.  Patient/daughter verbalize understanding with no further questions.  Patient escorted off unit with all her belonging in a cart by rehab NT.

## 2012-05-31 NOTE — Progress Notes (Signed)
Patient ID: Lindsey [name], female   DOB: 05-04-1961, 51 y.o.   MRN: [id number] 51 y.o. 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 Patient was independent prior to admission without the use of assistive device. Admitted 05/18/2012 after a fall and increased left-sided weakness. MRI of the brain showed acute infarct right internal capsule right thalamus and small area of acute infarct in the left insula and left deep frontal white matter. Patient was on aspirin prior to admission and aggrenox was added  Pt without new c/os OT notes no dizziness  Subjective/Complaints: No new complaints today. Review of Systems  Constitutional: Negative for fever.  Neurological: Positive for sensory change and focal weakness. Negative for seizures.  Psychiatric/Behavioral: Negative for depression.  All other systems reviewed and are negative.    Objective: Vital Signs: Blood pressure 127/89, pulse 98, temperature 97.3 F (36.3 C), temperature source Oral, resp. rate 18, height 4\' 11"  (1.499 m), weight 82.3 kg (181 lb 7 oz), last menstrual period 05/11/2012, SpO2 99.00%. No results found. No results found for this or any previous visit (from the past 72 hour(s)).   HEENT: normal Cardio: RRR Resp: CTA B/L GI: BS positive Extremity:  No Edema Skin:   Intact Neuro: Alert/Oriented, Cranial Nerve II-XII normal, Abnormal Motor 3-/5 L Delt, Bi, Tri, grip, HF, KE and Ankle DF and Abnormal FMC Ataxic/ dec FMC Musc/Skel:  Normal, mild tenderness over L greater troch of hip Gen:  NAD Mood affect flat  Assessment/Plan: 1. Functional deficits secondary to right internal capsule, thalamus and left insula and deep frontal white matter infarcts, likely thrombotic medically stable for D/C PMR f/u Neuro f/u PCP f/u FIM: FIM - Bathing Bathing Steps Patient  Completed: Chest;Right Arm;Left Arm;Abdomen;Front perineal area;Buttocks;Right upper leg;Left upper leg;Right lower leg (including foot);Left lower leg (including foot) Bathing: 5: Supervision: Safety issues/verbal cues  FIM - Upper Body Dressing/Undressing Upper body dressing/undressing steps patient completed: Thread/unthread right sleeve of pullover shirt/dresss;Put head through opening of pull over shirt/dress;Pull shirt over trunk;Thread/unthread left sleeve of pullover shirt/dress;Thread/unthread right bra strap;Thread/unthread left bra strap;Hook/unhook bra (declined to remove and replace sports bra today) Upper body dressing/undressing: 5: Supervision: Safety issues/verbal cues FIM - Lower Body Dressing/Undressing Lower body dressing/undressing steps patient completed: Pull pants up/down;Thread/unthread right pants leg;Thread/unthread left pants leg;Don/Doff right sock;Thread/unthread right underwear leg;Thread/unthread left underwear leg;Pull underwear up/down;Don/Doff right shoe Lower body dressing/undressing: 4: Min-Patient completed 75 plus % of tasks  FIM - Toileting Toileting steps completed by patient: Adjust clothing prior to toileting;Performs perineal hygiene;Adjust clothing after toileting Toileting Assistive Devices: Grab bar or rail for support Toileting: 6: More than reasonable amount of time  FIM - Diplomatic Services operational officer Devices: Grab bars;Walker Toilet Transfers: 5-To toilet/BSC: Supervision (verbal cues/safety issues)  FIM - Banker Devices: Environmental consultant;Arm rests Bed/Chair Transfer: 6: More than reasonable amt of time;6: Supine > Sit: No assist;6: Sit > Supine: No assist;6: Bed > Chair or W/C: No assist;6: Chair or W/C > Bed: No assist  FIM - Locomotion: Wheelchair Distance: 75 Locomotion: Wheelchair: 2: Travels 50 - 149 ft with supervision, cueing or coaxing FIM - Locomotion: Ambulation Locomotion: Ambulation  Assistive Devices: Designer, industrial/product Ambulation/Gait Assistance: 5: Supervision Locomotion: Ambulation: 5: Travels 150 ft or more with supervision/safety issues  Comprehension Comprehension Mode: Auditory Comprehension: 5-Understands basic 90% of the time/requires cueing < 10% of the time  Expression Expression Mode: Verbal Expression: 5-Expresses basic needs/ideas: With extra time/assistive device  Social Interaction Social Interaction Mode: Asleep Social Interaction: 6-Interacts appropriately with others with medication or extra time (anti-anxiety, antidepressant).  Problem Solving Problem Solving: 5-Solves basic 90% of the time/requires cueing < 10% of the time  Memory Memory Mode: Asleep Memory: 5-Recognizes or recalls 90% of the time/requires cueing < 10% of the time  Medical Problem List and Plan:  1. DVT Prophylaxis/Anticoagulation: Pharmaceutical: Lovenox  2. Pain Management: N/C  3. Mood: Continue Zoloft 50mg . Flat affect noted. Order neuropsych and have LCSW follow for formal evaluation.  4. Neuropsych: This patient is capable of making decisions on his/her own behalf.  5. HTN: monitor with bid checks. Continue Norvasc, HCTZ and metoprolol. Monitor hydration status on diuretics. Reduce HCTZ due to elevated BUN/Creat.  D/C IV with corrected E lytes and renal status 6. Migraine: continue tramadol for chronic headaches.  7: Hyperlipidemia: On lipitor daily.  8. CKD: at baseline 9. Constipation: . Marland Kitchen Started miralax, D/C'ed reglan--just had a bm now  10.  L hip pain after fall contusion start heat cont PT  LOS (Days) 11 A FACE TO FACE EVALUATION WAS PERFORMED  KIRSTEINS,ANDREW E 05/31/2012, 8:32 AM

## 2012-05-31 NOTE — Progress Notes (Signed)
Social Work Discharge Note Discharge Note  The overall goal for the admission was met for:   Discharge location: Yes-HOME TO DAUGHTER'S HOME WITH 24 HR CARE  Length of Stay: Yes-11 DAYS  Discharge activity level: Yes-SUPERVISION/MIN LEVEL  Home/community participation: Yes  Services provided included: MD, RD, PT, OT, SLP, RN, TR, Pharmacy, Neuropsych and SW  Financial Services: Medicare  Follow-up services arranged: Outpatient: [facility]-12/3 10:00-11;15 OPPT, DME: [facility]-TUB BENCH, ROLLING WALKER, WHEELCHAIR and Patient/Family has no preference for HH/DME agencies  Comments (or additional information):  Patient/Family verbalized understanding of follow-up arrangements: Yes  Individual responsible for coordination of the follow-up plan: SELF & DAUGHTER  Confirmed correct DME delivered: Lucy Chris 05/31/2012    Lucy Chris

## 2012-05-31 NOTE — Progress Notes (Signed)
Discharge summary # [phone]

## 2012-05-31 NOTE — Progress Notes (Signed)
Recreational Therapy Discharge Summary Patient Details  Name: CHAUNTELL CREDIT MRN: [id number] Date of Birth: May 31, 1961 Today's Date: 05/31/2012  Long term goals set: 1  Long term goals met: 1  Comments on progress toward goal:  Pt is ready for discharge home today with family to provide 24 hour supervision.  Pt participated in community reintegration activity on hospital grounds with focus on community mobility, identification & negotiation of obstacles, energy conservation techniques, and general safety with supervision-min assist.  Pt requires supervision for TR tasks due to need for verbal cues for safety. Reasons for discharge: discharge from hospital  Patient/family agrees with progress made and goals achieved: Yes  Adler Alton 05/31/2012, 10:57 AM

## 2012-06-01 NOTE — Discharge Summary (Signed)
NAMETONIYAH, SALVAGGIO                ACCOUNT NO.:  [id number]  MEDICAL RECORD NO.:  [id number]  LOCATION:  4149                         FACILITY:  [facility]  PHYSICIAN:  Erick Colace, M.D.DATE OF BIRTH:  01/10/1961  DATE OF ADMISSION:  05/20/2012 DATE OF DISCHARGE:  05/31/2012                              DISCHARGE SUMMARY   DISCHARGE DIAGNOSES: 1. 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 2. History of migraines. 3. Chronic kidney disease. 4. Chronic constipation. 5. Lethargy, resolved.  HISTORY OF PRESENT ILLNESS:  Mr. Latonja Bressan is a 51 year old right- handed female with a history of hypertension, chronic kidney disease, as well as multiple recurrent ischemic and hemorrhagic strokes, which have affected her left side.  She was admitted on May 18, 2012, with increased left-sided weakness and fall.  MRI of brain showed acute infarct in right internal capsule, right thalamus, and small area of acute infarct in the left insula and left deep frontal white matter. The patient was evaluated by Neurology who recommended TEE and outpatient Holter monitor as well as changing the patient to Plavix for CVA prophylaxis.  Carotid Dopplers done showed no ICA stenosis.  Cardiac echo done revealing EF of 60%-65%, No wall abnormality, Mild LVH and grade 1 diastolic dysfunction.  The patient was noted to have worsening of renal insufficiency, therefore ACE was discontinued and IV fluids were initiated for hydration.  The patient has been participating in therapy.  Therapy team has recommended CIR for progression.  PAST MEDICAL HISTORY:  Hypertension, chronic kidney disease, neuropathy, sleep apnea, Chiari 1 malformation, hyperlipidemia, history of multiple CVA, GERD, migraines, daily headaches.  FUNCTIONAL HISTORY:  The patient was independent, but sedentary prior to admission.  FUNCTIONAL STATUS:  The  patient required +2 total assist 50% for sit to stand transfers and ambulating 3 feet.  She required manual assist for upright posture and for lower extremity advancement.  She required max assist for upper body bathing and dressing.  +2 total assist for lower body care.  Reported dizziness with ADL tasks.  HOSPITAL COURSE:  Ms. Anupama Bloom was admitted to Rehab on May 20, 2012, for inpatient therapies to consist of PT, OT at least 3 hours 5 days a week.  Past admission physiatrist, rehab, RN, and therapy team have worked together to provide customized collaborative interdisciplinary care.  The patient's blood pressures were monitored on b.i.d. basis, and these have been reasonably controlled; p.o. intake has been good.  At the time of admission, the patient has had problems with nausea, vomiting, as well as dizziness.  She reported chronic constipation problems and was started on bowel program.  Reglan was discontinued, which has helped in the patient's energy levels.  Followup labs were done, is showing her creatinine to slowly improve.  Most recent check of lytes revealed sodium 137, potassium 3.9, chloride 101, CO2 27, BUN 23, creatinine 1.48, glucose 82.  CBC at admission revealed H and H at 10.9 and 30.5, white count 8.2, platelets 282.  The patient's iron supplements was increased to b.i.d. basis.  Due to problems with lethargy, urine culture was done and  showed 9000 colonies and significant growth.  The patient's mentation has slowly improved.  She was noted to have flat affect with question of adjustment disorder versus depression.  Dr. Wylene Simmer, neuropsychologist was consulted for input.  Dr. Wylene Simmer felt the patient's current depressed mood was secondary to be bereavement rather than underlying depressive disorder. It was recommended that she follow up in bereavement support group.  During the patient's stay in rehab, weekly team conferences were held to monitor the  patient's progress, set goals, as well as discuss barriers to discharge.  Physical Therapy has worked with patient on activity tolerance, balance strengthening as well as improving functional use of left upper and lower extremity.  The patient is showing improvement in attention and coordination.  The patient is modified independent at wheelchair level.  She is able to ambulate approximately 150 feet with rolling walker with supervision and cues related to safety.  Her Berg balance score has improved to 45/56.  OT has worked with the patient on self-care task as well as left upper extremity exercises for strengthening of arm and hand.  The patient is able to gather supplies at supervision level.  She does become distracted and needs cues to complete tasks.  She has been limited by problems with ongoing vertigo. Further followup outpatient eval to vestibular therapy to continue past discharge.  On May 31, 2012, the patient is discharged to home in improved condition.  DISCHARGE MEDICATIONS:  Plavix 75 mg p.o. per day, MiraLAX 17 g in 8 ounces p.o. b.i.d., hydrochlorothiazide 12.5 mg p.o. per day, Niferex 150 mg p.o. b.i.d., Tylenol 500 mg p.o. q.6 hours p.r.n. pain, albuterol inhaler 2 puffs q.6 hours p.r.n. shortness of breath, Norvasc 10 mg a day, aspirin 81 mg p.o. per day, Zoloft 50 mg p.o. per day, Toprol-XL 50 mg p.o. per day, Neurontin 300 mg p.o. at bedtime, Zocor 40 mg at bedtime, Ultram 50 mg p.o. at bedtime.  DIET:  Low fat, low cholesterol.  ACTIVITY LEVEL:  24-hour supervision assistance.  Walk with walker and assistance.  SPECIAL INSTRUCTIONS:  Outpatient physical therapy at neuro rehab beginning on Tuesday, June 07, 2012, at 10 a.m.  FOLLOWUP:  The patient to follow up with Dr. Rodman Pickle on June 08, 2012, at 2:15 for posthospital checkup as well as referral to Cardiology for outpatient cardiac Holter monitor.  Follow up with Dr. [name] in 6 weeks.   Follow up with Dr. Claudette Laws on June 14, 2012, at 10 am.     Delle Reining, P.A.   ______________________________ Erick Colace, M.D.    PL/MEDQ  D:  05/31/2012  T:  06/01/2012  Job:  [id number]  cc:   Pramod P. [name], MD Rodman Pickle, MD

## 2012-06-06 ENCOUNTER — Telehealth: Payer: Self-pay

## 2012-06-06 NOTE — Telephone Encounter (Signed)
Maxine called to follow up on physical therapy order for patient.  She is going to contact [employer].

## 2012-06-07 ENCOUNTER — Ambulatory Visit: Payer: Medicare Other | Attending: Physical Medicine & Rehabilitation | Admitting: Physical Therapy

## 2012-06-08 ENCOUNTER — Ambulatory Visit (INDEPENDENT_AMBULATORY_CARE_PROVIDER_SITE_OTHER): Payer: Medicare Other | Admitting: Family Medicine

## 2012-06-08 ENCOUNTER — Encounter: Payer: Self-pay | Admitting: Family Medicine

## 2012-06-08 VITALS — BP 163/99 | HR 82 | Temp 98.3°F | Ht 59.0 in | Wt 191.0 lb

## 2012-06-08 DIAGNOSIS — I6350 Cerebral infarction due to unspecified occlusion or stenosis of unspecified cerebral artery: Secondary | ICD-10-CM

## 2012-06-08 DIAGNOSIS — I10 Essential (primary) hypertension: Secondary | ICD-10-CM

## 2012-06-08 DIAGNOSIS — I639 Cerebral infarction, unspecified: Secondary | ICD-10-CM

## 2012-06-08 MED ORDER — TRAMADOL HCL 50 MG PO TABS: 50.0000 mg | ORAL_TABLET | Freq: Four times a day (QID) | ORAL | Status: DC | PRN

## 2012-06-08 MED ORDER — HYDROCHLOROTHIAZIDE 25 MG PO TABS: 12.5000 mg | ORAL_TABLET | Freq: Every day | ORAL | Status: DC

## 2012-06-08 MED ORDER — CLOPIDOGREL BISULFATE 75 MG PO TABS: 75.0000 mg | ORAL_TABLET | Freq: Every day | ORAL | Status: DC

## 2012-06-08 MED ORDER — AMLODIPINE BESYLATE 10 MG PO TABS: 10.0000 mg | ORAL_TABLET | Freq: Every day | ORAL | Status: DC

## 2012-06-08 MED ORDER — GABAPENTIN 100 MG PO CAPS: 300.0000 mg | ORAL_CAPSULE | Freq: Every day | ORAL | Status: DC

## 2012-06-08 MED ORDER — TRAZODONE HCL 50 MG PO TABS: 50.0000 mg | ORAL_TABLET | Freq: Every day | ORAL | Status: DC

## 2012-06-08 MED ORDER — ALBUTEROL SULFATE HFA 108 (90 BASE) MCG/ACT IN AERS: 2.0000 | INHALATION_SPRAY | Freq: Four times a day (QID) | RESPIRATORY_TRACT | Status: DC | PRN

## 2012-06-08 MED ORDER — SERTRALINE HCL 50 MG PO TABS: 50.0000 mg | ORAL_TABLET | Freq: Every day | ORAL | Status: DC

## 2012-06-08 MED ORDER — POLYETHYLENE GLYCOL 3350 17 G PO PACK: 17.0000 g | PACK | Freq: Two times a day (BID) | ORAL | Status: DC

## 2012-06-08 MED ORDER — SIMVASTATIN 40 MG PO TABS: 40.0000 mg | ORAL_TABLET | Freq: Every day | ORAL | Status: DC

## 2012-06-08 MED ORDER — POLYSACCHARIDE IRON COMPLEX 150 MG PO CAPS: 150.0000 mg | ORAL_CAPSULE | Freq: Two times a day (BID) | ORAL | Status: DC

## 2012-06-08 MED ORDER — METOPROLOL SUCCINATE ER 50 MG PO TB24: 50.0000 mg | ORAL_TABLET | ORAL | Status: DC

## 2012-06-08 NOTE — Assessment & Plan Note (Signed)
BP elevated in office today. She has not been to clinic in >1 year and her medication refills were expired therefore she has not been taking anything. Will refill all prior medications today and RTC in 2 weeks for BP check. Will also send for 24 hour holter monitoring given her stroke to check for transient A.fib or arrhthymias.

## 2012-06-08 NOTE — Assessment & Plan Note (Signed)
Hospital follow up appointment but there are still many things to be sorted out. She has been non-compliant with medications since hospital discharge. Will refill all medications, but she is strongly encouraged to keep appointment with neurology to discuss ASA vs. Plavix. Also, she should reschedule her therapy appointment since it appears she missed that yesterday. Overall, patient seems to be doing very well.

## 2012-06-08 NOTE — Progress Notes (Signed)
Patient ID: Lindsey [name], female   DOB: May 20, 1961, 51 y.o.   MRN: [id number] [name] Amber M. Hairford, MD Phone: [phone]  Subjective: HPI: Patient is a 51 y.o. female presenting to clinic today for hospital follow up. Patient was admitted for stroke on November 13. She was evaluated by neurology during that admission, and was discharged to inpatient rehab for further reconditioning. She was discharged from rehab and following up today for her first appointment.   Overall, she states she is doing well. She gets along at home with a walker and feels like she is able to do most of her daily activities it just takes her longer. Her daughter is present in the exam room and states she is concerned because her mother is "out of all of her medications." Although she was discharged recently from the hospital, no medications were refilled. She was sent home on Plavix (as well as ASA 81mg ) and iron. Patient was encouraged to follow up with me mostly for holter monitoring.   At this time, patient has no complaints. No new weakness, numbness, tingling, difficulty speaking, headaches, changes in vision or pain in her chest.   History Reviewed: Non smoker. Health Maintenance: UTD  ROS: Please see HPI above.  Objective: Office vital signs reviewed.  Physical Examination:  General: Awake, alert. NAD. HEENT: Atraumatic, normocephalic.  Neck: Supple. No masses palpated. No LAD Pulm: CTAB, no wheezes Cardio: RRR, no murmurs appreciated Abdomen:+BS, soft, nontender Extremities: No edema Neuro: CN grossly intact. RUE 5/5 strength, LUE 4/5 strength. Ambulates with walker. No sensory loss.   Assessment: 51 yo F hospital follow up for stroke  Plan: See Problem List and After Visit Summary

## 2012-06-08 NOTE — Patient Instructions (Signed)
It was good to see you. I am glad you are feeling a little better.  I will send referral for cardiology and they will call you with appointment.  I have a refilled all of your medications. It is VERY important to take all of your medicines. Please keep your appointment with Neurology.  Come back to see me in 2-3 weeks to follow up your blood pressure while taking your medications.  Take care! Fernado Brigante M. Radonna Bracher, M.D.

## 2012-06-09 ENCOUNTER — Telehealth: Payer: Self-pay | Admitting: *Deleted

## 2012-06-09 NOTE — Telephone Encounter (Signed)
Sent request to [facility] to schedule pt for monitor.  

## 2012-06-09 NOTE — Telephone Encounter (Signed)
Message copied by Leotis Pain on Thu Jun 09, 2012  4:55 PM ------      Message from: Damita Lack      Created: Wed Jun 08, 2012  3:52 PM       Please schedule Holter Monitor.  Orders entered by Dr. Mikel Cella.                  Thanks,      Terese Door, CMA

## 2012-06-13 ENCOUNTER — Telehealth: Payer: Self-pay | Admitting: Family Medicine

## 2012-06-13 NOTE — Telephone Encounter (Signed)
Left message for patient to call and schedule 24 hour holter monitor ordered by [employer].

## 2012-06-14 ENCOUNTER — Inpatient Hospital Stay (HOSPITAL_COMMUNITY)
Admission: EM | Admit: 2012-06-14 | Discharge: 2012-06-17 | DRG: 066 | Disposition: A | Discharge status: Home / Self Care | Payer: Medicare Other | Attending: Family Medicine | Admitting: Family Medicine

## 2012-06-14 ENCOUNTER — Inpatient Hospital Stay: Payer: Medicare Other | Admitting: Physical Medicine & Rehabilitation

## 2012-06-14 ENCOUNTER — Encounter: Payer: Medicare Other | Attending: Physical Medicine & Rehabilitation

## 2012-06-14 ENCOUNTER — Encounter (HOSPITAL_COMMUNITY): Payer: Self-pay | Admitting: Emergency Medicine

## 2012-06-14 DIAGNOSIS — I729 Aneurysm of unspecified site: Secondary | ICD-10-CM

## 2012-06-14 DIAGNOSIS — Z79899 Other long term (current) drug therapy: Secondary | ICD-10-CM

## 2012-06-14 DIAGNOSIS — Z7902 Long term (current) use of antithrombotics/antiplatelets: Secondary | ICD-10-CM

## 2012-06-14 DIAGNOSIS — Z8249 Family history of ischemic heart disease and other diseases of the circulatory system: Secondary | ICD-10-CM

## 2012-06-14 DIAGNOSIS — Z91041 Radiographic dye allergy status: Secondary | ICD-10-CM | POA: Diagnosis present

## 2012-06-14 DIAGNOSIS — G8929 Other chronic pain: Secondary | ICD-10-CM | POA: Diagnosis present

## 2012-06-14 DIAGNOSIS — Z7982 Long term (current) use of aspirin: Secondary | ICD-10-CM

## 2012-06-14 DIAGNOSIS — I10 Essential (primary) hypertension: Secondary | ICD-10-CM | POA: Diagnosis present

## 2012-06-14 DIAGNOSIS — Z8673 Personal history of transient ischemic attack (TIA), and cerebral infarction without residual deficits: Secondary | ICD-10-CM

## 2012-06-14 DIAGNOSIS — E785 Hyperlipidemia, unspecified: Secondary | ICD-10-CM | POA: Diagnosis present

## 2012-06-14 DIAGNOSIS — R2990 Unspecified symptoms and signs involving the nervous system: Secondary | ICD-10-CM

## 2012-06-14 DIAGNOSIS — R29898 Other symptoms and signs involving the musculoskeletal system: Secondary | ICD-10-CM | POA: Diagnosis present

## 2012-06-14 DIAGNOSIS — I6350 Cerebral infarction due to unspecified occlusion or stenosis of unspecified cerebral artery: Principal | ICD-10-CM | POA: Diagnosis present

## 2012-06-14 DIAGNOSIS — K219 Gastro-esophageal reflux disease without esophagitis: Secondary | ICD-10-CM | POA: Diagnosis present

## 2012-06-14 DIAGNOSIS — I639 Cerebral infarction, unspecified: Secondary | ICD-10-CM | POA: Diagnosis present

## 2012-06-14 LAB — COMPREHENSIVE METABOLIC PANEL
ALT: 11 U/L (ref 0–35)
AST: 14 U/L (ref 0–37)
CO2: 24 mEq/L (ref 19–32)
Calcium: 9.9 mg/dL (ref 8.4–10.5)
Creatinine, Ser: 1.51 mg/dL — ABNORMAL HIGH (ref 0.50–1.10)
GFR calc Af Amer: 46 mL/min — ABNORMAL LOW (ref >90)
GFR calc non Af Amer: 40 mL/min — ABNORMAL LOW (ref >90)
Sodium: 140 mEq/L (ref 135–145)
Total Protein: 8.1 g/dL (ref 6.0–8.3)

## 2012-06-14 LAB — CBC
HCT: 33.3 % — ABNORMAL LOW (ref 36.0–46.0)
MCH: 28.9 pg (ref 26.0–34.0)
MCHC: 35.7 g/dL (ref 30.0–36.0)
MCV: 80.8 fL (ref 78.0–100.0)
Platelets: 310 10*3/uL (ref 150–400)
RDW: 16 % — ABNORMAL HIGH (ref 11.5–15.5)

## 2012-06-14 LAB — DIFFERENTIAL
Basophils Absolute: 0 10*3/uL (ref 0.0–0.1)
Eosinophils Absolute: 0.1 10*3/uL (ref 0.0–0.7)
Eosinophils Relative: 0 % (ref 0–5)
Monocytes Absolute: 0.5 10*3/uL (ref 0.1–1.0)

## 2012-06-14 LAB — POCT I-STAT, CHEM 8
BUN: 27 mg/dL — ABNORMAL HIGH (ref 6–23)
Calcium, Ion: 1.25 mmol/L — ABNORMAL HIGH (ref 1.12–1.23)
Chloride: 107 mEq/L (ref 96–112)
Glucose, Bld: 93 mg/dL (ref 70–99)
TCO2: 26 mmol/L (ref 0–100)

## 2012-06-14 LAB — PROTIME-INR: Prothrombin Time: 13.3 seconds (ref 11.6–15.2)

## 2012-06-14 NOTE — ED Notes (Signed)
EKG performed by NT upon arrival to room;

## 2012-06-14 NOTE — ED Notes (Signed)
PT. REPORTS LEFT ANKLE AND LEFT FOOT NUMBNESS ONSET THIS MORNING , STATES HISTORY OF STROKE 2 WEEKS AGO , SPEECH CLEAR / NO FACIAL ASYMMETRY , NO ARM DRIFT.

## 2012-06-14 NOTE — ED Provider Notes (Signed)
History     CSN: [id number]  Arrival date & time 06/14/12  9147   First MD Initiated Contact with Patient 06/14/12 2215      Chief Complaint  Patient presents with  . Numbness    (Consider location/radiation/quality/duration/timing/severity/associated sxs/prior treatment) HPI Comments: 51 year old female recently discharged from the hospital at the end of November after having a stroke presents to the emergency department complaining of left-sided weakness beginning last night. Patient states she stood up and fell the ground because the left side of her body felt weak. This morning she woke up and had numbness in her left leg. When she was discharged from nursing rehabilitation patient was feeling much better and able to walk with a walker. Today she is having more difficulty walking with her walker. At first she thought it was her vertigo acting up, however the medication she was given for this which has been helping did not help yesterday or today. None of her symptoms have been returned until last night. Denies any confusion, difficulty speaking, chest pain, shortness of breath. She has been taking all her medications as prescribed in the hospital.  The history is provided by the patient.    Past Medical History  Diagnosis Date  . Hypertension   . Chronic kidney disease   . Neuropathy   . Sleep apnea   . Chiari I malformation   . Hyperlipidemia   . CVA (cerebral infarction)   . GERD (gastroesophageal reflux disease)   . Migraine   . Daily headache     "lately" (05/18/12)  . Stroke 2008;  ?2009;     "twice; once    Past Surgical History  Procedure Date  . Reduction mammaplasty ~ 2002  . Cesarean section 8295; 1983; 1989  . Craniectomy suboccipital w/ cervical laminectomy / chiari 2000's  . Brain surgery     Family History  Problem Relation Age of Onset  . Hyperlipidemia Mother   . Heart disease Mother   . Hypertension Mother   . Heart disease Sister   . Heart  disease Brother     History  Substance Use Topics  . Smoking status: Never Smoker   . Smokeless tobacco: Never Used  . Alcohol Use: No    OB History    Grav Para Term Preterm Abortions TAB SAB Ect Mult Living                  Review of Systems  Constitutional: Positive for activity change. Negative for fever and chills.  HENT: Negative for neck pain and neck stiffness.   Eyes: Negative for visual disturbance.  Respiratory: Negative for shortness of breath.   Cardiovascular: Negative for chest pain.  Gastrointestinal: Negative.   Genitourinary: Negative for difficulty urinating.  Musculoskeletal: Negative.   Skin: Negative.   Neurological: Positive for dizziness, weakness and numbness. Negative for syncope, facial asymmetry and speech difficulty.  Psychiatric/Behavioral: Negative for confusion.    Allergies  Iodine  Home Medications   Current Outpatient Rx  Name  Route  Sig  Dispense  Refill  . ALBUTEROL SULFATE HFA 108 (90 BASE) MCG/ACT IN AERS   Inhalation   Inhale 2 puffs into the lungs every 6 (six) hours as needed. For shortness of breath   1 each   5   . AMLODIPINE BESYLATE 10 MG PO TABS   Oral   Take 1 tablet (10 mg total) by mouth daily.   30 tablet   5   . ASPIRIN EC 81  MG PO TBEC   Oral   Take 1 tablet (81 mg total) by mouth daily.         Marland Kitchen CLOPIDOGREL BISULFATE 75 MG PO TABS   Oral   Take 1 tablet (75 mg total) by mouth daily with breakfast. New blood thinner   30 tablet   1   . GABAPENTIN 100 MG PO CAPS   Oral   Take 3 capsules (300 mg total) by mouth at bedtime.   90 capsule   5   . HYDROCHLOROTHIAZIDE 25 MG PO TABS   Oral   Take 0.5 tablets (12.5 mg total) by mouth daily.         Marland Kitchen POLYSACCHARIDE IRON COMPLEX 150 MG PO CAPS   Oral   Take 1 capsule (150 mg total) by mouth 2 (two) times daily. For anemia   60 capsule   1   . METOPROLOL SUCCINATE ER 50 MG PO TB24   Oral   Take 1 tablet (50 mg total) by mouth every morning.    30 tablet   5   . POLYETHYLENE GLYCOL 3350 PO PACK   Oral   Take 17 g by mouth 2 (two) times daily.   14 each      . SERTRALINE HCL 50 MG PO TABS   Oral   Take 1 tablet (50 mg total) by mouth daily.   30 tablet   5   . SIMVASTATIN 40 MG PO TABS   Oral   Take 1 tablet (40 mg total) by mouth at bedtime.   30 tablet   5   . TRAMADOL HCL 50 MG PO TABS   Oral   Take 1 tablet (50 mg total) by mouth every 6 (six) hours as needed. For pain   30 tablet   5   . TRAZODONE HCL 50 MG PO TABS   Oral   Take 1 tablet (50 mg total) by mouth at bedtime.   30 tablet   5     BP 143/95  Pulse 64  Temp 98.7 F (37.1 C) (Oral)  Resp 20  SpO2 100%  LMP 05/11/2012  Physical Exam  Nursing note and vitals reviewed. Constitutional: She is oriented to person, place, and time. She appears well-developed and well-nourished. No distress.  HENT:  Head: Normocephalic and atraumatic.  Mouth/Throat: Uvula is midline and oropharynx is clear and moist.  Eyes: Conjunctivae normal and EOM are normal. Pupils are equal, round, and reactive to light.  Neck: Normal range of motion. Neck supple.  Cardiovascular: Normal rate, regular rhythm, normal heart sounds and intact distal pulses.   Pulmonary/Chest: Effort normal and breath sounds normal.  Abdominal: Soft. Bowel sounds are normal. There is no tenderness.  Musculoskeletal: Normal range of motion. She exhibits no edema.  Neurological: She is alert and oriented to person, place, and time. A sensory deficit (decreased sensation to light touch on left upper and lower extremity compared to right) is present. No cranial nerve deficit.       Decreased strength 4/5 left upper and lower extremity compared to right 5/5. Slight pronator drift on left.  Skin: Skin is warm and dry.  Psychiatric: She has a normal mood and affect. Her speech is normal and behavior is normal. Cognition and memory are normal.    ED Course  Procedures (including critical care  time)  Labs Reviewed  CBC - Abnormal; Notable for the following:    WBC 13.8 (*)     Hemoglobin 11.9 (*)  HCT 33.3 (*)     RDW 16.0 (*)     All other components within normal limits  DIFFERENTIAL - Abnormal; Notable for the following:    Neutrophils Relative 79 (*)     Neutro Abs 10.9 (*)     All other components within normal limits  COMPREHENSIVE METABOLIC PANEL - Abnormal; Notable for the following:    BUN 25 (*)     Creatinine, Ser 1.51 (*)     Total Bilirubin 0.2 (*)     GFR calc non Af Amer 40 (*)     GFR calc Af Amer 46 (*)     All other components within normal limits  POCT I-STAT, CHEM 8 - Abnormal; Notable for the following:    BUN 27 (*)     Creatinine, Ser 1.60 (*)     Calcium, Ion 1.25 (*)     All other components within normal limits  PROTIME-INR  APTT  TROPONIN I  URINE RAPID DRUG SCREEN (HOSP PERFORMED)   Date: 06/15/2012  Rate: 66  Rhythm: normal sinus rhythm  QRS Axis: normal  Intervals: normal  ST/T Wave abnormalities: nonspecific T wave changes  Conduction Disutrbances:none  Narrative Interpretation: sinus rhythm, borderline T abnormalities, lateral leads, no stemi.  Old EKG Reviewed: unchanged   Ct Head Wo Contrast  06/15/2012  *RADIOLOGY REPORT*  Clinical Data: Left ankle and left foot numbness.  History of stroke 2 weeks ago.  CT HEAD WITHOUT CONTRAST  Technique:  Contiguous axial images were obtained from the base of the skull through the vertex without contrast.  Comparison: MRI brain 05/18/2012.  CT head 05/18/2012.  Findings: Mild diffuse cerebral atrophy.  Low attenuation change in the deep white matter consistent with small vessel ischemia.  Old lacunar infarcts in the basal ganglia and thalami.  Increasing low attenuation change in the right thalamus likely corresponds to a acute infarct seen on the previous MR examination.  No developing mass effect or midline shift.  No abnormal extra-axial fluid collections.  Gray-white matter junctions  are distinct.  Basal cisterns are not effaced.  No evidence of acute intracranial hemorrhage.  Postoperative changes with basilar occipital craniectomy  IMPRESSION: No acute intracranial abnormalities demonstrated.  Chronic atrophy and small vessel ischemic changes.   Original Report Authenticated By: Burman Nieves, M.D.      1. Stroke-like symptoms       MDM  51 y/o female recently discharged from hospital with stroke returning with similar symptoms. According to her chart she is a non-compliant patient. CT scan negative. All labs unremarkable. Consulted Dr. Ashley Royalty with family practice who will evaluate patient for admission. Case discussed with Dr. Effie Shy who also evaluated patient and agrees with plan of care.         Trevor Mace, PA-C 06/15/12 0134

## 2012-06-15 ENCOUNTER — Telehealth: Payer: Self-pay | Admitting: *Deleted

## 2012-06-15 ENCOUNTER — Observation Stay (HOSPITAL_COMMUNITY): Payer: Medicare Other

## 2012-06-15 ENCOUNTER — Encounter (HOSPITAL_COMMUNITY): Payer: Self-pay | Admitting: Radiology

## 2012-06-15 ENCOUNTER — Emergency Department (HOSPITAL_COMMUNITY): Payer: Medicare Other

## 2012-06-15 DIAGNOSIS — I6350 Cerebral infarction due to unspecified occlusion or stenosis of unspecified cerebral artery: Secondary | ICD-10-CM

## 2012-06-15 LAB — GLUCOSE, CAPILLARY: Glucose-Capillary: 93 mg/dL (ref 70–99)

## 2012-06-15 LAB — RAPID URINE DRUG SCREEN, HOSP PERFORMED
Barbiturates: NOT DETECTED
Benzodiazepines: NOT DETECTED
Cocaine: NOT DETECTED
Opiates: NOT DETECTED
Tetrahydrocannabinol: NOT DETECTED

## 2012-06-15 MED ORDER — POLYSACCHARIDE IRON COMPLEX 150 MG PO CAPS
150.0000 mg | ORAL_CAPSULE | Freq: Two times a day (BID) | ORAL | Status: DC
  Administered 2012-06-15 – 2012-06-17 (×4): 150 mg via ORAL
  Filled 2012-06-15 (×6): qty 1

## 2012-06-15 MED ORDER — POLYETHYLENE GLYCOL 3350 17 G PO PACK: 17.0000 g | PACK | Freq: Two times a day (BID) | ORAL | Status: DC | PRN

## 2012-06-15 MED ORDER — HEPARIN SODIUM (PORCINE) 5000 UNIT/ML IJ SOLN
5000.0000 [IU] | Freq: Three times a day (TID) | INTRAMUSCULAR | Status: DC
  Administered 2012-06-15 – 2012-06-16 (×3): 5000 [IU] via SUBCUTANEOUS
  Filled 2012-06-15 (×7): qty 1

## 2012-06-15 MED ORDER — ASPIRIN EC 81 MG PO TBEC
81.0000 mg | DELAYED_RELEASE_TABLET | Freq: Every day | ORAL | Status: DC
  Administered 2012-06-15: 81 mg via ORAL
  Filled 2012-06-15: qty 1

## 2012-06-15 MED ORDER — SERTRALINE HCL 50 MG PO TABS
50.0000 mg | ORAL_TABLET | Freq: Every day | ORAL | Status: DC
  Administered 2012-06-15 – 2012-06-17 (×3): 50 mg via ORAL
  Filled 2012-06-15 (×3): qty 1

## 2012-06-15 MED ORDER — HYDROCHLOROTHIAZIDE 25 MG PO TABS
12.5000 mg | ORAL_TABLET | Freq: Every day | ORAL | Status: DC
  Filled 2012-06-15 (×2): qty 0.5

## 2012-06-15 MED ORDER — HYDROCHLOROTHIAZIDE 12.5 MG PO CAPS
12.5000 mg | ORAL_CAPSULE | Freq: Every day | ORAL | Status: DC
  Administered 2012-06-15: 12.5 mg via ORAL
  Filled 2012-06-15 (×2): qty 1

## 2012-06-15 MED ORDER — CLOPIDOGREL BISULFATE 75 MG PO TABS
75.0000 mg | ORAL_TABLET | Freq: Every day | ORAL | Status: DC
  Administered 2012-06-15 – 2012-06-17 (×3): 75 mg via ORAL
  Filled 2012-06-15 (×3): qty 1

## 2012-06-15 MED ORDER — TRAZODONE HCL 50 MG PO TABS
50.0000 mg | ORAL_TABLET | Freq: Every day | ORAL | Status: DC
  Administered 2012-06-15 – 2012-06-16 (×2): 50 mg via ORAL
  Filled 2012-06-15 (×4): qty 1

## 2012-06-15 MED ORDER — ALBUTEROL SULFATE HFA 108 (90 BASE) MCG/ACT IN AERS: 2.0000 | INHALATION_SPRAY | Freq: Four times a day (QID) | RESPIRATORY_TRACT | Status: DC | PRN

## 2012-06-15 MED ORDER — TRAMADOL HCL 50 MG PO TABS
50.0000 mg | ORAL_TABLET | Freq: Four times a day (QID) | ORAL | Status: DC | PRN
  Filled 2012-06-15: qty 1

## 2012-06-15 MED ORDER — ACETAMINOPHEN 325 MG PO TABS
650.0000 mg | ORAL_TABLET | Freq: Four times a day (QID) | ORAL | Status: DC | PRN
  Administered 2012-06-15 – 2012-06-17 (×2): 650 mg via ORAL
  Filled 2012-06-15 (×2): qty 2

## 2012-06-15 MED ORDER — SIMVASTATIN 40 MG PO TABS
40.0000 mg | ORAL_TABLET | Freq: Every day | ORAL | Status: DC
  Administered 2012-06-15: 40 mg via ORAL
  Filled 2012-06-15 (×2): qty 1

## 2012-06-15 MED ORDER — GABAPENTIN 300 MG PO CAPS
300.0000 mg | ORAL_CAPSULE | Freq: Every day | ORAL | Status: DC
  Administered 2012-06-15 – 2012-06-16 (×2): 300 mg via ORAL
  Filled 2012-06-15 (×3): qty 1

## 2012-06-15 NOTE — Telephone Encounter (Signed)
Left message for patient to call and schedule holter monitor.

## 2012-06-15 NOTE — H&P (Signed)
[facility] Attending Admission Note: Denny Levy MD [phone] pager office [phone] I  have seen and examined this patient, reviewed their chart. I have discussed this patient with the resident. I agree with the resident's findings, assessment and care plan. On my examination this morning, her motor strength in left leg is almost equivalent to that if the right in hip extension, knee extension, dorsiflexion and plantar flexion. Her UE grip strength is 4/5 left and 5/5 right but I am unsure of her effort. I did not ask her to stand. I agree with the MRI to see if there has been further injury to same area. She seems quite depressed as evidenced by flat affect, some psychomotor retardation. On treatment for depression. I would recommend her outpatient physician consider broadening her treatment , possibly with with addition of low dose buproprion or other. Alternatively you could increase her sertraline. I am not sure if she is actively involved in any type of therapy or bereavement counseling (as was recommended on d/c from [facility]), butt that could also be a consideration.

## 2012-06-15 NOTE — ED Provider Notes (Signed)
Lindsey [name] is a 51 y.o. female with left foot pain that started 7 hours ago. She had preceding fall 2 hours prior while walking with her walker. She left rehabilitation about 2 weeks ago. She was due to see the rehabilitation Dr. in followup, on 12/10./13, but forgot about the appointment.  Patient is alert, cooperative. She is able to sit up, and does not have ataxia. Has good strength in arms, and legs bilaterally. She has mildly diminished. Light touch, sensation in the left ankle, and left foot  Assessment. Small area of paresthesia, possibly recurrent right brain stroke.  Plan: We'll contact her PCP. Service, to decide on the appropriate treatment plan and possible admission.   Medical screening examination/treatment/procedure(s) were conducted as a shared visit with non-physician practitioner(s) and myself.  I personally evaluated the patient during the encounter  Flint Melter, MD 06/15/12 [phone]

## 2012-06-15 NOTE — ED Notes (Signed)
Patient transported to CT 

## 2012-06-15 NOTE — Progress Notes (Signed)
UR Completed Natallie Ravenscroft Graves-Bigelow, RN,BSN [phone]  

## 2012-06-15 NOTE — H&P (Signed)
Lindsey [name] is an 51 y.o. female.   Assessment/Plan 51 year old female with history of HTN, HLD, and recent stroke here with worsening L side weakness.  1. CVA:  Here with worsening symptoms of L sided weakness since her recent stroke last month.  She had a complete workup for stroke during her previous admission so will not repeat mra, carotid dopplers, echo, lipids and a1c.  She is currently on plavix and aspirin.  Will continue this regimen for now.  Patient not comfortable going home tonight due to worsening weakness.   -Admit to telemetry -Allow for permissive HTN -Plavix and asa -MRI for evaluation of new stroke -Bedside swallow screen -PT/OT evaluation  2. HTN: BP elevated will allow for permissive hypertension in setting of new stroke -Continue HCTZ, hold norvasc and metoprolol  3. HLD -Continue simvastatin  4. Chronic pain -Continue gabapentin and tramadol  5. FEN/GI:  SLIV. Replace electrolytes as needed  6. Dispo:  Pending futher evaluation and PT/OT input.     Chief Complaint: L sided weakness  HPI: 51 year old female with history of HTN, HLD, and recent stroke here with worsening L side weakness.  Patient states that she began having increase L sided weakness  last night to the point where she could not ambulate and fell.  She denies any trauma from the fall. Weakness is worse in her L leg but does endorse arm involvement. She was recently admitted a month ago and found to have new stroke.  She recently was discharged from inpatient rehab and was ambulating well with a walker at home.  She missed her follow up appointment with her rehab doctor yesterday.  She is not comfortable going home at this time because of her weakness.  She also endorses some dizziness, that was not relieved by her vertigo medication.  She denies chest pain, palpitations, headache, nausea, vomiting, vision changes or slurred speech.   Past Medical History  Diagnosis Date  . Hypertension   .  Chronic kidney disease   . Neuropathy   . Sleep apnea   . Chiari I malformation   . Hyperlipidemia   . CVA (cerebral infarction)   . GERD (gastroesophageal reflux disease)   . Migraine   . Daily headache     "lately" (05/18/12)  . Stroke 2008;  ?2009;     "twice; once    Past Surgical History  Procedure Date  . Reduction mammaplasty ~ 2002  . Cesarean section 1610; 1983; 1989  . Craniectomy suboccipital w/ cervical laminectomy / chiari 2000's  . Brain surgery     Family History  Problem Relation Age of Onset  . Hyperlipidemia Mother   . Heart disease Mother   . Hypertension Mother   . Heart disease Sister   . Heart disease Brother    Social History:  reports that she has never smoked. She has never used smokeless tobacco. She reports that she does not drink alcohol or use illicit drugs.  Allergies:  Allergies  Allergen Reactions  . Iodine Swelling    Tongue swells up per pt.     (Not in a hospital admission)  Results for orders placed during the hospital encounter of 06/14/12 (from the past 48 hour(s))  PROTIME-INR     Status: Normal   Collection Time   06/14/12 10:50 PM      Component Value Range Comment   Prothrombin Time 13.3  11.6 - 15.2 seconds    INR 1.02  0.00 - 1.49  APTT     Status: Normal   Collection Time   06/14/12 10:50 PM      Component Value Range Comment   aPTT 29  24 - 37 seconds   CBC     Status: Abnormal   Collection Time   06/14/12 10:50 PM      Component Value Range Comment   WBC 13.8 (*) 4.0 - 10.5 K/uL    RBC 4.12  3.87 - 5.11 MIL/uL    Hemoglobin 11.9 (*) 12.0 - 15.0 g/dL    HCT 16.1 (*) 09.6 - 46.0 %    MCV 80.8  78.0 - 100.0 fL    MCH 28.9  26.0 - 34.0 pg    MCHC 35.7  30.0 - 36.0 g/dL    RDW 04.5 (*) 40.9 - 15.5 %    Platelets 310  150 - 400 K/uL   DIFFERENTIAL     Status: Abnormal   Collection Time   06/14/12 10:50 PM      Component Value Range Comment   Neutrophils Relative 79 (*) 43 - 77 %    Neutro Abs 10.9 (*)  1.7 - 7.7 K/uL    Lymphocytes Relative 17  12 - 46 %    Lymphs Abs 2.3  0.7 - 4.0 K/uL    Monocytes Relative 3  3 - 12 %    Monocytes Absolute 0.5  0.1 - 1.0 K/uL    Eosinophils Relative 0  0 - 5 %    Eosinophils Absolute 0.1  0.0 - 0.7 K/uL    Basophils Relative 0  0 - 1 %    Basophils Absolute 0.0  0.0 - 0.1 K/uL   COMPREHENSIVE METABOLIC PANEL     Status: Abnormal   Collection Time   06/14/12 10:50 PM      Component Value Range Comment   Sodium 140  135 - 145 mEq/L    Potassium 4.5  3.5 - 5.1 mEq/L    Chloride 104  96 - 112 mEq/L    CO2 24  19 - 32 mEq/L    Glucose, Bld 95  70 - 99 mg/dL    BUN 25 (*) 6 - 23 mg/dL    Creatinine, Ser 8.11 (*) 0.50 - 1.10 mg/dL    Calcium 9.9  8.4 - 91.4 mg/dL    Total Protein 8.1  6.0 - 8.3 g/dL    Albumin 3.7  3.5 - 5.2 g/dL    AST 14  0 - 37 U/L    ALT 11  0 - 35 U/L    Alkaline Phosphatase 91  39 - 117 U/L    Total Bilirubin 0.2 (*) 0.3 - 1.2 mg/dL    GFR calc non Af Amer 40 (*) >90 mL/min    GFR calc Af Amer 46 (*) >90 mL/min   TROPONIN I     Status: Normal   Collection Time   06/14/12 10:51 PM      Component Value Range Comment   Troponin I <0.30  <0.30 ng/mL   URINE RAPID DRUG SCREEN (HOSP PERFORMED)     Status: Normal   Collection Time   06/14/12 11:50 PM      Component Value Range Comment   Opiates NONE DETECTED  NONE DETECTED    Cocaine NONE DETECTED  NONE DETECTED    Benzodiazepines NONE DETECTED  NONE DETECTED    Amphetamines NONE DETECTED  NONE DETECTED    Tetrahydrocannabinol NONE DETECTED  NONE DETECTED    Barbiturates NONE  DETECTED  NONE DETECTED   POCT I-STAT, CHEM 8     Status: Abnormal   Collection Time   06/14/12 11:51 PM      Component Value Range Comment   Sodium 140  135 - 145 mEq/L    Potassium 4.4  3.5 - 5.1 mEq/L    Chloride 107  96 - 112 mEq/L    BUN 27 (*) 6 - 23 mg/dL    Creatinine, Ser 1.61 (*) 0.50 - 1.10 mg/dL    Glucose, Bld 93  70 - 99 mg/dL    Calcium, Ion 0.96 (*) 1.12 - 1.23 mmol/L    TCO2  26  0 - 100 mmol/L    Hemoglobin 12.2  12.0 - 15.0 g/dL    HCT 04.5  40.9 - 81.1 %   GLUCOSE, CAPILLARY     Status: Normal   Collection Time   06/15/12 12:51 AM      Component Value Range Comment   Glucose-Capillary 92  70 - 99 mg/dL    Ct Head Wo Contrast  06/15/2012  *RADIOLOGY REPORT*  Clinical Data: Left ankle and left foot numbness.  History of stroke 2 weeks ago.  CT HEAD WITHOUT CONTRAST  Technique:  Contiguous axial images were obtained from the base of the skull through the vertex without contrast.  Comparison: MRI brain 05/18/2012.  CT head 05/18/2012.  Findings: Mild diffuse cerebral atrophy.  Low attenuation change in the deep white matter consistent with small vessel ischemia.  Old lacunar infarcts in the basal ganglia and thalami.  Increasing low attenuation change in the right thalamus likely corresponds to a acute infarct seen on the previous MR examination.  No developing mass effect or midline shift.  No abnormal extra-axial fluid collections.  Gray-white matter junctions are distinct.  Basal cisterns are not effaced.  No evidence of acute intracranial hemorrhage.  Postoperative changes with basilar occipital craniectomy  IMPRESSION: No acute intracranial abnormalities demonstrated.  Chronic atrophy and small vessel ischemic changes.   Original Report Authenticated By: Burman Nieves, M.D.     ROS Per HPI  Blood pressure 144/93, pulse 70, temperature 98.7 F (37.1 C), temperature source Oral, resp. rate 15, last menstrual period 05/11/2012, SpO2 100.00%. Physical Exam  Constitutional: She is oriented to person, place, and time.       Obese female, nad   HENT:  Head: Normocephalic and atraumatic.  Mouth/Throat: Oropharynx is clear and moist.  Eyes: EOM are normal. Pupils are equal, round, and reactive to light.  Neck: Neck supple. No thyromegaly present.  Cardiovascular: Normal rate, regular rhythm and normal heart sounds.   Respiratory: Effort normal and breath sounds  normal. No respiratory distress.  GI: Soft. Bowel sounds are normal. She exhibits no distension. There is no tenderness.  Musculoskeletal: She exhibits no edema.  Neurological: She is alert and oriented to person, place, and time. A sensory deficit is present. No cranial nerve deficit. She displays a negative Romberg sign.       Paraesthesias in L leg. Strength in L leg and L arm 4/5 R leg and arm 5/5 Mild L sided pronator drift       Kristen Fromm 06/15/2012, 1:55 AM

## 2012-06-15 NOTE — Evaluation (Signed)
Occupational Therapy Evaluation Patient Details Name: Lindsey [name] MRN: [id number] DOB: Apr 20, 1961 Today's Date: 06/15/2012 Time: 6213-0865 OT Time Calculation (min): 22 min  OT Assessment / Plan / Recommendation Clinical Impression  Pt admitted with increased LLE weakness.  Pt able to perform ADLs at overall mod I/sup level and is safe to d/c home with family.  Recommend OPOT to continue progressign rehab of LUE.    OT Assessment  All further OT needs can be met in the next venue of care    Follow Up Recommendations  Outpatient OT    Barriers to Discharge      Equipment Recommendations  None recommended by OT    Recommendations for Other Services    Frequency       Precautions / Restrictions Precautions Precautions: Fall (minimal fall risk) Precaution Comments: L hemiparesis, h/o "vertigo" per pt    Pertinent Vitals/Pain See vitals    ADL  Eating/Feeding: Performed;Independent Where Assessed - Eating/Feeding: Chair Grooming: Performed;Wash/dry hands;Wash/dry face;Teeth care;Set up Where Assessed - Grooming: Unsupported standing Upper Body Dressing: Performed;Set up Where Assessed - Upper Body Dressing: Unsupported sitting Toilet Transfer: Simulated;Supervision/safety Toilet Transfer Method: Sit to Barista: Other (comment) (chair) Equipment Used: Rolling walker Transfers/Ambulation Related to ADLs: supervision with RW in room ADL Comments: Setup assist to retrieve ADL items.  Pt appears to be at baseline.     OT Diagnosis:    OT Problem List:   OT Treatment Interventions:     OT Goals    Visit Information  Last OT Received On: 06/15/12 Assistance Needed: +1 PT/OT Co-Evaluation/Treatment: Yes    Subjective Data      Prior Functioning     Home Living Lives With: Alone Available Help at Discharge: Family Type of Home: House Home Access: Stairs to enter Secretary/administrator of Steps: 3 Entrance Stairs-Rails: Right Home  Layout: One level Bathroom Shower/Tub: Engineer, manufacturing systems: Standard Home Adaptive Equipment: Walker - rolling;Tub transfer bench Prior Function Level of Independence: Needs assistance Needs Assistance: Meal Prep;Light Housekeeping Meal Prep: Maximal Light Housekeeping: Maximal Able to Take Stairs?: Yes Driving: No Vocation: Unemployed Communication Communication: No difficulties Dominant Hand: Right         Vision/Perception     Cognition  Overall Cognitive Status: Appears within functional limits for tasks assessed/performed Arousal/Alertness: Awake/alert Orientation Level: Appears intact for tasks assessed Behavior During Session: [facility] for tasks performed    Extremity/Trunk Assessment Right Upper Extremity Assessment RUE ROM/Strength/Tone: Within functional levels Left Upper Extremity Assessment LUE ROM/Strength/Tone: Deficits LUE ROM/Strength/Tone Deficits: 4/5 throughout Right Lower Extremity Assessment RLE ROM/Strength/Tone: Within functional levels Left Lower Extremity Assessment LLE ROM/Strength/Tone: Deficits;WFL for tasks assessed LLE ROM/Strength/Tone Deficits: grossly 4/5 overall     Mobility Bed Mobility Bed Mobility: Supine to Sit;Sitting - Scoot to Edge of Bed Right Sidelying to Sit: 7: Independent Sitting - Scoot to Edge of Bed: 7: Independent Transfers Transfers: Sit to Stand;Stand to Sit Sit to Stand: 6: Modified independent (Device/Increase time);With armrests;From bed;With upper extremity assist Stand to Sit: 6: Modified independent (Device/Increase time);With upper extremity assist     Shoulder Instructions     Exercise     Balance Balance Balance Assessed: Yes Static Sitting Balance Static Sitting - Balance Support: Feet supported;No upper extremity supported Static Sitting - Level of Assistance: 7: Independent Dynamic Standing Balance Dynamic Standing - Balance Support: During functional activity;No upper extremity  supported Dynamic Standing - Level of Assistance: 5: Stand by assistance   End of Session OT -  End of Session Equipment Utilized During Treatment:  (RW) Activity Tolerance: Patient tolerated treatment well Patient left: in chair;with call bell/phone within reach;with nursing in room Nurse Communication: Mobility status  GO Functional Assessment Tool Used: clinical judgement Functional Limitation: Self care Self Care Current Status (J4782): At least 1 percent but less than 20 percent impaired, limited or restricted Self Care Goal Status (N5621): At least 1 percent but less than 20 percent impaired, limited or restricted Self Care Discharge Status [phone]): At least 1 percent but less than 20 percent impaired, limited or restricted  06/15/2012 Cipriano Mile OTR/L Pager [phone] Office [phone]  Cipriano Mile 06/15/2012, 11:13 AM

## 2012-06-15 NOTE — Care Management Note (Signed)
    Page 1 of 1   06/16/2012     2:27:47 PM   CARE MANAGEMENT NOTE 06/16/2012  Patient:  Lindsey [name], Lindsey [name]   Account Number:  [id number]  Date Initiated:  06/15/2012  Documentation initiated by:  GRAVES-BIGELOW,Hobson Lax  Subjective/Objective Assessment:   Pt admitted with worsening symptoms of L sided weakness since her recent stroke last month.     Action/Plan:   Pt was recently d/c from CIR and was at home. CM will evaluate in am for [facility] needs.   Anticipated DC Date:  06/16/2012   Anticipated DC Plan:  HOME W HOME HEALTH SERVICES      DC Planning Services  CM consult      Choice offered to / List presented to:             Status of service:  Completed, signed off Medicare Important Message given?   (If response is "NO", the following Medicare IM given date fields will be blank) Date Medicare IM given:   Date Additional Medicare IM given:    Discharge Disposition:  HOME/SELF CARE  Per UR Regulation:  Reviewed for med. necessity/level of care/duration of stay  If discussed at Long Length of Stay Meetings, dates discussed:    Comments:  06-16-12 [address] [place], [place] [phone] CM did calrify orders for services and MD stated for outpatient PT/OT services. CM spoke to pt and she was scheduled to go to [address] for therapy. CM will fax inofrmation to Outpt Rehab for services at d/c. Will continue to f/u.

## 2012-06-15 NOTE — Evaluation (Signed)
Physical Therapy Evaluation Patient Details Name: Lindsey [name] MRN: [id number] DOB: 05/26/1961 Today's Date: 06/15/2012 Time: 4098-1191 PT Time Calculation (min): 22 min  PT Assessment / Plan / Recommendation Clinical Impression  pt admitted with report of incr. L LE weakness.  She reports the leg is still weaker, but pt is at or close to a modified independent level and will be safe at home with her daughter's assist.  Rec. OPPT after D/C.  Will sign off at this time.    PT Assessment  All further PT needs can be met in the next venue of care    Follow Up Recommendations  Outpatient PT    Does the patient have the potential to tolerate intense rehabilitation      Barriers to Discharge        Equipment Recommendations  None recommended by PT    Recommendations for Other Services     Frequency      Precautions / Restrictions Precautions Precautions: Fall (minimal fall risk) Precaution Comments: L hemiparesis, h/o "vertigo" per pt    Pertinent Vitals/Pain       Mobility  Bed Mobility Bed Mobility: Supine to Sit;Sitting - Scoot to Edge of Bed Right Sidelying to Sit: 7: Independent Sitting - Scoot to Edge of Bed: 7: Independent Transfers Transfers: Sit to Stand;Stand to Sit Sit to Stand: 6: Modified independent (Device/Increase time);With armrests;From bed;With upper extremity assist Stand to Sit: 6: Modified independent (Device/Increase time);With upper extremity assist Ambulation/Gait Ambulation/Gait Assistance: 5: Supervision Ambulation Distance (Feet): 25 Feet Assistive device: Rolling walker Ambulation/Gait Assistance Details: safe but slow and deliberate Gait Pattern: Step-through pattern;Decreased step length - right;Decreased step length - left;Decreased stride length    Shoulder Instructions     Exercises     PT Diagnosis:    PT Problem List: Decreased strength;Decreased coordination PT Treatment Interventions:     PT Goals    Visit  Information  Last PT Received On: 06/15/12 Assistance Needed: +1    Subjective Data  Subjective: I could still do most of the things I needed to do at home Patient Stated Goal: Independent   Prior Functioning  Home Living Lives With: Alone Available Help at Discharge: Family Type of Home: House Home Access: Stairs to enter Secretary/administrator of Steps: 3 Entrance Stairs-Rails: Right Home Layout: One level Bathroom Shower/Tub: Engineer, manufacturing systems: Standard Home Adaptive Equipment: Walker - rolling;Tub transfer bench Prior Function Level of Independence: Needs assistance Needs Assistance: Meal Prep;Light Housekeeping Meal Prep: Maximal Light Housekeeping: Maximal Able to Take Stairs?: Yes Driving: No Vocation: Unemployed Communication Communication: No difficulties Dominant Hand: Right    Cognition  Overall Cognitive Status: Appears within functional limits for tasks assessed/performed Arousal/Alertness: Awake/alert Orientation Level: Appears intact for tasks assessed Behavior During Session: [facility] for tasks performed    Extremity/Trunk Assessment Right Lower Extremity Assessment RLE ROM/Strength/Tone: Within functional levels Left Lower Extremity Assessment LLE ROM/Strength/Tone: Deficits;WFL for tasks assessed LLE ROM/Strength/Tone Deficits: grossly 4/5 overall   Balance Balance Balance Assessed: Yes Static Sitting Balance Static Sitting - Balance Support: Feet supported;No upper extremity supported Static Sitting - Level of Assistance: 7: Independent Dynamic Standing Balance Dynamic Standing - Balance Support: During functional activity;No upper extremity supported Dynamic Standing - Level of Assistance: 5: Stand by assistance  End of Session PT - End of Session Activity Tolerance: Patient tolerated treatment well Patient left: in chair;with call bell/phone within reach Nurse Communication: Mobility status  GP     Janell Keeling, Eliseo Gum 06/15/2012, 11:04  AM  06/15/2012  [place] Bing, PT [phone] (pager)

## 2012-06-16 ENCOUNTER — Encounter (HOSPITAL_COMMUNITY): Payer: Self-pay | Admitting: Radiology

## 2012-06-16 MED ORDER — METOPROLOL SUCCINATE ER 25 MG PO TB24
25.0000 mg | ORAL_TABLET | Freq: Every day | ORAL | Status: DC
  Administered 2012-06-17: 25 mg via ORAL
  Filled 2012-06-16 (×2): qty 1

## 2012-06-16 MED ORDER — PREDNISONE 50 MG PO TABS
50.0000 mg | ORAL_TABLET | Freq: Four times a day (QID) | ORAL | Status: AC
  Administered 2012-06-16 – 2012-06-17 (×3): 50 mg via ORAL
  Filled 2012-06-16 (×5): qty 1

## 2012-06-16 MED ORDER — DIPHENHYDRAMINE HCL 50 MG PO CAPS
50.0000 mg | ORAL_CAPSULE | Freq: Once | ORAL | Status: AC
  Administered 2012-06-17: 50 mg via ORAL
  Filled 2012-06-16: qty 1

## 2012-06-16 MED ORDER — ATORVASTATIN CALCIUM 40 MG PO TABS
40.0000 mg | ORAL_TABLET | Freq: Every day | ORAL | Status: DC
  Administered 2012-06-16: 40 mg via ORAL
  Filled 2012-06-16 (×2): qty 1

## 2012-06-16 MED ORDER — HYDROCHLOROTHIAZIDE 25 MG PO TABS
25.0000 mg | ORAL_TABLET | Freq: Every day | ORAL | Status: DC
  Administered 2012-06-16 – 2012-06-17 (×2): 25 mg via ORAL
  Filled 2012-06-16 (×2): qty 1

## 2012-06-16 MED ORDER — HEPARIN SODIUM (PORCINE) 5000 UNIT/ML IJ SOLN
5000.0000 [IU] | Freq: Three times a day (TID) | INTRAMUSCULAR | Status: AC
  Administered 2012-06-16 (×2): 5000 [IU] via SUBCUTANEOUS
  Filled 2012-06-16 (×2): qty 1

## 2012-06-16 MED ORDER — ASPIRIN 81 MG PO CHEW
81.0000 mg | CHEWABLE_TABLET | Freq: Every day | ORAL | Status: DC
  Administered 2012-06-16 – 2012-06-17 (×2): 81 mg via ORAL
  Filled 2012-06-16 (×2): qty 1

## 2012-06-16 MED ORDER — SODIUM CHLORIDE 0.9 % IV SOLN
INTRAVENOUS | Status: DC
  Administered 2012-06-16 – 2012-06-17 (×2): via INTRAVENOUS

## 2012-06-16 MED ORDER — HEPARIN SODIUM (PORCINE) 5000 UNIT/ML IJ SOLN
5000.0000 [IU] | Freq: Three times a day (TID) | INTRAMUSCULAR | Status: DC
  Filled 2012-06-16: qty 1

## 2012-06-16 NOTE — Progress Notes (Signed)
   Late Entry--PT Evaluation Addendum   2012-06-27 0950  PT G-Codes **NOT FOR INPATIENT CLASS**  Functional Assessment Tool Used clinical judgement  Functional Limitation Mobility: Walking and moving around  Mobility: Walking and Moving Around Current Status [id number]) CI  Mobility: Walking and Moving Around Goal Status [phone]) CI  Mobility: Walking and Moving Around Discharge Status (U9811) CI  PT General Charges  $$ ACUTE PT VISIT 1 Procedure  PT Evaluation  $Initial PT Evaluation Tier I 1 Procedure  PT Treatments  $Gait Training 8-22 mins   06-27-12  Paul Bing, PT [phone] (pager)

## 2012-06-16 NOTE — Progress Notes (Signed)
Family Medicine Teaching Service Daily Progress Note Service Page: [phone]  Patient Assessment: 51 year old female with history of HTN, HLD, and recent stroke here with worsening L side weakness.   Subjective: Pt still has some weakness this morning. I gave her the results of her MRI & MRA, and told her we would be speaking with the radiologists about whether intervention could be done on her aneurysm.   Objective: Temp:  [97.6 F (36.4 C)-98.9 F (37.2 C)] 98.2 F (36.8 C) (12/12 0800) Pulse Rate:  [66-76] 66  (12/12 0800) Resp:  [17-18] 18  (12/12 0800) BP: (129-172)/(79-103) 157/96 mmHg (12/12 0800) SpO2:  [93 %-100 %] 97 % (12/12 0800) Weight:  [191 lb 8 oz (86.864 kg)] 191 lb 8 oz (86.864 kg) (12/12 0400) Exam: General: NAD HEENT: normocephalic Cardiovascular: RRR Respiratory: NWOB Neuro: speech intact, face symmetrical, 5/5 strength in B/L lower extremities, 4/5 left grip strength, 5/5 right grip strength  I have reviewed the patient's medications, labs, imaging, and diagnostic testing.  Notable results are summarized below.  Medications:  Scheduled Meds: Plavix 75mg  daily Gabapentin 300mg  QHS Heparin 5000u TID HCTZ 25mg  PO daily Iron polysaccharides 150mg  BID Metoprolol-XL 25mg  daily Zoloft 50mg  daily Simvastatin 40mg  QHS Trazodone 50mg  QHS  PRN Meds: Tylenol 650mg  PO q6h prn Albuterol 2 puff q6h prn Miralax 17g BID prn Tramadol 50mg  q6h prn  IVF: none  Labs:  No new labs today  Imaging/Diagnostic Tests:  MRI Brain 06/06/12: -Acute small infarct anterior aspect of the left lenticular nucleus. Remote infarcts seen immediately posterior to this region.  -Acute tiny non hemorrhagic infarct lateral border of the right hippocampus.  -Subacute non hemorrhagic infarct at the junction of the right thalamus and posterior limb of the right internal capsule.  -Several remote infarcts involving the basal ganglia bilaterally, corona radiata and centrum semiovale  bilaterally. Remote inferior left cerebellar infarcts. Prominent small vessel disease type changes.  -Air-fluid level left maxillary sinus raise possibility acute sinusitis.  MRA Brain 06/15/12: -Significant intracranial atherosclerotic type changes have progressed since prior examination.  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  Assessment/Plan: 51 year old female with history of HTN, HLD, and recent stroke here with worsening L side weakness.   # CVA: Here with worsening symptoms of L sided weakness since her recent stroke last month. MRI showing two acute infarcts and one subacute infarct. 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 -Allowing for permissive HTN in setting of acute stroke -Plavix and aspirin (d/c'd aspirin yesterday, but will add back on given large vessel atherosclerotic disease in cerebral vasculature) -PT/OT have evaluated patient and recommend outpatient PT/OT -Spoke with Dr. Corliss Skains of Interventional Radiology today, who recommends catheter angiogram to evaluate basilar aneurysm and will plan to do this tomorrow.  -Pt has history of iodine contrast allergy (tongue swelling), so per Dr. Corliss Skains, will be pre-medicated with IV steroids to prevent reaction.  # HTN: BP elevated will allow for permissive hypertension in setting of new stroke  -Continue HCTZ 25mg  (twice home dose) and metoprolol-XL 25mg  daily (half of home dose) -holding home amlodipine  # HLD  -Continue simvastatin.  -Will need to switch this prior to d/c as patient is also on amlodipine at home, and simvastatin 40mg  and above can cross-react with amlodipine.  # Chronic pain  -Continue gabapentin and tramadol   # FEN/GI:  -SLIV  # Dispo: Pending futher evaluation and PT/OT input.  Chrisandra Netters, MD Family Medicine PGY-1

## 2012-06-16 NOTE — H&P (Signed)
Lindsey [name] is an 51 y.o. female.   Chief Complaint: CVA 2 weeks ago Recent worsening left foot and leg weakness and numbness MRI/MRA shows infarct and poss basilar artery aneurysm Scheduled for cerebral arteriogram in am Allergic to dye: premedicated HPI: HTN; CKD; HLD; CVA; GERD  Past Medical History  Diagnosis Date  . Hypertension   . Chronic kidney disease   . Neuropathy   . Sleep apnea   . Chiari I malformation   . Hyperlipidemia   . CVA (cerebral infarction)   . Migraine   . Daily headache     "lately" (05/18/12)  . Stroke 2008;  ?2009;     "twice; once  . GERD (gastroesophageal reflux disease)     Past Surgical History  Procedure Date  . Reduction mammaplasty ~ 2002  . Cesarean section 1610; 1983; 1989  . Craniectomy suboccipital w/ cervical laminectomy / chiari 2000's  . Brain surgery     Family History  Problem Relation Age of Onset  . Hyperlipidemia Mother   . Heart disease Mother   . Hypertension Mother   . Heart disease Sister   . Heart disease Brother    Social History:  reports that she has never smoked. She has never used smokeless tobacco. She reports that she does not drink alcohol or use illicit drugs.  Allergies:  Allergies  Allergen Reactions  . Iodine Swelling    Tongue swells up per pt.    Medications Prior to Admission  Medication Sig Dispense Refill  . albuterol (PROVENTIL HFA;VENTOLIN HFA) 108 (90 BASE) MCG/ACT inhaler Inhale 2 puffs into the lungs every 6 (six) hours as needed. For shortness of breath  1 each  5  . amLODipine (NORVASC) 10 MG tablet Take 1 tablet (10 mg total) by mouth daily.  30 tablet  5  . clopidogrel (PLAVIX) 75 MG tablet Take 1 tablet (75 mg total) by mouth daily with breakfast. New blood thinner  30 tablet  1  . gabapentin (NEURONTIN) 100 MG capsule Take 3 capsules (300 mg total) by mouth at bedtime.  90 capsule  5  . hydrochlorothiazide (HYDRODIURIL) 25 MG tablet Take 0.5 tablets (12.5 mg total) by mouth  daily.      . iron polysaccharides (NIFEREX) 150 MG capsule Take 1 capsule (150 mg total) by mouth 2 (two) times daily. For anemia  60 capsule  1  . metoprolol succinate (TOPROL-XL) 50 MG 24 hr tablet Take 1 tablet (50 mg total) by mouth every morning.  30 tablet  5  . polyethylene glycol (MIRALAX / GLYCOLAX) packet Take 17 g by mouth 2 (two) times daily.  14 each    . sertraline (ZOLOFT) 50 MG tablet Take 1 tablet (50 mg total) by mouth daily.  30 tablet  5  . simvastatin (ZOCOR) 40 MG tablet Take 1 tablet (40 mg total) by mouth at bedtime.  30 tablet  5  . traMADol (ULTRAM) 50 MG tablet Take 1 tablet (50 mg total) by mouth every 6 (six) hours as needed. For pain  30 tablet  5  . traZODone (DESYREL) 50 MG tablet Take 1 tablet (50 mg total) by mouth at bedtime.  30 tablet  5  . [DISCONTINUED] aspirin EC 81 MG tablet Take 1 tablet (81 mg total) by mouth daily.        Results for orders placed during the hospital encounter of 06/14/12 (from the past 48 hour(s))  PROTIME-INR     Status: Normal   Collection Time  06/14/12 10:50 PM      Component Value Range Comment   Prothrombin Time 13.3  11.6 - 15.2 seconds    INR 1.02  0.00 - 1.49   APTT     Status: Normal   Collection Time   06/14/12 10:50 PM      Component Value Range Comment   aPTT 29  24 - 37 seconds   CBC     Status: Abnormal   Collection Time   06/14/12 10:50 PM      Component Value Range Comment   WBC 13.8 (*) 4.0 - 10.5 K/uL    RBC 4.12  3.87 - 5.11 MIL/uL    Hemoglobin 11.9 (*) 12.0 - 15.0 g/dL    HCT 16.1 (*) 09.6 - 46.0 %    MCV 80.8  78.0 - 100.0 fL    MCH 28.9  26.0 - 34.0 pg    MCHC 35.7  30.0 - 36.0 g/dL    RDW 04.5 (*) 40.9 - 15.5 %    Platelets 310  150 - 400 K/uL   DIFFERENTIAL     Status: Abnormal   Collection Time   06/14/12 10:50 PM      Component Value Range Comment   Neutrophils Relative 79 (*) 43 - 77 %    Neutro Abs 10.9 (*) 1.7 - 7.7 K/uL    Lymphocytes Relative 17  12 - 46 %    Lymphs Abs 2.3  0.7  - 4.0 K/uL    Monocytes Relative 3  3 - 12 %    Monocytes Absolute 0.5  0.1 - 1.0 K/uL    Eosinophils Relative 0  0 - 5 %    Eosinophils Absolute 0.1  0.0 - 0.7 K/uL    Basophils Relative 0  0 - 1 %    Basophils Absolute 0.0  0.0 - 0.1 K/uL   COMPREHENSIVE METABOLIC PANEL     Status: Abnormal   Collection Time   06/14/12 10:50 PM      Component Value Range Comment   Sodium 140  135 - 145 mEq/L    Potassium 4.5  3.5 - 5.1 mEq/L    Chloride 104  96 - 112 mEq/L    CO2 24  19 - 32 mEq/L    Glucose, Bld 95  70 - 99 mg/dL    BUN 25 (*) 6 - 23 mg/dL    Creatinine, Ser 8.11 (*) 0.50 - 1.10 mg/dL    Calcium 9.9  8.4 - 91.4 mg/dL    Total Protein 8.1  6.0 - 8.3 g/dL    Albumin 3.7  3.5 - 5.2 g/dL    AST 14  0 - 37 U/L    ALT 11  0 - 35 U/L    Alkaline Phosphatase 91  39 - 117 U/L    Total Bilirubin 0.2 (*) 0.3 - 1.2 mg/dL    GFR calc non Af Amer 40 (*) >90 mL/min    GFR calc Af Amer 46 (*) >90 mL/min   TROPONIN I     Status: Normal   Collection Time   06/14/12 10:51 PM      Component Value Range Comment   Troponin I <0.30  <0.30 ng/mL   URINE RAPID DRUG SCREEN (HOSP PERFORMED)     Status: Normal   Collection Time   06/14/12 11:50 PM      Component Value Range Comment   Opiates NONE DETECTED  NONE DETECTED    Cocaine NONE DETECTED  NONE  DETECTED    Benzodiazepines NONE DETECTED  NONE DETECTED    Amphetamines NONE DETECTED  NONE DETECTED    Tetrahydrocannabinol NONE DETECTED  NONE DETECTED    Barbiturates NONE DETECTED  NONE DETECTED   POCT I-STAT, CHEM 8     Status: Abnormal   Collection Time   06/14/12 11:51 PM      Component Value Range Comment   Sodium 140  135 - 145 mEq/L    Potassium 4.4  3.5 - 5.1 mEq/L    Chloride 107  96 - 112 mEq/L    BUN 27 (*) 6 - 23 mg/dL    Creatinine, Ser 4.09 (*) 0.50 - 1.10 mg/dL    Glucose, Bld 93  70 - 99 mg/dL    Calcium, Ion 8.11 (*) 1.12 - 1.23 mmol/L    TCO2 26  0 - 100 mmol/L    Hemoglobin 12.2  12.0 - 15.0 g/dL    HCT 91.4  78.2 -  95.6 %   GLUCOSE, CAPILLARY     Status: Normal   Collection Time   06/15/12 12:51 AM      Component Value Range Comment   Glucose-Capillary 92  70 - 99 mg/dL   GLUCOSE, CAPILLARY     Status: Normal   Collection Time   06/15/12 12:16 PM      Component Value Range Comment   Glucose-Capillary 93  70 - 99 mg/dL    Comment 1 Notify RN      Ct Head Wo Contrast  06/15/2012  *RADIOLOGY REPORT*  Clinical Data: Left ankle and left foot numbness.  History of stroke 2 weeks ago.  CT HEAD WITHOUT CONTRAST  Technique:  Contiguous axial images were obtained from the base of the skull through the vertex without contrast.  Comparison: MRI brain 05/18/2012.  CT head 05/18/2012.  Findings: Mild diffuse cerebral atrophy.  Low attenuation change in the deep white matter consistent with small vessel ischemia.  Old lacunar infarcts in the basal ganglia and thalami.  Increasing low attenuation change in the right thalamus likely corresponds to a acute infarct seen on the previous MR examination.  No developing mass effect or midline shift.  No abnormal extra-axial fluid collections.  Gray-white matter junctions are distinct.  Basal cisterns are not effaced.  No evidence of acute intracranial hemorrhage.  Postoperative changes with basilar occipital craniectomy  IMPRESSION: No acute intracranial abnormalities demonstrated.  Chronic atrophy and small vessel ischemic changes.   Original Report Authenticated By: Burman Nieves, M.D.    Mr [facility] Wo Contrast  06/15/2012  *RADIOLOGY REPORT*  Clinical Data:  Left ankle and foot numbness.  History prior stroke.  Hypertensive hyperlipidemic patient with chronic renal disease.  Migraine headaches.  Chiari one malformation treated with suboccipital craniotomy.  MRI BRAIN WITHOUT CONTRAST MRA HEAD WITHOUT CONTRAST  Technique: Multiplanar, multiecho pulse sequences of the brain and surrounding structures were obtained according to standard protocol without intravenous contrast.   Angiographic images of the head were obtained using MRA technique without contrast.  Comparison: 06/15/2012 CT.  05/18/2012 MR.  04/27/2011 MR brain and MRA circle Willis.  MRI HEAD  Findings:  Acute small infarct anterior aspect of the left lenticular nucleus.  Remote infarcts seen immediately posterior to this region.  Acute tiny non hemorrhagic infarct lateral border of the right hippocampus.  Subacute non hemorrhagic infarct at the junction of the right thalamus and posterior limb of the right internal capsule.  Several remote infarcts involving the basal ganglia bilaterally, corona  radiata and centrum semiovale bilaterally. Remote inferior left cerebellar infarcts.  Prominent small vessel disease type changes.  Prior suboccipital craniectomy for history of Chiari malformation.  No intracranial hemorrhage.  No intracranial mass lesion detected on this unenhanced exam.  Exophthalmos.  Decreased signal intensity of bone marrow may be related to patient's habitus.  Anemia not excluded.  Appearance without change.  Air-fluid level left maxillary sinus raise possibility acute sinusitis.  Minimal mucosal thickening ethmoid sinus air cells and inferior aspect of the mastoid air cells.  IMPRESSION:  Acute small infarct anterior aspect of the left lenticular nucleus.  Remote infarcts seen immediately posterior to this region.  Acute tiny non hemorrhagic infarct lateral border of the right hippocampus.  Subacute non hemorrhagic infarct at the junction of the right thalamus and posterior limb of the right internal capsule.  Several remote infarcts involving the basal ganglia bilaterally, corona radiata and centrum semiovale bilaterally. Remote inferior left cerebellar infarcts.  Prominent small vessel disease type changes.  Air-fluid level left maxillary sinus raise possibility acute sinusitis.  MRA HEAD  Findings: Irregularity with areas of narrowing mild ectasia involving the cavernous and supraclinoid segment of the  internal carotid artery bilaterally.  Moderate to marked narrowing of the right middle cerebral artery bifurcation/proximal branches.  Mild narrowing and irregularity M1 segment left middle cerebral artery.  Moderate focal stenosis A1 segment left anterior cerebral artery.  High-grade focal stenosis A2 segment left anterior cerebral artery.  Moderate irregularity of the middle cerebral artery branches bilaterally.  Poor delineation of the right vertebral artery which although congenitally small also has superimposed significant atherosclerotic type changes.  Ectatic left vertebral artery.  Nonvisualization PICAs.  Mild irregularity and mild narrowing basilar artery.  Question tiny aneurysm of the basilar tip.  This is not appreciated on the prior examination.  Marked tandem stenosis involving portions of the posterior cerebral arteries more notable on the right.  Significant irregularity and narrowing of the superior cerebellar artery bilaterally.  IMPRESSION: Significant intracranial atherosclerotic type changes have progressed since prior examination.  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  This has been made a PRA call report utilizing dashboard call feature.   Original Report Authenticated By: Lacy Duverney, M.D.    Mr Brain Wo Contrast  06/15/2012  *RADIOLOGY REPORT*  Clinical Data:  Left ankle and foot numbness.  History prior stroke.  Hypertensive hyperlipidemic patient with chronic renal disease.  Migraine headaches.  Chiari one malformation treated with suboccipital craniotomy.  MRI BRAIN WITHOUT CONTRAST MRA HEAD WITHOUT CONTRAST  Technique: Multiplanar, multiecho pulse sequences of the brain and surrounding structures were obtained according to standard protocol without intravenous contrast.  Angiographic images of the head were obtained using MRA technique without contrast.  Comparison: 06/15/2012 CT.  05/18/2012 MR.  04/27/2011 MR brain and MRA circle Willis.  MRI HEAD  Findings:  Acute  small infarct anterior aspect of the left lenticular nucleus.  Remote infarcts seen immediately posterior to this region.  Acute tiny non hemorrhagic infarct lateral border of the right hippocampus.  Subacute non hemorrhagic infarct at the junction of the right thalamus and posterior limb of the right internal capsule.  Several remote infarcts involving the basal ganglia bilaterally, corona radiata and centrum semiovale bilaterally. Remote inferior left cerebellar infarcts.  Prominent small vessel disease type changes.  Prior suboccipital craniectomy for history of Chiari malformation.  No intracranial hemorrhage.  No intracranial mass lesion detected on this unenhanced exam.  Exophthalmos.  Decreased signal intensity of  bone marrow may be related to patient's habitus.  Anemia not excluded.  Appearance without change.  Air-fluid level left maxillary sinus raise possibility acute sinusitis.  Minimal mucosal thickening ethmoid sinus air cells and inferior aspect of the mastoid air cells.  IMPRESSION:  Acute small infarct anterior aspect of the left lenticular nucleus.  Remote infarcts seen immediately posterior to this region.  Acute tiny non hemorrhagic infarct lateral border of the right hippocampus.  Subacute non hemorrhagic infarct at the junction of the right thalamus and posterior limb of the right internal capsule.  Several remote infarcts involving the basal ganglia bilaterally, corona radiata and centrum semiovale bilaterally. Remote inferior left cerebellar infarcts.  Prominent small vessel disease type changes.  Air-fluid level left maxillary sinus raise possibility acute sinusitis.  MRA HEAD  Findings: Irregularity with areas of narrowing mild ectasia involving the cavernous and supraclinoid segment of the internal carotid artery bilaterally.  Moderate to marked narrowing of the right middle cerebral artery bifurcation/proximal branches.  Mild narrowing and irregularity M1 segment left middle cerebral  artery.  Moderate focal stenosis A1 segment left anterior cerebral artery.  High-grade focal stenosis A2 segment left anterior cerebral artery.  Moderate irregularity of the middle cerebral artery branches bilaterally.  Poor delineation of the right vertebral artery which although congenitally small also has superimposed significant atherosclerotic type changes.  Ectatic left vertebral artery.  Nonvisualization PICAs.  Mild irregularity and mild narrowing basilar artery.  Question tiny aneurysm of the basilar tip.  This is not appreciated on the prior examination.  Marked tandem stenosis involving portions of the posterior cerebral arteries more notable on the right.  Significant irregularity and narrowing of the superior cerebellar artery bilaterally.  IMPRESSION: Significant intracranial atherosclerotic type changes have progressed since prior examination.  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  This has been made a PRA call report utilizing dashboard call feature.   Original Report Authenticated By: Lacy Duverney, M.D.     Review of Systems  Constitutional: Negative for fever and weight loss.  Respiratory: Negative for shortness of breath.   Cardiovascular: Negative for chest pain.  Gastrointestinal: Negative for nausea, vomiting and abdominal pain.  Neurological: Positive for focal weakness, weakness and headaches.       Weakness and numbness Lt leg and foot; partially resolved    Blood pressure 168/101, pulse 76, temperature 97.5 F (36.4 C), temperature source Oral, resp. rate 18, height 4\' 11"  (1.499 m), weight 191 lb 8 oz (86.864 kg), last menstrual period 05/11/2012, SpO2 97.00%. Physical Exam  Constitutional: She is oriented to person, place, and time. She appears well-developed and well-nourished.  Cardiovascular: Normal rate, regular rhythm and normal heart sounds.   No murmur heard. Respiratory: Effort normal and breath sounds normal. She has no wheezes.  GI: Soft. Bowel  sounds are normal. There is no tenderness.  Musculoskeletal: Normal range of motion.  Neurological: She is alert and oriented to person, place, and time.  Skin: Skin is warm and dry.  Psychiatric: She has a normal mood and affect. Her behavior is normal. Judgment and thought content normal.     Assessment/Plan CVA Worsening lt lower extr sxs; weakness; numbness Abn MRA/MRI: poss basilar artery aneurysm Scheduled for cerebral arteriogram in am Pre medicated for dye allergy Pt aware of procedure benefits and risks and agreeable to proceed Consent signed and in chart  Caran Storck A 06/16/2012, 12:52 PM

## 2012-06-17 ENCOUNTER — Inpatient Hospital Stay (HOSPITAL_COMMUNITY): Payer: Medicare Other

## 2012-06-17 ENCOUNTER — Telehealth: Payer: Self-pay | Admitting: *Deleted

## 2012-06-17 ENCOUNTER — Inpatient Hospital Stay (HOSPITAL_COMMUNITY)
Admit: 2012-06-17 | Discharge: 2012-06-17 | Disposition: A | Discharge status: Home / Self Care | Payer: Medicare Other | Attending: Interventional Radiology | Admitting: Interventional Radiology

## 2012-06-17 DIAGNOSIS — I639 Cerebral infarction, unspecified: Secondary | ICD-10-CM | POA: Diagnosis present

## 2012-06-17 LAB — COMPREHENSIVE METABOLIC PANEL
AST: 11 U/L (ref 0–37)
BUN: 17 mg/dL (ref 6–23)
CO2: 24 mEq/L (ref 19–32)
Calcium: 9.3 mg/dL (ref 8.4–10.5)
Chloride: 104 mEq/L (ref 96–112)
Creatinine, Ser: 1.18 mg/dL — ABNORMAL HIGH (ref 0.50–1.10)
GFR calc Af Amer: 62 mL/min — ABNORMAL LOW (ref >90)
GFR calc non Af Amer: 53 mL/min — ABNORMAL LOW (ref >90)
Glucose, Bld: 115 mg/dL — ABNORMAL HIGH (ref 70–99)
Total Bilirubin: 0.3 mg/dL (ref 0.3–1.2)

## 2012-06-17 LAB — CBC WITH DIFFERENTIAL/PLATELET
Eosinophils Absolute: 0 10*3/uL (ref 0.0–0.7)
Hemoglobin: 11.7 g/dL — ABNORMAL LOW (ref 12.0–15.0)
Lymphs Abs: 2 10*3/uL (ref 0.7–4.0)
MCH: 28.1 pg (ref 26.0–34.0)
Monocytes Relative: 1 % — ABNORMAL LOW (ref 3–12)
Neutro Abs: 7.1 10*3/uL (ref 1.7–7.7)
Neutrophils Relative %: 78 % — ABNORMAL HIGH (ref 43–77)
Platelets: 285 10*3/uL (ref 150–400)
RBC: 4.16 MIL/uL (ref 3.87–5.11)
WBC: 9.1 10*3/uL (ref 4.0–10.5)

## 2012-06-17 LAB — PROTIME-INR
INR: 1.03 (ref 0.00–1.49)
Prothrombin Time: 13.4 seconds (ref 11.6–15.2)

## 2012-06-17 LAB — APTT: aPTT: 28 seconds (ref 24–37)

## 2012-06-17 MED ORDER — DIPHENHYDRAMINE HCL 50 MG/ML IJ SOLN
25.0000 mg | Freq: Once | INTRAMUSCULAR | Status: AC
  Administered 2012-06-17: 25 mg via INTRAVENOUS

## 2012-06-17 MED ORDER — HEPARIN SOD (PORK) LOCK FLUSH 100 UNIT/ML IV SOLN
INTRAVENOUS | Status: AC | PRN
  Administered 2012-06-17 (×2): 500 [IU] via INTRAVENOUS

## 2012-06-17 MED ORDER — HYDRALAZINE HCL 20 MG/ML IJ SOLN
INTRAMUSCULAR | Status: AC | PRN
  Administered 2012-06-17 (×4): 5 mg via INTRAVENOUS

## 2012-06-17 MED ORDER — ATORVASTATIN CALCIUM 40 MG PO TABS: 40.0000 mg | ORAL_TABLET | Freq: Every day | ORAL | Status: DC

## 2012-06-17 MED ORDER — HYDRALAZINE HCL 20 MG/ML IJ SOLN: 5.0000 mg | INTRAMUSCULAR | Status: DC | PRN

## 2012-06-17 MED ORDER — SODIUM CHLORIDE 0.9 % IV SOLN: INTRAVENOUS | Status: AC

## 2012-06-17 MED ORDER — IOHEXOL 300 MG/ML  SOLN
150.0000 mL | Freq: Once | INTRAMUSCULAR | Status: AC | PRN
  Administered 2012-06-17: 80 mL via INTRA_ARTERIAL

## 2012-06-17 MED ORDER — STROKE: EARLY STAGES OF RECOVERY BOOK
Freq: Once | Status: DC
  Filled 2012-06-17: qty 1

## 2012-06-17 MED ORDER — DIPHENHYDRAMINE HCL 50 MG/ML IJ SOLN
INTRAMUSCULAR | Status: AC
  Filled 2012-06-17: qty 1

## 2012-06-17 MED ORDER — ASPIRIN 81 MG PO CHEW: 81.0000 mg | CHEWABLE_TABLET | Freq: Every day | ORAL | Status: DC

## 2012-06-17 MED ORDER — HYDROCHLOROTHIAZIDE 25 MG PO TABS: 25.0000 mg | ORAL_TABLET | Freq: Every day | ORAL | Status: DC

## 2012-06-17 MED ORDER — FAMOTIDINE IN NACL 20-0.9 MG/50ML-% IV SOLN
20.0000 mg | Freq: Once | INTRAVENOUS | Status: AC
  Administered 2012-06-17: 20 mg via INTRAVENOUS
  Filled 2012-06-17 (×2): qty 50

## 2012-06-17 MED ORDER — METHYLPREDNISOLONE SODIUM SUCC 125 MG IJ SOLR
INTRAMUSCULAR | Status: AC
  Administered 2012-06-17: 125 mg
  Filled 2012-06-17: qty 2

## 2012-06-17 MED ORDER — METHYLPREDNISOLONE SODIUM SUCC 125 MG IJ SOLR
125.0000 mg | Freq: Once | INTRAMUSCULAR | Status: DC
  Filled 2012-06-17: qty 2

## 2012-06-17 NOTE — Procedures (Signed)
S/P 4 vessel cerebral arteriogram  RT CFA approach. Findings. 1.Occluded RT vertebral artery stenosis.Marland Kitchen 2.Mild Rt MCAsup division stenosis.

## 2012-06-17 NOTE — ED Notes (Signed)
Pt placed on c-pap by RT.

## 2012-06-17 NOTE — Discharge Summary (Signed)
Physician Discharge Summary  Patient ID: Lindsey [name] MRN: [id number] DOB: [birth year] Age: 51 y.o.  Admit date: 06/14/2012 Discharge date: 06/17/2012 Admitting Physician: Nestor Ramp, MD  PCP: Rodman Pickle, MD  Consultants:Interventional Radiology      Discharge Diagnosis: Principal Problem:  *CVA (cerebral infarction) Active Problems:  Essential hypertension, benign  CONTRAST DYE ALLERGY    Hospital Course Pt is a 51 y/o female with PMHx of HTN, HLD, CVA who presented with left sided weakness worsening concerning for TIA.    1) Acute CVA - Pt recently d/c for similar presentation and had extensive w/u including MRA, carotid dopplers, Echo, Lipids, and A1C while being d/c home on Plavix and ASA.  A repeat MRI was done to evaluate for possible worsening vs acute CVA showing two acute infarct along with one subacute infarct.  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  With the findings from her MRA, IR was consulted about further imaging or intervention.  They recommended a cerebral arteriorgram with dye.  Since pt had a dye allergy, she was pre-medicated and did not have any significant reactions.  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  During her stay, BP was monitored closely and her BP meds were stopped to allow adequate perfusion on a new CVA.  PT/OT evaluated pt and recommended outpatient PT, which she was set up with before d/c. She was continued on her ASA and Plavix due to her cerebral stenosis of the vessels.    2. HTN - BP meds held on admission.  BP stable during her stay and she was gradually restarted on her BP meds.     3. HLD - Due to patient being on Zocor 40 mg and Norvasc, she was switched to Lipitor 40 mg prior to d/c.   4. Chronic Pain - Stable, continued home meds.         Discharge PE   Filed Vitals:   06/17/12  1223  BP: 155/84  Pulse: 76  Temp:   Resp:    General: NAD  HEENT: normocephalic, atraumatic  Cardiovascular: RRR, no murmurs appreciated  Respiratory: NWOB  Neuro: no focal neurologic deficits, CN 2-12 intact      Procedures/Imaging:   Ct Head Wo Contrast  06/15/2012 IMPRESSION: No acute intracranial abnormalities demonstrated.  Chronic atrophy and small vessel ischemic changes.   Original Report Authenticated By: Burman Nieves, M.D.    Mr [facility] Wo Contrast  06/15/2012    IMPRESSION: Significant intracranial atherosclerotic type changes have progressed since prior examination.  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  This has been made a PRA call report utilizing dashboard call feature.   Original Report Authenticated By: Lacy Duverney, M.D.     Ir Angio Intra Extracran Sel Com Carotid Innominate Bilat Mod Sed  06/17/2012 IMPRESSION: 1.  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 2.  Scattered intracranial arteriosclerotic changes involving the anterior circulation bilaterally as described above. 3. Mild atherosclerotic disease involving the right carotid bulb as described. 4.  No angiographic evidence of intracranial aneurysms, arteriovenous malformation or dural AV fistula seen. 5. Venous outflow within normal limits.   Original Report Authenticated By: Julieanne Cotton, M.D.     Labs  CBC  Lab 06/17/12 0615 06/14/12 2351 06/14/12  2250  WBC 9.1 -- 13.8*  HGB 11.7* 12.2 11.9*  HCT 33.4* 36.0 33.3*  PLT 285 -- 310   BMET  Lab 06/17/12 0615 06/14/12 2351 06/14/12 2250  NA 138 140 140  K 4.1 4.4 4.5  CL 104 107 104  CO2 24 -- 24  BUN 17 27* 25*  CREATININE 1.18* 1.60* 1.51*  CALCIUM 9.3 -- 9.9  PROT 7.7 -- 8.1  BILITOT 0.3 -- 0.2*  ALKPHOS 88 -- 91  ALT 9 -- 11  AST 11 -- 14  GLUCOSE 115* 93 95      Patient condition at time of discharge/disposition: stable  Disposition-home   Follow up issues: 1. May need Holter Monitor scheduled as they could not schedule this before d/c. 2. Pt recently changed from Zocor to Lipitor  3. Scheduled for Neuro PT  Discharge follow up:  Follow-up Information    Follow up with Rodman Pickle, MD. On 06/27/2012. (at 3:00pm)    Contact information:   [address] [place] [address] [phone]         Discharge Orders    Future Appointments: Provider: Department: Dept Phone: Center:   06/27/2012 3:00 PM Amber Nydia Bouton, MD [facility] [phone] [facility]   06/30/2012 11:30 AM Erick Colace, MD [facility] [phone] None     Future Orders Please Complete By Expires   Ambulatory referral to Physical Therapy      Discharge patient          Discharge Instructions: Please refer to Patient Instructions section of EMR for full details.  Patient was counseled important signs and symptoms that should prompt return to medical care, changes in medications, dietary instructions, activity restrictions, and follow up appointments.  Significant instructions noted below:    Discharge Medications   Medication List     As of 06/17/2012  6:53 PM    START taking these medications         aspirin 81 MG chewable tablet   Chew 1 tablet (81 mg total) by mouth daily.   Replaces: aspirin EC 81 MG tablet      atorvastatin 40 MG tablet   Commonly known as: LIPITOR   Take 1 tablet (40 mg total) by mouth at bedtime.      CHANGE how you take these medications         hydrochlorothiazide 25 MG tablet   Commonly known as: HYDRODIURIL   Take 1 tablet (25 mg total) by mouth daily.   What changed: dose      CONTINUE taking these medications         albuterol 108 (90 BASE) MCG/ACT inhaler   Commonly known as: PROVENTIL HFA;VENTOLIN HFA   Inhale 2 puffs into the lungs every 6 (six) hours as  needed. For shortness of breath      amLODipine 10 MG tablet   Commonly known as: NORVASC   Take 1 tablet (10 mg total) by mouth daily.      clopidogrel 75 MG tablet   Commonly known as: PLAVIX   Take 1 tablet (75 mg total) by mouth daily with breakfast. New blood thinner      gabapentin 100 MG capsule   Commonly known as: NEURONTIN   Take 3 capsules (300 mg total) by mouth at bedtime.      iron polysaccharides 150 MG capsule   Commonly known as: NIFEREX   Take 1 capsule (150 mg total) by mouth 2 (two) times  daily. For anemia      metoprolol succinate 50 MG 24 hr tablet   Commonly known as: TOPROL-XL   Take 1 tablet (50 mg total) by mouth every morning.      polyethylene glycol packet   Commonly known as: MIRALAX / GLYCOLAX   Take 17 g by mouth 2 (two) times daily.      sertraline 50 MG tablet   Commonly known as: ZOLOFT   Take 1 tablet (50 mg total) by mouth daily.      simvastatin 40 MG tablet   Commonly known as: ZOCOR   Take 1 tablet (40 mg total) by mouth at bedtime.      traMADol 50 MG tablet   Commonly known as: ULTRAM   Take 1 tablet (50 mg total) by mouth every 6 (six) hours as needed. For pain      traZODone 50 MG tablet   Commonly known as: DESYREL   Take 1 tablet (50 mg total) by mouth at bedtime.      STOP taking these medications         aspirin EC 81 MG tablet   Replaced by: aspirin 81 MG chewable tablet          Where to get your medications    These are the prescriptions that you need to pick up. We sent them to a specific pharmacy, so you will need to go there to get them.   [id number] [place], [place] - [address] AT [facility] [address]    [address] [place] [address]    Phone: [phone]        aspirin 81 MG chewable tablet   atorvastatin 40 MG tablet   hydrochlorothiazide 25 MG tablet                Gildardo Cranker, DO of [name] 06/17/2012 6:09 PM

## 2012-06-17 NOTE — Progress Notes (Signed)
MD made aware of pt's Bp being 166/100 around 2230. Stated new order would be given for prn hydralazine for bp >180/110. Will continue to monitor the pt. Sanda Linger

## 2012-06-17 NOTE — Progress Notes (Signed)
Pt discharged to home per MD order. Pt alert and oriented at discharge with no complaints of pain. Pt received and reviewed all discharge instructions and medication information including follow-up appointments and prescriptions.  Pt also reviewed and received groin site care instructions prior to discharge, groin site level 0 at discharge. Pt instructed to follow-up with outpatient physical therapy if she does not receive a phone call on Monday.  Pt  Information (d/c summary, pt note and order for PT) faxed to [phone] at [facility] per case management request at discharge.  Pt escorted to private vehicle via wheelchair by nurse tech.  Efraim Kaufmann

## 2012-06-17 NOTE — Progress Notes (Signed)
Family Medicine Teaching Service Daily Progress Note Service Page: [phone]  Patient Assessment: 51 year old female with history of HTN, HLD, and recent stroke here with worsening L side weakness.   Subjective: No acute overnight events. Went for IR procedure today without complications.  Ready to go home.   Objective: Temp:  [97.3 F (36.3 C)-98.3 F (36.8 C)] 98.3 F (36.8 C) (12/13 1040) Pulse Rate:  [61-82] 76  (12/13 1223) Resp:  [15-18] 16  (12/13 1040) BP: (145-183)/(70-116) 155/84 mmHg (12/13 1223) SpO2:  [94 %-100 %] 100 % (12/13 1223) Weight:  [193 lb 8 oz (87.771 kg)] 193 lb 8 oz (87.771 kg) (12/13 0400) Exam: General: NAD  HEENT: normocephalic, atraumatic  Cardiovascular: RRR, no murmurs appreciated  Respiratory: NWOB  Neuro: no focal neurologic deficits   I have reviewed the patient's medications, labs, imaging, and diagnostic testing.  Notable results are summarized below.  Medications:  Scheduled Meds: Plavix 75mg  daily Gabapentin 300mg  QHS Heparin 5000u TID HCTZ 25mg  PO daily Iron polysaccharides 150mg  BID Metoprolol-XL 25mg  daily Zoloft 50mg  daily Atorvastatin 40mg  QHS Trazodone 50mg  QHS  PRN Meds: Tylenol 650mg  PO q6h prn Albuterol 2 puff q6h prn Miralax 17g BID prn Tramadol 50mg  q6h prn Hydralazine 5mg  IV q4h prn BP>180/110  IVF: none  Labs:  No new labs today  Imaging/Diagnostic Tests:  MRI Brain 06/06/12: -Acute small infarct anterior aspect of the left lenticular nucleus. Remote infarcts seen immediately posterior to this region.  -Acute tiny non hemorrhagic infarct lateral border of the right hippocampus.  -Subacute non hemorrhagic infarct at the junction of the right thalamus and posterior limb of the right internal capsule.  -Several remote infarcts involving the basal ganglia bilaterally, corona radiata and centrum semiovale bilaterally. Remote inferior left cerebellar infarcts. Prominent small vessel disease type changes.   -Air-fluid level left maxillary sinus raise possibility acute sinusitis.  MRA Brain 06/15/12: -Significant intracranial atherosclerotic type changes have progressed since prior examination.  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  Assessment/Plan: 51 year old female with history of HTN, HLD, and recent stroke here with worsening L side weakness.   # CVA: Here with worsening symptoms of L sided weakness since her recent stroke last month. MRI showing two acute infarcts and one subacute infarct. 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 -Allowing for some permissive HTN in setting of acute stroke -Plavix and aspirin (given large vessel atherosclerotic disease in cerebral vasculature) -PT/OT have evaluated patient and recommend outpatient PT/OT -Spoke with Dr. Corliss Skains of Interventional Radiology yesterday, who recommends catheter angiogram to evaluate basilar aneurysm, pt currently undergoing this procedure -Pt has history of iodine contrast allergy (tongue swelling), but has been pre-medicated with steroids. No problems today after the procedure done.   # HTN: BP elevated will allow for permissive hypertension in setting of new stroke  -Continue HCTZ 25mg  (twice home dose) and metoprolol-XL 25mg  daily (half of home dose) -holding home amlodipine -hydralazine 5mg  IV q4h prn BP>180/110  # HLD  - changed simvastatin to atorvastatin yesterday as pt on amlodipine at home, to avoid interaction between amlodipine & simvastatin.  # Chronic pain  -Continue gabapentin and tramadol   # FEN/GI:  -SLIV  # Dispo:  - pending results of and recovery after catheterized angiogram today, and if necessary, any further interventional procedures.   Levert Feinstein, MD Family Medicine PGY-1

## 2012-06-17 NOTE — ED Provider Notes (Signed)
Medical screening examination/treatment/procedure(s) were conducted as a shared visit with non-physician practitioner(s) and myself.  I personally evaluated the patient during the encounter  Flint Melter, MD 06/17/12 [phone]

## 2012-06-17 NOTE — Clinical Documentation Improvement (Signed)
GENERIC DOCUMENTATION CLARIFICATION QUERY  THIS DOCUMENT IS NOT A PERMANENT PART OF THE MEDICAL RECORD  TO RESPOND TO THE THIS QUERY, FOLLOW THE INSTRUCTIONS BELOW:  1. If needed, update documentation for the patient's encounter via the notes activity.  2. Access this query again and click edit on the In [employer].  3. After updating, or not, click F2 to complete all highlighted (required) fields concerning your review. Select "additional documentation in the medical record" OR "no additional documentation provided".  4. Click Sign note button.  5. The deficiency will fall out of your In Basket *Please let us know if you are not able to complete this workflow by phone or e-mail (listed below).  Please update your documentation within the medical record to reflect your response to this query.                                                                                        06/17/12   Dear Dr. Levert Feinstein  / Associates,  In a better effort to capture your patient's severity of illness/SOI, risk of mortality/ROM, reflect appropriate length of stay and utilization of resources, a review of the patient medical record has revealed the following indicators.   NOTED SEVERAL TIMES IN CHART "LEFT SIDED WEAKNESS". PLEASE CLARIFY IN NOTES/DC SUMMARY WITH MEDICARE FRIENDLY TERM IF APPROPRIATE.  THANK YOU.  Possible Clinical Conditions? - Left Hemiparesis  - Other Condition (please specify)   Supporting Information: - Risk Factors: Acute CVA x2 and subacute CVA - Signs & Symptoms: "worsening left sided weakness" on admit - Diagnostics: Exam: LUE and LLE: 4/5   You may use possible, probable, or suspect with inpatient documentation. possible, probable, suspected diagnoses MUST be documented at the time of discharge  Reviewed: additional documentation in the medical record by Dr. Gildardo Cranker, who edited his discharge summary to include the word "hemiparesis."  Thank You,  Beverley Fiedler RN BSL Clinical Documentation Specialist: Tele:  [phone] Health Information Management [place]

## 2012-06-17 NOTE — Progress Notes (Signed)
Family Medicine Teaching Service Attending Note  On 12-12 I interviewed and examined patient Lindsey [name] and reviewed their tests and x-rays.  I discussed with Dr. Pollie Meyer and reviewed their note for today.  I agree with their assessment and plan.

## 2012-06-17 NOTE — Telephone Encounter (Signed)
Unable to scheduler 24 hour holter. [facility] @ 9:46. Message will be sent to Amber Hairford to see if patient still needs to wear holter monitor

## 2012-06-18 NOTE — Discharge Summary (Signed)
I have reviewed this discharge summary and agree.    

## 2012-06-20 NOTE — Discharge Summary (Signed)
I have reviewed this discharge summary and agree.    

## 2012-06-20 NOTE — Discharge Summary (Signed)
Physician Discharge Summary  Patient ID: Lindsey [name] MRN: [id number] DOB: 02-07-1961 Age: 51 y.o.  Admit date: 06/17/2012 Discharge date: 06/20/2012 Admitting Physician: No admitting provider for patient encounter.  PCP: Rodman Pickle, MD  Consultants:Interventional Radiology      Discharge Diagnosis: Left Sided Hemiparesis with new onset acute CVA Essential HTN Benigng Contrast Allergy Dye    Hospital Course Pt is a 51 y/o female with PMHx of HTN, HLD, CVA who presented with left sided weakness worsening concerning for TIA.    1) Acute CVA with Left Sided Hemiparesis - Pt recently d/c for similar presentation and had extensive w/u including MRA, carotid dopplers, Echo, Lipids, and A1C while being d/c home on Plavix and ASA.  A repeat MRI was done to evaluate for possible worsening vs acute CVA showing two acute infarct along with one subacute infarct.  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  With the findings from her MRA, IR was consulted about further imaging or intervention.  They recommended a cerebral arteriorgram with dye.  Since pt had a dye allergy, she was pre-medicated and did not have any significant reactions.  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  During her stay, BP was monitored closely and her BP meds were stopped to allow adequate perfusion on a new CVA.  PT/OT evaluated pt and recommended outpatient PT, which she was set up with before d/c. She was continued on her ASA and Plavix due to her cerebral stenosis of the vessels.    2. HTN - BP meds held on admission.  BP stable during her stay and she was gradually restarted on her BP meds.     3. HLD - Due to patient being on Zocor 40 mg and Norvasc, she was switched to Lipitor 40 mg prior to d/c.   4. Chronic Pain - Stable, continued home meds.         Discharge  PE   Filed Vitals:   06/17/12 1002  BP: 145/70  Pulse: 76  Resp: 15   General: NAD  HEENT: normocephalic, atraumatic  Cardiovascular: RRR, no murmurs appreciated  Respiratory: NWOB  Neuro: no focal neurologic deficits, CN 2-12 intact      Procedures/Imaging:   Ct Head Wo Contrast  06/15/2012 IMPRESSION: No acute intracranial abnormalities demonstrated.  Chronic atrophy and small vessel ischemic changes.   Original Report Authenticated By: Burman Nieves, M.D.    Mr [facility] Wo Contrast  06/15/2012    IMPRESSION: Significant intracranial atherosclerotic type changes have progressed since prior examination.  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  This has been made a PRA call report utilizing dashboard call feature.   Original Report Authenticated By: Lacy Duverney, M.D.     Ir Angio Intra Extracran Sel Com Carotid Innominate Bilat Mod Sed  06/17/2012 IMPRESSION: 1.  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 2.  Scattered intracranial arteriosclerotic changes involving the anterior circulation bilaterally as described above. 3. Mild atherosclerotic disease involving the right carotid bulb as described. 4.  No angiographic evidence of intracranial aneurysms, arteriovenous malformation or dural AV fistula seen. 5. Venous outflow within normal limits.   Original Report Authenticated By: Julieanne Cotton, M.D.     Labs  CBC  Lab 06/17/12 0615 06/14/12 2351  06/14/12 2250  WBC 9.1 -- 13.8*  HGB 11.7* 12.2 11.9*  HCT 33.4* 36.0 33.3*  PLT 285 -- 310   BMET  Lab 06/17/12 0615 06/14/12 2351 06/14/12 2250  NA 138 140 140  K 4.1 4.4 4.5  CL 104 107 104  CO2 24 -- 24  BUN 17 27* 25*  CREATININE 1.18* 1.60* 1.51*  CALCIUM 9.3 -- 9.9  PROT 7.7 -- 8.1  BILITOT 0.3 -- 0.2*  ALKPHOS 88 -- 91  ALT 9 -- 11  AST 11 -- 14   GLUCOSE 115* 93 95     Patient condition at time of discharge/disposition: stable  Disposition-home   Follow up issues: 1. May need Holter Monitor scheduled as they could not schedule this before d/c. 2. Pt recently changed from Zocor to Lipitor  3. Scheduled for Neuro PT  Discharge follow up:       Discharge Orders    Future Appointments: Provider: Department: Dept Phone: Center:   06/27/2012 3:00 PM Amber Nydia Bouton, MD [facility] [phone] [facility]   06/30/2012 11:30 AM Erick Colace, MD [facility] [phone] None       Discharge Instructions: Please refer to Patient Instructions section of EMR for full details.  Patient was counseled important signs and symptoms that should prompt return to medical care, changes in medications, dietary instructions, activity restrictions, and follow up appointments.  Significant instructions noted below:    Discharge Medications Medication List      As of 06/17/2012 6:53 PM     START taking these medications        aspirin 81 MG chewable tablet     Chew 1 tablet (81 mg total) by mouth daily.     Replaces: aspirin EC 81 MG tablet     atorvastatin 40 MG tablet     Commonly known as: LIPITOR     Take 1 tablet (40 mg total) by mouth at bedtime.     CHANGE how you take these medications        hydrochlorothiazide 25 MG tablet     Commonly known as: HYDRODIURIL     Take 1 tablet (25 mg total) by mouth daily.     What changed: dose     CONTINUE taking these medications        albuterol 108 (90 BASE) MCG/ACT inhaler     Commonly known as: PROVENTIL HFA;VENTOLIN HFA     Inhale 2 puffs into the lungs every 6 (six) hours as needed. For shortness of breath     amLODipine 10 MG tablet     Commonly known as: NORVASC     Take 1 tablet (10 mg total) by mouth daily.     clopidogrel 75 MG tablet     Commonly known as: PLAVIX     Take 1 tablet (75 mg total) by mouth daily with  breakfast. New blood thinner     gabapentin 100 MG capsule     Commonly known as: NEURONTIN     Take 3 capsules (300 mg total) by mouth at bedtime.     iron polysaccharides 150 MG capsule     Commonly known as: NIFEREX     Take 1 capsule (150 mg total) by mouth 2 (two) times daily. For anemia     metoprolol succinate 50 MG 24 hr tablet     Commonly known as: TOPROL-XL     Take 1 tablet (50 mg total) by mouth every morning.  polyethylene glycol packet     Commonly known as: MIRALAX / GLYCOLAX     Take 17 g by mouth 2 (two) times daily.     sertraline 50 MG tablet     Commonly known as: ZOLOFT     Take 1 tablet (50 mg total) by mouth daily.     simvastatin 40 MG tablet     Commonly known as: ZOCOR     Take 1 tablet (40 mg total) by mouth at bedtime.     traMADol 50 MG tablet     Commonly known as: ULTRAM     Take 1 tablet (50 mg total) by mouth every 6 (six) hours as needed. For pain     traZODone 50 MG tablet     Commonly known as: DESYREL     Take 1 tablet (50 mg total) by mouth at bedtime.     STOP taking these medications        aspirin EC 81 MG tablet     Replaced by: aspirin 81 MG chewable tablet          Where to get your medications    These are the prescriptions that you need to pick up. We sent them to a specific pharmacy, so you will need to go there to get them.    [id number] - [place], [place] - [address] AT [facility] [address]     [address]  [place] [address] Phone: [phone]            aspirin 81 MG chewable tablet       atorvastatin 40 MG tablet       hydrochlorothiazide 25 MG tablet          Gildardo Cranker, DO of [name] 06/20/2012 9:52 AM

## 2012-06-22 ENCOUNTER — Ambulatory Visit: Payer: Medicare Other | Admitting: Physical Therapy

## 2012-06-27 ENCOUNTER — Ambulatory Visit: Payer: Medicare Other | Admitting: Family Medicine

## 2012-06-30 ENCOUNTER — Inpatient Hospital Stay: Payer: Medicare Other | Admitting: Physical Medicine & Rehabilitation

## 2012-07-06 ENCOUNTER — Observation Stay (HOSPITAL_COMMUNITY): Payer: Medicare Other

## 2012-07-06 ENCOUNTER — Emergency Department (HOSPITAL_COMMUNITY): Payer: Medicare Other

## 2012-07-06 ENCOUNTER — Inpatient Hospital Stay (HOSPITAL_COMMUNITY)
Admission: EM | Admit: 2012-07-06 | Discharge: 2012-07-12 | DRG: 069 | Disposition: A | Discharge status: Skilled Nursing Facility | Payer: Medicare Other | Attending: Emergency Medicine | Admitting: Emergency Medicine

## 2012-07-06 ENCOUNTER — Encounter (HOSPITAL_COMMUNITY): Payer: Self-pay | Admitting: *Deleted

## 2012-07-06 DIAGNOSIS — R0989 Other specified symptoms and signs involving the circulatory and respiratory systems: Secondary | ICD-10-CM

## 2012-07-06 DIAGNOSIS — R0609 Other forms of dyspnea: Secondary | ICD-10-CM

## 2012-07-06 DIAGNOSIS — I639 Cerebral infarction, unspecified: Secondary | ICD-10-CM

## 2012-07-06 DIAGNOSIS — Q054 Unspecified spina bifida with hydrocephalus: Secondary | ICD-10-CM

## 2012-07-06 DIAGNOSIS — N179 Acute kidney failure, unspecified: Secondary | ICD-10-CM

## 2012-07-06 DIAGNOSIS — IMO0002 Reserved for concepts with insufficient information to code with codable children: Secondary | ICD-10-CM

## 2012-07-06 DIAGNOSIS — Z91041 Radiographic dye allergy status: Secondary | ICD-10-CM

## 2012-07-06 DIAGNOSIS — I6350 Cerebral infarction due to unspecified occlusion or stenosis of unspecified cerebral artery: Secondary | ICD-10-CM

## 2012-07-06 DIAGNOSIS — Z6838 Body mass index (BMI) 38.0-38.9, adult: Secondary | ICD-10-CM

## 2012-07-06 DIAGNOSIS — E860 Dehydration: Secondary | ICD-10-CM

## 2012-07-06 DIAGNOSIS — K219 Gastro-esophageal reflux disease without esophagitis: Secondary | ICD-10-CM | POA: Diagnosis present

## 2012-07-06 DIAGNOSIS — M6282 Rhabdomyolysis: Secondary | ICD-10-CM

## 2012-07-06 DIAGNOSIS — I5032 Chronic diastolic (congestive) heart failure: Secondary | ICD-10-CM | POA: Diagnosis present

## 2012-07-06 DIAGNOSIS — I10 Essential (primary) hypertension: Secondary | ICD-10-CM

## 2012-07-06 DIAGNOSIS — Z01419 Encounter for gynecological examination (general) (routine) without abnormal findings: Secondary | ICD-10-CM

## 2012-07-06 DIAGNOSIS — I509 Heart failure, unspecified: Secondary | ICD-10-CM | POA: Diagnosis present

## 2012-07-06 DIAGNOSIS — G4730 Sleep apnea, unspecified: Secondary | ICD-10-CM

## 2012-07-06 DIAGNOSIS — Z9119 Patient's noncompliance with other medical treatment and regimen: Secondary | ICD-10-CM

## 2012-07-06 DIAGNOSIS — Z8673 Personal history of transient ischemic attack (TIA), and cerebral infarction without residual deficits: Secondary | ICD-10-CM

## 2012-07-06 DIAGNOSIS — R42 Dizziness and giddiness: Secondary | ICD-10-CM

## 2012-07-06 DIAGNOSIS — K319 Disease of stomach and duodenum, unspecified: Secondary | ICD-10-CM

## 2012-07-06 DIAGNOSIS — E46 Unspecified protein-calorie malnutrition: Secondary | ICD-10-CM | POA: Diagnosis present

## 2012-07-06 DIAGNOSIS — M79609 Pain in unspecified limb: Secondary | ICD-10-CM

## 2012-07-06 DIAGNOSIS — I6990 Unspecified sequelae of unspecified cerebrovascular disease: Secondary | ICD-10-CM

## 2012-07-06 DIAGNOSIS — E785 Hyperlipidemia, unspecified: Secondary | ICD-10-CM

## 2012-07-06 DIAGNOSIS — G629 Polyneuropathy, unspecified: Secondary | ICD-10-CM

## 2012-07-06 DIAGNOSIS — Z91199 Patient's noncompliance with other medical treatment and regimen due to unspecified reason: Secondary | ICD-10-CM

## 2012-07-06 DIAGNOSIS — G459 Transient cerebral ischemic attack, unspecified: Principal | ICD-10-CM | POA: Diagnosis present

## 2012-07-06 LAB — CBC WITH DIFFERENTIAL/PLATELET
Basophils Relative: 1 % (ref 0–1)
Eosinophils Absolute: 0 10*3/uL (ref 0.0–0.7)
Hemoglobin: 11.7 g/dL — ABNORMAL LOW (ref 12.0–15.0)
MCH: 28.3 pg (ref 26.0–34.0)
MCHC: 35.1 g/dL (ref 30.0–36.0)
Monocytes Relative: 8 % (ref 3–12)
Neutrophils Relative %: 70 % (ref 43–77)

## 2012-07-06 LAB — URINE MICROSCOPIC-ADD ON

## 2012-07-06 LAB — CK
Total CK: 2469 U/L — ABNORMAL HIGH (ref 7–177)
Total CK: 2356 U/L — ABNORMAL HIGH (ref 7–177)
Total CK: 1944 U/L — ABNORMAL HIGH (ref 7–177)

## 2012-07-06 LAB — BASIC METABOLIC PANEL
BUN: 20 mg/dL (ref 6–23)
Creatinine, Ser: 1.08 mg/dL (ref 0.50–1.10)
GFR calc Af Amer: 68 mL/min — ABNORMAL LOW (ref >90)
GFR calc non Af Amer: 59 mL/min — ABNORMAL LOW (ref >90)
Potassium: 3.9 mEq/L (ref 3.5–5.1)

## 2012-07-06 LAB — URINALYSIS, ROUTINE W REFLEX MICROSCOPIC
Ketones, ur: 15 mg/dL — AB
Protein, ur: >300 mg/dL — AB
Urobilinogen, UA: 1 mg/dL (ref 0.0–1.0)

## 2012-07-06 MED ORDER — ACETAMINOPHEN 650 MG RE SUPP: 650.0000 mg | Freq: Four times a day (QID) | RECTAL | Status: DC | PRN

## 2012-07-06 MED ORDER — CLOPIDOGREL BISULFATE 75 MG PO TABS
75.0000 mg | ORAL_TABLET | Freq: Every day | ORAL | Status: DC
  Administered 2012-07-07 – 2012-07-12 (×6): 75 mg via ORAL
  Filled 2012-07-06 (×7): qty 1

## 2012-07-06 MED ORDER — SODIUM CHLORIDE 0.9 % IV BOLUS (SEPSIS)
1000.0000 mL | Freq: Once | INTRAVENOUS | Status: AC
  Administered 2012-07-06: 1000 mL via INTRAVENOUS

## 2012-07-06 MED ORDER — GABAPENTIN 100 MG PO CAPS
100.0000 mg | ORAL_CAPSULE | Freq: Three times a day (TID) | ORAL | Status: DC
  Administered 2012-07-06 – 2012-07-12 (×18): 100 mg via ORAL
  Filled 2012-07-06 (×20): qty 1

## 2012-07-06 MED ORDER — SODIUM CHLORIDE 0.9 % IV SOLN
INTRAVENOUS | Status: DC
  Administered 2012-07-06: 23:00:00 via INTRAVENOUS
  Administered 2012-07-06: 150 mL/h via INTRAVENOUS
  Administered 2012-07-07 – 2012-07-08 (×3): via INTRAVENOUS

## 2012-07-06 MED ORDER — POLYETHYLENE GLYCOL 3350 17 G PO PACK
17.0000 g | PACK | Freq: Two times a day (BID) | ORAL | Status: DC
  Administered 2012-07-07 – 2012-07-08 (×3): 17 g via ORAL
  Filled 2012-07-06 (×13): qty 1

## 2012-07-06 MED ORDER — ATORVASTATIN CALCIUM 40 MG PO TABS
40.0000 mg | ORAL_TABLET | Freq: Every day | ORAL | Status: DC
  Administered 2012-07-06 – 2012-07-12 (×7): 40 mg via ORAL
  Filled 2012-07-06 (×7): qty 1

## 2012-07-06 MED ORDER — ALBUTEROL SULFATE HFA 108 (90 BASE) MCG/ACT IN AERS
2.0000 | INHALATION_SPRAY | Freq: Four times a day (QID) | RESPIRATORY_TRACT | Status: DC | PRN
  Filled 2012-07-06: qty 6.7

## 2012-07-06 MED ORDER — POLYSACCHARIDE IRON COMPLEX 150 MG PO CAPS
150.0000 mg | ORAL_CAPSULE | Freq: Two times a day (BID) | ORAL | Status: DC
  Administered 2012-07-07 – 2012-07-12 (×11): 150 mg via ORAL
  Filled 2012-07-06 (×13): qty 1

## 2012-07-06 MED ORDER — SERTRALINE HCL 50 MG PO TABS
50.0000 mg | ORAL_TABLET | Freq: Every day | ORAL | Status: DC
  Administered 2012-07-07 – 2012-07-12 (×6): 50 mg via ORAL
  Filled 2012-07-06 (×6): qty 1

## 2012-07-06 MED ORDER — ACETAMINOPHEN 325 MG PO TABS
650.0000 mg | ORAL_TABLET | Freq: Four times a day (QID) | ORAL | Status: DC | PRN
Start: 2012-07-06 — End: 2012-07-12
  Administered 2012-07-07 – 2012-07-12 (×7): 650 mg via ORAL
  Filled 2012-07-06 (×7): qty 2

## 2012-07-06 MED ORDER — TRAMADOL HCL 50 MG PO TABS
50.0000 mg | ORAL_TABLET | Freq: Four times a day (QID) | ORAL | Status: DC | PRN
  Filled 2012-07-06: qty 1

## 2012-07-06 MED ORDER — ASPIRIN EC 81 MG PO TBEC
81.0000 mg | DELAYED_RELEASE_TABLET | Freq: Every day | ORAL | Status: DC
  Administered 2012-07-07 – 2012-07-10 (×4): 81 mg via ORAL
  Filled 2012-07-06 (×5): qty 1

## 2012-07-06 MED ORDER — METOPROLOL SUCCINATE ER 25 MG PO TB24
25.0000 mg | ORAL_TABLET | Freq: Every day | ORAL | Status: DC
  Administered 2012-07-07 – 2012-07-09 (×3): 25 mg via ORAL
  Filled 2012-07-06 (×3): qty 1

## 2012-07-06 MED ORDER — TRAZODONE HCL 50 MG PO TABS
50.0000 mg | ORAL_TABLET | Freq: Every day | ORAL | Status: DC
  Administered 2012-07-07 – 2012-07-11 (×5): 50 mg via ORAL
  Filled 2012-07-06 (×7): qty 1

## 2012-07-06 NOTE — ED Notes (Signed)
Pt very tired, A&Ox4, resting comfortably in bed. No edema noted to LE, bounding bilat DP's.

## 2012-07-06 NOTE — ED Notes (Signed)
Per EMS daughter-in-law called 911 b/c could not contact pt.  Went to her house and found her on toilet.  Pt states she has been on toilet since some time yesterday.  BIL LE edema but ao x 4 per EMS.  PT states she was unable to get up d/t leg weakness.

## 2012-07-06 NOTE — ED Provider Notes (Signed)
History     CSN: [id number]  Arrival date & time 07/06/12  4098   First MD Initiated Contact with Patient 07/06/12 [phone]      Chief Complaint  Patient presents with  . Extremity Weakness    (Consider location/radiation/quality/duration/timing/severity/associated sxs/prior treatment) HPI  52 year old female with history of recurrent CVA, most recently diagnosed 3 weeks ago, history of hypertension, and history of sleep apnea presents for evaluations of lower extremity swelling. History was obtained through daughter-in-law, who was at bedside, and through EMS note. Patient was discharged home 3 weeks ago after suffering a CVA (has had 7-8 TIA over a span of 4 yrs).  Daughter sts pt has been non compliant with her medication, refusing to take meds because "it makes me sleepy" and refusing to use walker to move around.  She spend most of her time in a recliner.  She has been staying at her daughter's house until yesterday when she return to her house.  Her daughter tried calling her but unable to get in touch.  Her son came by the house and found pt sitting on the toilet.  Pt sts she has been sitting on the toilet for several hours because she does not have the strength to stand up.  Pt reports having pain to her L leg, chronic, sharp sensation to the thigh, non radiating, 7/10, worsening since sitting on toilet.  She acknowledge of having leg swelling, which she just noticed this AM.  Pt denies fever, chills, headache, cp, sob, abd pain, back pain, dysuria, numbness or tingling sensation.  Daughter mentioned pt has lost interest in performing her daily activity for the past several years.    Past Medical History  Diagnosis Date  . Hypertension   . Neuropathy   . Sleep apnea   . Chiari I malformation   . Hyperlipidemia   . CVA (cerebral infarction)   . Migraine   . Daily headache     "lately" (05/18/12)  . GERD (gastroesophageal reflux disease)   . Chronic kidney disease     not on dialysis   . Stroke 2008;  ?2009;     7 strokes, L sided deficits    Past Surgical History  Procedure Date  . Reduction mammaplasty ~ 2002  . Cesarean section 4782; 1983; 1989  . Craniectomy suboccipital w/ cervical laminectomy / chiari 2000's  . Brain surgery     Family History  Problem Relation Age of Onset  . Hyperlipidemia Mother   . Heart disease Mother   . Hypertension Mother   . Heart disease Sister   . Heart disease Brother     History  Substance Use Topics  . Smoking status: Never Smoker   . Smokeless tobacco: Never Used  . Alcohol Use: No    OB History    Grav Para Term Preterm Abortions TAB SAB Ect Mult Living                  Review of Systems  All other systems reviewed and are negative.    Allergies  Iodine  Home Medications  No current outpatient prescriptions on file.  BP 155/119  Pulse 100  Temp 98.5 F (36.9 C) (Oral)  Resp 18  SpO2 98%  LMP 05/27/2012  Physical Exam  Nursing note and vitals reviewed. Constitutional: She is oriented to person, place, and time. She appears well-developed and well-nourished. No distress.  HENT:  Head: Atraumatic.  Mouth/Throat: Oropharynx is clear and moist.  Eyes: Conjunctivae normal are  normal.  Neck: Normal range of motion. Neck supple.  Cardiovascular: Normal rate and regular rhythm.  Exam reveals no gallop and no friction rub.   No murmur heard. Pulmonary/Chest: Effort normal. No respiratory distress. She has no wheezes. She exhibits no tenderness.  Abdominal: Soft. There is no tenderness.  Musculoskeletal: She exhibits edema (1+ nonpitting edema to lower extremities bilaterally without palpable cords, erythema, or calves tenderness.  4/5 strength to lower extremities bilat.  5/5 strength to upper extremities.  ).  Neurological: She is alert and oriented to person, place, and time.  Skin: Skin is warm.  Psychiatric: She has a normal mood and affect.    ED Course  Procedures (including critical care  time)  Labs Reviewed  GLUCOSE, CAPILLARY - Abnormal; Notable for the following:    Glucose-Capillary 187 (*)     All other components within normal limits   No results found.   No diagnosis found.   Date: 07/06/2012  Rate: 101  Rhythm: sinus tachycardia  QRS Axis: normal  Intervals: normal  ST/T Wave abnormalities: normal  Conduction Disutrbances:none  Narrative Interpretation:   Old EKG Reviewed: unchanged  Verlisa, Vara  Female  08-03-1960  [id number]            Progress Notes signed by Kerrin Champagne at 07/06/12 1478     Author:  Kerrin Champagne  Service:  Vascular Lab  Author Type:  Cardiovascular Sonographer   Filed:  07/06/12 0929  Note Time:  07/06/12 0928          VASCULAR LAB  PRELIMINARY PRELIMINARY PRELIMINARY PRELIMINARY  Bilateral lower extremity venous duplex completed.  Preliminary report: Bilateral: No evidence of DVT, superficial thrombosis, or Baker's Cyst.  SLAUGHTER, [place], RVS  07/06/2012, 9:28 AM   Results for orders placed during the hospital encounter of 07/06/12  GLUCOSE, CAPILLARY      Component Value Range   Glucose-Capillary 187 (*) 70 - 99 mg/dL  CBC WITH DIFFERENTIAL      Component Value Range   WBC 11.4 (*) 4.0 - 10.5 K/uL   RBC 4.13  3.87 - 5.11 MIL/uL   Hemoglobin 11.7 (*) 12.0 - 15.0 g/dL   HCT 29.5 (*) 62.1 - 30.8 %   MCV 80.6  78.0 - 100.0 fL   MCH 28.3  26.0 - 34.0 pg   MCHC 35.1  30.0 - 36.0 g/dL   RDW 65.7 (*) 84.6 - 96.2 %   Platelets 250  150 - 400 K/uL   Neutrophils Relative 70  43 - 77 %   Neutro Abs 8.0 (*) 1.7 - 7.7 K/uL   Lymphocytes Relative 22  12 - 46 %   Lymphs Abs 2.5  0.7 - 4.0 K/uL   Monocytes Relative 8  3 - 12 %   Monocytes Absolute 0.9  0.1 - 1.0 K/uL   Eosinophils Relative 0  0 - 5 %   Eosinophils Absolute 0.0  0.0 - 0.7 K/uL   Basophils Relative 1  0 - 1 %   Basophils Absolute 0.1  0.0 - 0.1 K/uL  BASIC METABOLIC PANEL      Component Value Range   Sodium 135  135 - 145 mEq/L    Potassium 3.9  3.5 - 5.1 mEq/L   Chloride 102  96 - 112 mEq/L   CO2 19  19 - 32 mEq/L   Glucose, Bld 118 (*) 70 - 99 mg/dL   BUN 20  6 - 23 mg/dL   Creatinine, Ser  1.08  0.50 - 1.10 mg/dL   Calcium 9.4  8.4 - 40.9 mg/dL   GFR calc non Af Amer 59 (*) >90 mL/min   GFR calc Af Amer 68 (*) >90 mL/min  PRO B NATRIURETIC PEPTIDE      Component Value Range   Pro B Natriuretic peptide (BNP) 516.3 (*) 0 - 125 pg/mL   Ct Head Wo Contrast  06/15/2012  *RADIOLOGY REPORT*  Clinical Data: Left ankle and left foot numbness.  History of stroke 2 weeks ago.  CT HEAD WITHOUT CONTRAST  Technique:  Contiguous axial images were obtained from the base of the skull through the vertex without contrast.  Comparison: MRI brain 05/18/2012.  CT head 05/18/2012.  Findings: Mild diffuse cerebral atrophy.  Low attenuation change in the deep white matter consistent with small vessel ischemia.  Old lacunar infarcts in the basal ganglia and thalami.  Increasing low attenuation change in the right thalamus likely corresponds to a acute infarct seen on the previous MR examination.  No developing mass effect or midline shift.  No abnormal extra-axial fluid collections.  Gray-white matter junctions are distinct.  Basal cisterns are not effaced.  No evidence of acute intracranial hemorrhage.  Postoperative changes with basilar occipital craniectomy  IMPRESSION: No acute intracranial abnormalities demonstrated.  Chronic atrophy and small vessel ischemic changes.   Original Report Authenticated By: Burman Nieves, M.D.    Mr [facility] Wo Contrast  06/15/2012  *RADIOLOGY REPORT*  Clinical Data:  Left ankle and foot numbness.  History prior stroke.  Hypertensive hyperlipidemic patient with chronic renal disease.  Migraine headaches.  Chiari one malformation treated with suboccipital craniotomy.  MRI BRAIN WITHOUT CONTRAST MRA HEAD WITHOUT CONTRAST  Technique: Multiplanar, multiecho pulse sequences of the brain and surrounding structures  were obtained according to standard protocol without intravenous contrast.  Angiographic images of the head were obtained using MRA technique without contrast.  Comparison: 06/15/2012 CT.  05/18/2012 MR.  04/27/2011 MR brain and MRA circle Willis.  MRI HEAD  Findings:  Acute small infarct anterior aspect of the left lenticular nucleus.  Remote infarcts seen immediately posterior to this region.  Acute tiny non hemorrhagic infarct lateral border of the right hippocampus.  Subacute non hemorrhagic infarct at the junction of the right thalamus and posterior limb of the right internal capsule.  Several remote infarcts involving the basal ganglia bilaterally, corona radiata and centrum semiovale bilaterally. Remote inferior left cerebellar infarcts.  Prominent small vessel disease type changes.  Prior suboccipital craniectomy for history of Chiari malformation.  No intracranial hemorrhage.  No intracranial mass lesion detected on this unenhanced exam.  Exophthalmos.  Decreased signal intensity of bone marrow may be related to patient's habitus.  Anemia not excluded.  Appearance without change.  Air-fluid level left maxillary sinus raise possibility acute sinusitis.  Minimal mucosal thickening ethmoid sinus air cells and inferior aspect of the mastoid air cells.  IMPRESSION:  Acute small infarct anterior aspect of the left lenticular nucleus.  Remote infarcts seen immediately posterior to this region.  Acute tiny non hemorrhagic infarct lateral border of the right hippocampus.  Subacute non hemorrhagic infarct at the junction of the right thalamus and posterior limb of the right internal capsule.  Several remote infarcts involving the basal ganglia bilaterally, corona radiata and centrum semiovale bilaterally. Remote inferior left cerebellar infarcts.  Prominent small vessel disease type changes.  Air-fluid level left maxillary sinus raise possibility acute sinusitis.  MRA HEAD  Findings: Irregularity with areas of  narrowing mild  ectasia involving the cavernous and supraclinoid segment of the internal carotid artery bilaterally.  Moderate to marked narrowing of the right middle cerebral artery bifurcation/proximal branches.  Mild narrowing and irregularity M1 segment left middle cerebral artery.  Moderate focal stenosis A1 segment left anterior cerebral artery.  High-grade focal stenosis A2 segment left anterior cerebral artery.  Moderate irregularity of the middle cerebral artery branches bilaterally.  Poor delineation of the right vertebral artery which although congenitally small also has superimposed significant atherosclerotic type changes.  Ectatic left vertebral artery.  Nonvisualization PICAs.  Mild irregularity and mild narrowing basilar artery.  Question tiny aneurysm of the basilar tip.  This is not appreciated on the prior examination.  Marked tandem stenosis involving portions of the posterior cerebral arteries more notable on the right.  Significant irregularity and narrowing of the superior cerebellar artery bilaterally.  IMPRESSION: Significant intracranial atherosclerotic type changes have progressed since prior examination.  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  This has been made a PRA call report utilizing dashboard call feature.   Original Report Authenticated By: Lacy Duverney, M.D.    Mr Brain Wo Contrast  06/15/2012  *RADIOLOGY REPORT*  Clinical Data:  Left ankle and foot numbness.  History prior stroke.  Hypertensive hyperlipidemic patient with chronic renal disease.  Migraine headaches.  Chiari one malformation treated with suboccipital craniotomy.  MRI BRAIN WITHOUT CONTRAST MRA HEAD WITHOUT CONTRAST  Technique: Multiplanar, multiecho pulse sequences of the brain and surrounding structures were obtained according to standard protocol without intravenous contrast.  Angiographic images of the head were obtained using MRA technique without contrast.  Comparison: 06/15/2012 CT.   05/18/2012 MR.  04/27/2011 MR brain and MRA circle Willis.  MRI HEAD  Findings:  Acute small infarct anterior aspect of the left lenticular nucleus.  Remote infarcts seen immediately posterior to this region.  Acute tiny non hemorrhagic infarct lateral border of the right hippocampus.  Subacute non hemorrhagic infarct at the junction of the right thalamus and posterior limb of the right internal capsule.  Several remote infarcts involving the basal ganglia bilaterally, corona radiata and centrum semiovale bilaterally. Remote inferior left cerebellar infarcts.  Prominent small vessel disease type changes.  Prior suboccipital craniectomy for history of Chiari malformation.  No intracranial hemorrhage.  No intracranial mass lesion detected on this unenhanced exam.  Exophthalmos.  Decreased signal intensity of bone marrow may be related to patient's habitus.  Anemia not excluded.  Appearance without change.  Air-fluid level left maxillary sinus raise possibility acute sinusitis.  Minimal mucosal thickening ethmoid sinus air cells and inferior aspect of the mastoid air cells.  IMPRESSION:  Acute small infarct anterior aspect of the left lenticular nucleus.  Remote infarcts seen immediately posterior to this region.  Acute tiny non hemorrhagic infarct lateral border of the right hippocampus.  Subacute non hemorrhagic infarct at the junction of the right thalamus and posterior limb of the right internal capsule.  Several remote infarcts involving the basal ganglia bilaterally, corona radiata and centrum semiovale bilaterally. Remote inferior left cerebellar infarcts.  Prominent small vessel disease type changes.  Air-fluid level left maxillary sinus raise possibility acute sinusitis.  MRA HEAD  Findings: Irregularity with areas of narrowing mild ectasia involving the cavernous and supraclinoid segment of the internal carotid artery bilaterally.  Moderate to marked narrowing of the right middle cerebral artery  bifurcation/proximal branches.  Mild narrowing and irregularity M1 segment left middle cerebral artery.  Moderate focal stenosis A1 segment left anterior cerebral artery.  High-grade focal stenosis A2 segment left anterior cerebral artery.  Moderate irregularity of the middle cerebral artery branches bilaterally.  Poor delineation of the right vertebral artery which although congenitally small also has superimposed significant atherosclerotic type changes.  Ectatic left vertebral artery.  Nonvisualization PICAs.  Mild irregularity and mild narrowing basilar artery.  Question tiny aneurysm of the basilar tip.  This is not appreciated on the prior examination.  Marked tandem stenosis involving portions of the posterior cerebral arteries more notable on the right.  Significant irregularity and narrowing of the superior cerebellar artery bilaterally.  IMPRESSION: Significant intracranial atherosclerotic type changes have progressed since prior examination.  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  This has been made a PRA call report utilizing dashboard call feature.   Original Report Authenticated By: Lacy Duverney, M.D.    Ir Angio Intra Extracran Sel Com Carotid Innominate Bilat Mod Sed  06/17/2012  *RADIOLOGY REPORT*  Clinical Data:  Severe headaches.  TIAs.  Previous history of ischemic strokes.  Abnormal MRA of the brain.  BILATERAL COMMON CAROTID ARTERIOGRAMS, LEFT VERTEBRAL ARTERY ANGIOGRAM, AND RIGHT SUBCLAVIAN ARTERIOGRAM  Comparison: MRI/MRA of the brain of 06/15/2002.  Following a full explanation of the procedure along with the potential associated complications, an informed witnessed consent was obtained.  The right groin was prepped and draped in the usual sterile fashion.  Thereafter using modified Seldinger technique, transfemoral access into the right common femoral artery was obtained without difficulty.  Over a 0.035-inch guidewire, a 5- [place] Pinnacle sheath was inserted.  Through  this and also over a 0.035-inch guidewire, a 5-French JB1 catheter was advanced to the aortic arch region and selectively positioned in the right common carotid artery, the right subclavian artery, the left common carotid artery and the left vertebral artery.  There were no acute complications.  The patient tolerated the procedure well.  Medications utilized:   Benadryl 25 mg IV.  Contrast: Omnipaque-300 approximately 55 ml.  Findings:  The right common carotid arteriogram demonstrates the right external carotid artery and its major branches to be normal.  The right internal carotid artery at the bulb has a smooth shallow plaque along the posterior wall with approximately less than 20% stenosis by NASCET criteria.  Moderate tortuosity is seen of the proximal third of the right internal carotid artery.  The vessel distal to this opacifies normally to the cranial skull base.  The petrous, cavernous and the supraclinoid segments are normal.  The right middle cerebral artery and the right anterior cerebral artery opacify into capillary and venous phases.  Mild focal stenosis is seen involving the proximal aspect of the superior division of the right middle cerebral artery and also of the right anterior cerebral artery A1 segment.  A right posterior communicating artery is seen opacifying the right posterior cerebral artery distribution.  The left vertebral artery arises from the aortic arch between the origins of the left common carotid artery and the left subclavian artery.  The vessel opacifies normally to the cranial skull base.  There is mild stenosis of the left vertebrobasilar junction proximal to the hypoplastic left posterior inferior cerebellar artery.  The basilar artery, the posterior cerebral arteries, the superior cerebellar arteries and the anterior-inferior cerebellar arteries opacify normally into capillary and venous phases.  Retrograde opacification into the right vertebrobasilar junction to the right  posterior-inferior cerebellar artery is seen.  The left common carotid arteriogram demonstrates the left external carotid artery and its major branches to be normal.  The left internal carotid artery at the bulb is normal.  There is mild tortuosity of the proximal third of the left internal carotid artery.  More distally the vessel opacifies normally to the cranial skull base.  The petrous, cavernous and the supraclinoid segments are normal.  A left posterior communicating artery is seen opacifying the left posterior cerebral artery distribution.  The left middle cerebral artery and the left anterior cerebral artery opacify into capillary and venous phases.  Scattered focal areas of arteriosclerotic narrowing are seen involving the pericallosal and callosal marginal branches of the anterior cerebral artery, and the angular branch of the left middle cerebral artery distally.  IMPRESSION: 1.  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 2.  Scattered intracranial arteriosclerotic changes involving the anterior circulation bilaterally as described above. 3. Mild atherosclerotic disease involving the right carotid bulb as described. 4.  No angiographic evidence of intracranial aneurysms, arteriovenous malformation or dural AV fistula seen. 5. Venous outflow within normal limits.   Original Report Authenticated By: Julieanne Cotton, M.D.    Ir Angio Vertebral Sel Vertebral Uni L Mod Sed  06/17/2012  *RADIOLOGY REPORT*  Clinical Data:  Severe headaches.  TIAs.  Previous history of ischemic strokes.  Abnormal MRA of the brain.  BILATERAL COMMON CAROTID ARTERIOGRAMS, LEFT VERTEBRAL ARTERY ANGIOGRAM, AND RIGHT SUBCLAVIAN ARTERIOGRAM  Comparison: MRI/MRA of the brain of 06/15/2002.  Following a full explanation of the procedure along with the potential associated  complications, an informed witnessed consent was obtained.  The right groin was prepped and draped in the usual sterile fashion.  Thereafter using modified Seldinger technique, transfemoral access into the right common femoral artery was obtained without difficulty.  Over a 0.035-inch guidewire, a 5- [place] Pinnacle sheath was inserted.  Through this and also over a 0.035-inch guidewire, a 5-French JB1 catheter was advanced to the aortic arch region and selectively positioned in the right common carotid artery, the right subclavian artery, the left common carotid artery and the left vertebral artery.  There were no acute complications.  The patient tolerated the procedure well.  Medications utilized:   Benadryl 25 mg IV.  Contrast: Omnipaque-300 approximately 55 ml.  Findings:  The right common carotid arteriogram demonstrates the right external carotid artery and its major branches to be normal.  The right internal carotid artery at the bulb has a smooth shallow plaque along the posterior wall with approximately less than 20% stenosis by NASCET criteria.  Moderate tortuosity is seen of the proximal third of the right internal carotid artery.  The vessel distal to this opacifies normally to the cranial skull base.  The petrous, cavernous and the supraclinoid segments are normal.  The right middle cerebral artery and the right anterior cerebral artery opacify into capillary and venous phases.  Mild focal stenosis is seen involving the proximal aspect of the superior division of the right middle cerebral artery and also of the right anterior cerebral artery A1 segment.  A right posterior communicating artery is seen opacifying the right posterior cerebral artery distribution.  The left vertebral artery arises from the aortic arch between the origins of the left common carotid artery and the left subclavian artery.  The vessel opacifies normally to the cranial skull base.  There is mild stenosis of the left  vertebrobasilar junction proximal to the hypoplastic left posterior inferior cerebellar artery.  The basilar artery, the posterior cerebral arteries, the  superior cerebellar arteries and the anterior-inferior cerebellar arteries opacify normally into capillary and venous phases.  Retrograde opacification into the right vertebrobasilar junction to the right posterior-inferior cerebellar artery is seen.  The left common carotid arteriogram demonstrates the left external carotid artery and its major branches to be normal.  The left internal carotid artery at the bulb is normal.  There is mild tortuosity of the proximal third of the left internal carotid artery.  More distally the vessel opacifies normally to the cranial skull base.  The petrous, cavernous and the supraclinoid segments are normal.  A left posterior communicating artery is seen opacifying the left posterior cerebral artery distribution.  The left middle cerebral artery and the left anterior cerebral artery opacify into capillary and venous phases.  Scattered focal areas of arteriosclerotic narrowing are seen involving the pericallosal and callosal marginal branches of the anterior cerebral artery, and the angular branch of the left middle cerebral artery distally.  IMPRESSION: 1.  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 2.  Scattered intracranial arteriosclerotic changes involving the anterior circulation bilaterally as described above. 3. Mild atherosclerotic disease involving the right carotid bulb as described. 4.  No angiographic evidence of intracranial aneurysms, arteriovenous malformation or dural AV fistula seen. 5. Venous outflow within normal limits.   Original Report Authenticated By: Julieanne Cotton, M.D.     1. Medical noncompliant 2. Uncontrolled hypertension 3. Risk of another  stroke.   4. rhabdo  MDM  Pt with recurrent stroke presents for evaluation of lower extremities edema, and for non compliant with medical treatment.  Pt is depressed but denies SI/HI.  She has not taking any of her regular medication for over a month.    12:04 PM BP is elevated, as high as 180 systolic. Has not been taking her medications.  CXR shows cardiomegaly without pulmonary edema  BNP is 516, no documented CHF.  Normal renal function.  Since pt was sitting on the toilet for prolonged period of time, i checked her total CK and it's elevated at 2469, suggestive of rhabdo.  UA shows large Hgb on urine dipstic but only 0-2 RBC which further suggest rhabdo.  IVF given, will consult [facility] for admission.    1:22 PM I have consulted with [facility] resident, who agrees to see pt in ER and will admit for further care.  Will continue IVF and will monitor.    BP 181/92  Pulse 106  Temp 98.5 F (36.9 C) (Oral)  Resp 18  SpO2 99%  LMP 07/06/2012  I have reviewed nursing notes and vital signs. I personally reviewed the imaging tests through PACS system  I reviewed available ER/hospitalization records thought the EMR      Fayrene Helper, [place] 07/06/12 1327

## 2012-07-06 NOTE — ED Notes (Addendum)
Pt states she had been staying with daughter since being discharged from hosp in Dec for stroke. She left her daughter's house yesterday afternoon.  Yesterday at 2300 she sat on toilet in her small bathroom (her walker could not fit in it).  She was unable to stand up d/t not having her walker with her and feeling as if her legs hurt.  Children arrived and daughter states that pt has NOT been taking meds since her stroke in Nov.  Daughter appears to have caregiver strain (2 children, also taking care of father, works full time in healthcare).    Pt ao x 4.  States she has not been taking meds because they make her tired.  Daughter states she puts meds in pt hand, put pt refuses to take them.  Consider social work.   2+ pitting edema bil.  Lung sounds diminished posteriorly.

## 2012-07-06 NOTE — ED Notes (Signed)
Pt returned  From vascular

## 2012-07-06 NOTE — ED Provider Notes (Signed)
Medical screening examination/treatment/procedure(s) were conducted as a shared visit with non-physician practitioner(s) and myself.  I personally evaluated the patient during the encounter  Flint Melter, MD 07/06/12 1745

## 2012-07-06 NOTE — ED Notes (Signed)
Pt taken to vascular with ticket to ride

## 2012-07-06 NOTE — ED Provider Notes (Signed)
Lindsey [name] is a 52 y.o. female was from her family members, sitting on the commode, having been there for several hours. The patient complains of pain and swelling in her left leg. She was recently hospitalized with a right brain stroke. She's not taking her medications because they "make me feel sleepy".  Exam-she is alert, responsive. She appears depressed Neurologic-oriented x3. Weak left leg appeared to right. Mild, left leg. Swelling. Vitals-abnormal blood pressure elevated.\   Medical decision-making. Stroke with noncompliance. Patient is likely depressed. She will need to be admitted for stabilization  Medical screening examination/treatment/procedure(s) were conducted as a shared visit with non-physician practitioner(s) and myself.  I personally evaluated the patient during the encounter  Flint Melter, MD 07/06/12 1208

## 2012-07-06 NOTE — H&P (Signed)
Family Medicine Teaching [facility] Admission History and Physical Service Pager: [phone]  Patient name: Lindsey [name] Medical record number: [id number] Date of birth: 1961/04/13 Age: 52 y.o. Gender: female  Primary Care Provider: Rodman Pickle, MD  Chief Complaint: weakness and leg pain  History of Present Illness: Lindsey [name] is a 52 y.o. year old female presenting with weakness after being found stranded on a toilet. Patient states that she went to the the restroom last night about 1130 and became acutely weaker while she was sitting on the toilet.  She initially says this was a general weakness, but says this morning she feels a little weaker on her left side.  She was stranded on the toilet this morning, too weak to stand up, until her son came and found her early this morning around 530. She says that since then she has not been able to stand, walk, and is too weak to sit up in bed. She also says that she feels weaker on her L hand and leg than before, but notes she has been a little weaker since her stroke last month.  She denies any symptoms of weakness, visual change, or headache last night before going to the bathroom. She denies headache, visual changes, chest pain, dyspnea, dysuria, cough, confusion, falling, and slurred speech. She does say that for the last two days she has had shaking and chills but denies fever. She is also complaining of BL leg swelling and leg pain that is worse on the L than the right.   She denies smoking, using alcohol, and any drug use.   Patient Active Problem List  Diagnosis  . HYPERLIPIDEMIA  . OBESITY, MORBID  . Essential hypertension, benign  . CVA  . GASTROINTESTINAL DISORDER, FUNCTIONAL  . ARNOLD-CHIARI MALFORMATION  . VERTIGO  . SLEEP APNEA  . HEADACHE  . Other dyspnea and respiratory abnormality  . CHEST PAIN UNSPECIFIED  . EMESIS  . DIARRHEA  . CONTRAST DYE ALLERGY  . BACK PAIN, LUMBAR, WITH RADICULOPATHY  . Neuropathy  .  Well woman exam  . Ear pain  . Acute kidney injury  . Stroke  . CVA (cerebral infarction)   Past Medical History: Past Medical History  Diagnosis Date  . Hypertension   . Neuropathy   . Sleep apnea   . Chiari I malformation   . Hyperlipidemia   . CVA (cerebral infarction)   . Migraine   . Daily headache     "lately" (05/18/12)  . GERD (gastroesophageal reflux disease)   . Chronic kidney disease     not on dialysis  . Stroke 2008;  ?2009;     7 strokes, L sided deficits   Past Surgical History: Past Surgical History  Procedure Date  . Reduction mammaplasty ~ 2002  . Cesarean section 1478; 1983; 1989  . Craniectomy suboccipital w/ cervical laminectomy / chiari 2000's  . Brain surgery    Social History: History  Substance Use Topics  . Smoking status: Never Smoker   . Smokeless tobacco: Never Used  . Alcohol Use: No   For any additional social history documentation, please refer to relevant sections of EMR.  Family History: Family History  Problem Relation Age of Onset  . Hyperlipidemia Mother   . Heart disease Mother   . Hypertension Mother   . Heart disease Sister   . Heart disease Brother    Allergies: Allergies  Allergen Reactions  . Iodine Swelling    Tongue swells up per pt.  Home Medications (Pt not taking all per family):  Albuterol Amlodpine Aspirin Atorvastatin Plavix Gabapentin HCTZ Iron Metoprolol Miralax Zoloft Tramadol Trazadone   Review Of Systems: Per HPI, Otherwise 12 point review of systems was performed and was unremarkable.  Physical Exam: BP 181/92  Pulse 106  Temp 98.5 F (36.9 C) (Oral)  Resp 18  SpO2 99%  LMP 07/06/2012  Exam: Gen: NAD, alert, cooperative with exam but drowsy, lying in bed, tearful at times during the interview.  HEENT: NCAT, EOMI, PERRL, MMM CV: mild tachycardia, regular rhythm, good S1/S2, no murmur Resp: CTABL, no wheezes, non-labored Abd: SNTND, BS present, no guarding or  organomegaly Ext: 1+ edema onL LE, 2+ pitting edema on R LE, negative Homan's sign Neuro: Alert and oriented, strength 5/5 in BL LE, Strength 3/5 in LUE, 5/5 vin RUE, Sensation intact throughout. Face symmetric, speech clear.    Labs and Imaging: CBC BMET   Lab 07/06/12 0642  WBC 11.4*  HGB 11.7*  HCT 33.3*  PLT 250    Lab 07/06/12 0642  NA 135  K 3.9  CL 102  CO2 19  BUN 20  CREATININE 1.08  GLUCOSE 118*  CALCIUM 9.4      05/19/2012 05:06  Cholesterol 170  Triglycerides 122  HDL 56  LDL (calc) 90  VLDL 24  Total CHOL/HDL Ratio 3.0   A1c 05/19/2012- 4.9  CXR 07/06/12 IMPRESSION:  Cardiomegaly without pulmonary edema.  CT Head w/o 07/06/2012 IMPRESSION:  No acute abnormalities. Numerous old strokes.  Assessment and Plan: [name] is a 52 y.o. year old female with PMH of multiple strokes and TIAs HTN, and medical non-compliance, presenting with weakness and leg pain after being found stranded on a toilet all night. It's possible that with her acutely worsening weakness and 3/5 strength on her L upper extremity that she has had either progression of an old stroke, a new infarct, or a TIA.   Rhabdomyolysis - Caused by being stranded on toilet all night, doesn't appear to have affected her kidneys too severely, will monitor.  - UA with Large blood and 0-2 RBCs - CK 2469, Cre 1.08 - Q8 CK and BMP - IV NS at 125  Lower extremity edema - Likely dependent edema as it is new onset after being stranded lll night, also consider low oncotic pressure as etiology.  - BL LE venous duplex does not show any evidence of DVT - obtain CMP - Monitor, elevate legs, SCDs  Recurrent Stroke - PMH of multiple strokes, non-compliance with medications  - On aspirin ans plavix, continue - Recent MRI 06/15/12.  Patient drowsy currently as she didn't sleep all night so neuro exam is possibly not trustworthy- will re-examen when more alert and consider MRI. She was far outside the  window for using TPA at presentation this AM.  - prior carotid doppler  From 05/20/2012 shows no significant carotid artery stenosis, defer new study for now - LDL 90 in 11/13, A1c 4.9 on 05/19/2012 - Monitor with serial neuro exams.  - PT/OT consult   Diastolic CHF - Stable - Echo 05/20/2012 shows EF 60-65% and Grade 1 diastolic dysf - Rehydrate gently and monitor for fluid overload  HTN - Elevated today as high as 189/114 - will tolerate higher pressures with risk of stroke/TIA - hold HCTZ, amlodipine - Metoprolol XL at 1/2 home dose- 25 mg daily  Protein calorie malnutrition- likely given several recent hospitalizations - possibly contributing to LE edema and weakness - nutrition consult  HLD - Continue lipitor, LDL 90 in 11/13  FEN/GI: RN Stroke swallow eval, then heart healthy diet, NS at 150 ml/hr Prophylaxis: SCDs, lovenox after r/o hemmorhage Disposition: Tele, home  Code Status: Full for now, will clarify  Kevin Fenton, MD 07/06/2012, 1:01 PM  PGY3 Addendum to PGY1 History and Physical:   I have examined and interviewed this patient with Dr. Ermalinda Memos and agree with his findings and plan, with the above highlighted additions/corrections.   Kanchan Gal 07/06/2012 4:05 PM

## 2012-07-06 NOTE — ED Notes (Signed)
Pt sleeping, easily arousable

## 2012-07-06 NOTE — Progress Notes (Signed)
VASCULAR LAB PRELIMINARY  PRELIMINARY  PRELIMINARY  PRELIMINARY  Bilateral lower extremity venous duplex completed.    Preliminary report:  Bilateral:  No evidence of DVT, superficial thrombosis, or Baker's Cyst.   Sarah Baez, RVS 07/06/2012, 9:28 AM

## 2012-07-06 NOTE — Progress Notes (Signed)
Pt passes swallow screen in ED per RN. Ordered heart healthy diet for pt per orders. Pt took pills well whole with water. Marisa Cyphers RN

## 2012-07-07 ENCOUNTER — Inpatient Hospital Stay (HOSPITAL_COMMUNITY): Payer: Medicare Other

## 2012-07-07 LAB — CK: Total CK: 1381 U/L — ABNORMAL HIGH (ref 7–177)

## 2012-07-07 LAB — CBC
MCH: 28.6 pg (ref 26.0–34.0)
Platelets: 245 10*3/uL (ref 150–400)
RBC: 3.22 MIL/uL — ABNORMAL LOW (ref 3.87–5.11)
WBC: 9.2 10*3/uL (ref 4.0–10.5)

## 2012-07-07 LAB — COMPREHENSIVE METABOLIC PANEL
AST: 22 U/L (ref 0–37)
BUN: 15 mg/dL (ref 6–23)
CO2: 21 mEq/L (ref 19–32)
Chloride: 109 mEq/L (ref 96–112)
Creatinine, Ser: 1.15 mg/dL — ABNORMAL HIGH (ref 0.50–1.10)
GFR calc Af Amer: 63 mL/min — ABNORMAL LOW (ref >90)
GFR calc non Af Amer: 55 mL/min — ABNORMAL LOW (ref >90)
Glucose, Bld: 84 mg/dL (ref 70–99)
Total Bilirubin: 0.4 mg/dL (ref 0.3–1.2)

## 2012-07-07 LAB — GLUCOSE, CAPILLARY: Glucose-Capillary: 84 mg/dL (ref 70–99)

## 2012-07-07 MED ORDER — GADOBENATE DIMEGLUMINE 529 MG/ML IV SOLN
20.0000 mL | Freq: Once | INTRAVENOUS | Status: AC
  Administered 2012-07-07: 20 mL via INTRAVENOUS

## 2012-07-07 NOTE — Progress Notes (Signed)
Utilization Review Completed.   Marieli Rudy, RN, BSN Nurse Case Manager  [phone]  

## 2012-07-07 NOTE — Progress Notes (Signed)
Patient evaluated for community based chronic disease management services with [facility] Care Management Program as a benefit of patient's [employer]. Patient will receive a post discharge transition of care call and will be evaluated for monthly home visits for assessments and disease process education. Spoke with patient at bedside to explain [facility] Care Management services. Written consents obtained.  Left contact information and [facility] literature at bedside. Patient indicated that she was not taking her blood pressure medications at home because she felt they made her sleepy.  Spoke with RN Unit Nurse regarding this concern to be addressed with the attending MD.  Made inpatient Case Manager aware that [facility] Care Management following. Of note, [facility] Care Management services does not replace or interfere with any services that are arranged by inpatient case management or social work.  For additional questions or referrals please contact Anibal Henderson BSN RN [facility] Liaison at [phone].

## 2012-07-07 NOTE — Evaluation (Signed)
Physical Therapy Evaluation Patient Details Name: Lindsey [name] MRN: [id number] DOB: [birth year] Today's Date: 07/07/2012 Time: 1100-1140 PT Time Calculation (min): 40 min  PT Assessment / Plan / Recommendation Clinical Impression  PAtient is a 52 y.o. female admitted with weakness and LE pain and swellijng; question new CVA/TIA.  Feels left side weaker.  Presents with decreased independence with  mobility due to pain, weakness, nausea and will benefit from skilled PT to maximize independence and allow return to independent living.  Currently needs 24 hour supervision for safety and will benefit from HHPT.    PT Assessment  Patient needs continued PT services    Follow Up Recommendations  Home health PT;Supervision/Assistance - 24 hour                Equipment Recommendations  None recommended by PT    Recommendations for Other Services  OT   Frequency Min 3X/week    Precautions / Restrictions Precautions Precautions: Fall   Pertinent Vitals/Pain 9/10 in both legs, had medication around 10 this am      Mobility  Bed Mobility Bed Mobility: Supine to Sit Supine to Sit: 4: Min assist Sitting - Scoot to Edge of Bed: 3: Mod assist Details for Bed Mobility Assistance: scooted to edge of bed with pad under patient, cues for technique Transfers Sit to Stand: 4: Min assist;From bed;From chair/3-in-1;With armrests Stand to Sit: 4: Min assist;To bed;With armrests;To chair/3-in-1 Details for Transfer Assistance: cues for reaching for armrests Ambulation/Gait Ambulation/Gait Assistance: 4: Min assist Ambulation Distance (Feet): 3 Feet Assistive device: Rolling walker Ambulation/Gait Assistance Details: pivotal steps to [facility] Modified Rankin (Stroke Patients Only) Pre-Morbid Rankin Score: Moderate disability Modified Rankin: Moderately severe disability              PT Diagnosis: Difficulty walking;Generalized weakness;Acute pain  PT Problem List: Decreased strength;Decreased  activity tolerance;Decreased balance;Decreased mobility;Pain PT Treatment Interventions: DME instruction;Gait training;Stair training;Functional mobility training;Therapeutic activities;Patient/family education;Therapeutic exercise;Balance training   PT Goals Acute Rehab PT Goals PT Goal Formulation: With patient Time For Goal Achievement: 07/15/12 Potential to Achieve Goals: Good Pt will go Supine/Side to Sit: with supervision PT Goal: Supine/Side to Sit - Progress: Goal set today Pt will go Sit to Supine/Side: with supervision PT Goal: Sit to Supine/Side - Progress: Goal set today Pt will go Sit to Stand: with supervision PT Goal: Sit to Stand - Progress: Goal set today Pt will go Stand to Sit: with supervision PT Goal: Stand to Sit - Progress: Goal set today Pt will Ambulate: 51 - 150 feet;with min assist;with rolling walker PT Goal: Ambulate - Progress: Goal set today Pt will Go Up / Down Stairs: 1-2 stairs;with rail(s);with min assist (for home entry (at daughter's home)) PT Goal: Up/Down Stairs - Progress: Goal set today  Visit Information  Last PT Received On: 07/07/12    Subjective Data  Subjective: Plan to go back to daughters home Patient Stated Goal: To return to independent   Prior Functioning  Home Living Lives With: Daughter Available Help at Discharge: Family;Available PRN/intermittently Type of Home: House Home Access: Stairs to enter [employer] of Steps: 2 Entrance Stairs-Rails: Right Home Layout: One level Bathroom Shower/Tub: Engineer, manufacturing systems: Standard Home Adaptive Equipment: Shower chair with back;Walker - rolling;Wheelchair - manual Prior Function Level of Independence: Independent Driving: No Communication Communication: No difficulties    Cognition  Overall Cognitive Status: Appears within functional limits for tasks assessed/performed Arousal/Alertness: Awake/alert Orientation Level: Appears intact for tasks  assessed Behavior During  Session: [facility] for tasks performed    Extremity/Trunk Assessment Right Lower Extremity Assessment RLE ROM/Strength/Tone: Deficits RLE ROM/Strength/Tone Deficits: AROM grossly WFL; strength: hip flexion 3-/5, knee extension 4-/5, flexion 3+/5 RLE Sensation: WFL - Light Touch Left Lower Extremity Assessment LLE ROM/Strength/Tone: Deficits LLE ROM/Strength/Tone Deficits: AROM grossly WFL; strength: hip flexion 2+/5, knee extension 3+/5, knee flexion 2+/5 LLE Sensation: WFL - Light Touch   Balance Static Sitting Balance Static Sitting - Balance Support: Feet unsupported Static Sitting - Level of Assistance: 7: Independent Dynamic Standing Balance Dynamic Standing - Balance Support: Bilateral upper extremity supported Dynamic Standing - Level of Assistance: 4: Min assist  End of Session PT - End of Session Equipment Utilized During Treatment: Gait belt Activity Tolerance: Patient limited by pain (limited by nausea) Patient left: in bed;with call bell/phone within reach  GP Functional Assessment Tool Used: Clinical observation Functional Limitation: Mobility: Walking and moving around Mobility: Walking and Moving Around Current Status (U9811): At least 40 percent but less than 60 percent impaired, limited or restricted Mobility: Walking and Moving Around Goal Status [phone]): At least 1 percent but less than 20 percent impaired, limited or restricted   [facility] 07/07/2012, 11:58 AM

## 2012-07-07 NOTE — Progress Notes (Signed)
Family Medicine Teaching Service Daily Progress Note Service Page: [phone]  Patient Assessment: 52 y.o. year old female with PMH of multiple strokes and TIAs HTN, and medical non-compliance, presenting with weakness and leg pain after being found stranded on a toilet all night.  Subjective:  Patient states that her weakness is improved today and that she does not have any new weakness. Denies dyspnea and chest pain.   Objective: Temp:  [98 F (36.7 C)-99.7 F (37.6 C)] 99.2 F (37.3 C) (01/02 0525) Pulse Rate:  [88-108] 88  (01/02 0525) Resp:  [18-20] 18  (01/02 0525) BP: (148-181)/(80-103) 148/85 mmHg (01/02 0525) SpO2:  [97 %-99 %] 98 % (01/02 0525) Weight:  [190 lb 11.2 oz (86.5 kg)-191 lb 2.2 oz (86.7 kg)] 190 lb 11.2 oz (86.5 kg) (01/02 0525) Exam: Gen: NAD, alert, cooperative with exam, sits HEENT: NCAT, EOMI, PERRL, MMM  CV: mild tachycardia, regular rhythm, good S1/S2, no murmur  Resp: CTABL, no wheezes, non-labored  Abd: SNTND, BS present, no guarding or organomegaly  Ext: 1+ edema onL LE, 2+ pitting edema on R LE, negative Homan's sign  Neuro: Alert and oriented, strength 5/5 in BL LE, Strength 4/5 in LUE, 5/5 in RUE, Sensation intact throughout. Face symmetric, speech clear. CN2-12 intact  I have reviewed the patient's medications, labs, imaging, and diagnostic testing.  Notable results are summarized below.  CBC BMET   Lab 07/07/12 0455 07/06/12 0642  WBC 9.2 11.4*  HGB 9.2* 11.7*  HCT 26.2* 33.3*  PLT 245 250    Lab 07/07/12 0455 07/06/12 0642  NA 140 135  K 3.5 3.9  CL 109 102  CO2 21 19  BUN 15 20  CREATININE 1.15* 1.08  GLUCOSE 84 118*  CALCIUM 8.5 9.4     Imaging/Diagnostic Tests:  CXR 07/06/12  IMPRESSION:  Cardiomegaly without pulmonary edema.   CT Head w/o 07/06/2012  IMPRESSION:  No acute abnormalities. Numerous old strokes   Plan: Lindsey [name] is a 52 y.o. year old female with PMH of multiple strokes and TIAs HTN, and medical  non-compliance, presenting with weakness and leg pain after being found stranded on a toilet all night. With resolution of some of her symptoms it's likely that she had a TIA.   Rhabdomyolysis  - Caused by being stranded on toilet all night, doesn't appear to have affected her kidneys too severely, will monitor.  - UA with Large blood and 0-2 RBCs  - CK 2469 ->  1381 - Q8 CK and BMP  - IV NS at 150, with small bump in Cre will increase to 200  Lower extremity edema and pain - Likely dependent edema as it is new onset after being stranded lll night - BL LE venous duplex does not show any evidence of DVT  - Monitor, elevate legs, Ted hose  Recurrent Stroke  - PMH of multiple strokes, non-compliance with medications  - On aspirin ans plavix, continue  - Recent MRI 06/15/12. - perhaps some mild weakening in L hand from previous exam, consider MRI although I don't believe it will change our therapy  - prior carotid doppler From 05/20/2012 shows no significant carotid artery stenosis, defer new study for now  - LDL 90 in 11/13, A1c 4.9 on 05/19/2012  - Monitor with serial neuro exams.  - PT/OT consult   Diastolic CHF  - Stable  - Echo 05/20/2012 shows EF 60-65% and Grade 1 diastolic dysf  - Rehydrate gently and monitor for fluid overload  HTN  - Elevated  Overnight as high as 160/89 - will tolerate higher pressures with TIA  - hold HCTZ, amlodipine  - Metoprolol XL at 1/2 home dose- 25 mg daily   Protein calorie malnutrition- likely given several recent hospitalizations  - possibly contributing to LE edema and weakness  - nutrition consult   HLD  - Continue lipitor, LDL 90 in 11/13    FEN/GI: RN Stroke swallow eval, then heart healthy diet, NS at 150 ml/hr  Prophylaxis: SCDs, lovenox after r/o hemmorhage  Disposition: Tele, home when improved. Will likely need hom ehealth PT/OT vs Inpt rtehab Code Status: Full for now, will clarify   Kevin Fenton, MD 07/07/2012, 9:59  AM

## 2012-07-07 NOTE — Evaluation (Signed)
Occupational Therapy Evaluation Patient Details Name: Lindsey [name] MRN: [id number] DOB: December 06, 1960 Today's Date: 07/07/2012 Time: 4098-1191 OT Time Calculation (min): 29 min  OT Assessment / Plan / Recommendation Clinical Impression  Pt admitted with weakness and LE pain and swelling. Will continue to benefit from acute OT services to address below problem list in prep for return home with daughter.    OT Assessment  Patient needs continued OT Services    Follow Up Recommendations  No OT follow up;Supervision/Assistance - 24 hour    Barriers to Discharge Decreased caregiver support Pt reports daughter works as Lawyer and won't be IT trainer. Will need to confirm this with family.  Equipment Recommendations  3 in 1 bedside comode    Recommendations for Other Services    Frequency  Min 2X/week    Precautions / Restrictions     Pertinent Vitals/Pain See vitals    ADL  Upper Body Bathing: Performed;Chest;Right arm;Abdomen;Left arm;Set up Where Assessed - Upper Body Bathing: Unsupported sitting Lower Body Bathing: Performed;Min guard Where Assessed - Lower Body Bathing: Supported sit to stand Upper Body Dressing: Performed;Set up Where Assessed - Upper Body Dressing: Unsupported sitting Lower Body Dressing: Performed;Minimal assistance (socks) Where Assessed - Lower Body Dressing: Unsupported sitting Toilet Transfer: Performed;Minimal assistance Toilet Transfer Method: Stand pivot Toilet Transfer Equipment: Bedside commode Toileting - Clothing Manipulation and Hygiene: Performed;Min guard Where Assessed - Engineer, mining and Hygiene: Standing Equipment Used: Rolling walker Transfers/Ambulation Related to ADLs: min assist with RW. assist to safely maneuver RW ADL Comments: Increased time due to bil LE pain.    OT Diagnosis: Generalized weakness;Acute pain  OT Problem List: Decreased activity tolerance;Pain;Impaired balance (sitting and/or standing) OT  Treatment Interventions: Self-care/ADL training;DME and/or AE instruction;Therapeutic activities;Patient/family education;Balance training   OT Goals Acute Rehab OT Goals OT Goal Formulation: With patient Time For Goal Achievement: 07/14/12 Potential to Achieve Goals: Good ADL Goals Pt Will Perform Grooming: with modified independence;Standing at sink ADL Goal: Grooming - Progress: Goal set today Pt Will Transfer to Toilet: with modified independence;Ambulation;with DME;Comfort height toilet ADL Goal: Toilet Transfer - Progress: Goal set today Pt Will Perform Toileting - Clothing Manipulation: with modified independence;Sitting on 3-in-1 or toilet;Standing ADL Goal: Toileting - Clothing Manipulation - Progress: Goal set today Pt Will Perform Toileting - Hygiene: with modified independence;Sit to stand from 3-in-1/toilet ADL Goal: Toileting - Hygiene - Progress: Goal set today Additional ADL Goal #1: Pt will perform full bathing/dressing ADLs at mod I level. ADL Goal: Additional Goal #1 - Progress: Goal set today Miscellaneous OT Goals Miscellaneous OT Goal #1: Pt will perform bed mobility at mod I level as precursor for EOB ADLs. OT Goal: Miscellaneous Goal #1 - Progress: Goal set today  Visit Information  Last OT Received On: 07/07/12 Assistance Needed: +1    Subjective Data  Subjective: "I didn't know legs could swell so much"   Prior Functioning     Home Living Lives With: Daughter Available Help at Discharge: Family;Available PRN/intermittently Type of Home: House (going to daughter's house from hospital) Home Access: Stairs to enter [employer] of Steps: 2 Entrance Stairs-Rails: Right Home Layout: One level Bathroom Shower/Tub: Engineer, manufacturing systems: Standard Home Adaptive Equipment: Tub transfer bench;Walker - rolling;Wheelchair - manual Prior Function Level of Independence: Independent Comments: Had been living with daughter after leaving CIR  but had just recently returned to her own home where she was only 2 days before being admitted to [facility] Dominant Hand: Right  Vision/Perception     Cognition  Overall Cognitive Status: Appears within functional limits for tasks assessed/performed Arousal/Alertness: Awake/alert Orientation Level: Appears intact for tasks assessed Behavior During Session: [facility] for tasks performed    Extremity/Trunk Assessment Right Upper Extremity Assessment RUE ROM/Strength/Tone: [facility] for tasks assessed Left Upper Extremity Assessment LUE ROM/Strength/Tone: [facility] for tasks assessed;Deficits LUE ROM/Strength/Tone Deficits: 4/5 throughout     Mobility Bed Mobility Bed Mobility: Supine to Sit;Sitting - Scoot to Edge of Bed;Sit to Supine Supine to Sit: 4: Min assist Sitting - Scoot to Edge of Bed: 4: Min assist Sit to Supine: 4: Min assist Details for Bed Mobility Assistance: Assist to support LEs due to pain. Transfers Transfers: Sit to Stand;Stand to Sit Sit to Stand: 4: Min assist;With upper extremity assist;With armrests;From bed;From chair/3-in-1 Stand to Sit: 4: Min guard;With armrests;With upper extremity assist;To bed;To chair/3-in-1 Details for Transfer Assistance: Max verbal cueing for safe hand placement.     Shoulder Instructions     Exercise     Balance Dynamic Standing Balance Dynamic Standing - Balance Support: Left upper extremity supported;During functional activity Dynamic Standing - Level of Assistance: 4: Min assist Dynamic Standing - Balance Activities: Lateral lean/weight shifting;Forward lean/weight shifting Dynamic Standing - Comments: Assist for steadying while performing toileting hygiene task. RUE supported on RW.   End of Session OT - End of Session Equipment Utilized During Treatment:  (RW) Patient left: in bed;with call bell/phone within reach;with nursing in room Nurse Communication: Mobility status  GO    07/07/2012 Cipriano Mile OTR/L Pager  [phone] Office [phone]  Cipriano Mile 07/07/2012, 4:02 PM

## 2012-07-07 NOTE — H&P (Signed)
FMTS Attending Admission Note: Lindsey Levy MD [phone] pager office [phone] I  have seen and examined this patient, reviewed their chart. I have discussed this patient with the resident. I agree with the resident's findings, assessment and care plan. Patien has some mild swelling B LE--probably from positional edema given her situation prior to admission (stranded on toilet). Mild rhabdomyolyisis. I did discuss with her that she should consider getting a Life Alert or some other type of system for safety at home. She said cost was a big issue. Will ask SW if any subsidies etc available for this.

## 2012-07-07 NOTE — Progress Notes (Signed)
FMTS Attending Daily Note: Mads Borgmeyer MD [phone] pager office [phone] I  have seen and examined this patient, reviewed their chart. I have discussed this patient with the resident. I agree with the resident's findings, assessment and care plan. 

## 2012-07-08 LAB — BASIC METABOLIC PANEL
BUN: 11 mg/dL (ref 6–23)
Chloride: 107 mEq/L (ref 96–112)
Creatinine, Ser: 1.06 mg/dL (ref 0.50–1.10)
GFR calc Af Amer: 70 mL/min — ABNORMAL LOW (ref >90)

## 2012-07-08 LAB — CBC
HCT: 25.3 % — ABNORMAL LOW (ref 36.0–46.0)
MCV: 81.1 fL (ref 78.0–100.0)
RDW: 15.5 % (ref 11.5–15.5)
WBC: 8.9 10*3/uL (ref 4.0–10.5)

## 2012-07-08 MED ORDER — ONDANSETRON HCL 4 MG PO TABS
4.0000 mg | ORAL_TABLET | Freq: Three times a day (TID) | ORAL | Status: DC | PRN
  Administered 2012-07-08: 4 mg via ORAL
  Filled 2012-07-08: qty 1

## 2012-07-08 MED ORDER — HYDROCHLOROTHIAZIDE 25 MG PO TABS
25.0000 mg | ORAL_TABLET | Freq: Every day | ORAL | Status: DC
  Administered 2012-07-08 – 2012-07-12 (×5): 25 mg via ORAL
  Filled 2012-07-08 (×5): qty 1

## 2012-07-08 MED ORDER — POTASSIUM CHLORIDE CRYS ER 20 MEQ PO TBCR
20.0000 meq | EXTENDED_RELEASE_TABLET | Freq: Once | ORAL | Status: AC
  Administered 2012-07-08: 20 meq via ORAL

## 2012-07-08 NOTE — Progress Notes (Signed)
FMTS Attending Daily Note: Alishia Lebo MD [phone] pager office [phone] I  have seen and examined this patient, reviewed their chart. I have discussed this patient with the resident. I agree with the resident's findings, assessment and care plan. 

## 2012-07-08 NOTE — Progress Notes (Signed)
Physical Therapy Treatment Patient Details Name: Lindsey [name] MRN: [id number] DOB: 01/24/1961 Today's Date: 07/08/2012 Time: 4098-1191 PT Time Calculation (min): 26 min  PT Assessment / Plan / Recommendation Comments on Treatment Session  Pt mobility much improved.  Will continue to progress pt for return to home.   Pt is near her baseline level     Follow Up Recommendations  Home health PT;Supervision/Assistance - 24 hour     Does the patient have the potential to tolerate intense rehabilitation     Barriers to Discharge        Equipment Recommendations  None recommended by PT    Recommendations for Other Services    Frequency Min 3X/week   Plan Discharge plan remains appropriate;Frequency remains appropriate    Precautions / Restrictions Precautions Precautions: Fall Precaution Comments: L hemiparesis, h/o "vertigo" per pt  Restrictions Weight Bearing Restrictions: No   Pertinent Vitals/Pain No pain     Mobility  Bed Mobility Bed Mobility: Not assessed Transfers Transfers: Sit to Stand;Stand to Sit Sit to Stand: 5: Supervision;From chair/3-in-1;With upper extremity assist Stand to Sit: 5: Supervision;To chair/3-in-1;With upper extremity assist Details for Transfer Assistance: Min verbal cueing for safe hand placement.   Ambulation/Gait Ambulation/Gait Assistance: 5: Supervision Ambulation Distance (Feet): 80 Feet (80 feet x two) Assistive device: Rolling walker Ambulation/Gait Assistance Details: Cues for safe distancing from walker and upright trunk posture.  Gait Pattern: Step-through pattern;Decreased step length - right;Decreased step length - left;Decreased stride length Stairs: No Modified Rankin (Stroke Patients Only) Pre-Morbid Rankin Score: Moderate disability Modified Rankin: Moderately severe disability    Exercises     PT Diagnosis:    PT Problem List:   PT Treatment Interventions:     PT Goals Acute Rehab PT Goals PT Goal Formulation: With  patient Time For Goal Achievement: 07/15/12 Potential to Achieve Goals: Good Pt will go Supine/Side to Sit: with supervision Pt will go Sit to Supine/Side: with supervision Pt will go Sit to Stand: with supervision PT Goal: Sit to Stand - Progress: Met Pt will go Stand to Sit: with supervision PT Goal: Stand to Sit - Progress: Met Pt will Ambulate: 51 - 150 feet;with min assist;with rolling walker PT Goal: Ambulate - Progress: Progressing toward goal Pt will Go Up / Down Stairs: 1-2 stairs;with rail(s);with min assist  Visit Information  Last PT Received On: 07/08/12 Assistance Needed: +1    Subjective Data  Subjective: Plan to go back to daughters home Patient Stated Goal: To return to independent   Cognition  Overall Cognitive Status: Appears within functional limits for tasks assessed/performed Arousal/Alertness: Awake/alert Orientation Level: Appears intact for tasks assessed Behavior During Session: [facility] for tasks performed    Balance  Balance Balance Assessed: No  End of Session PT - End of Session Equipment Utilized During Treatment: Gait belt Activity Tolerance: Patient tolerated treatment well Patient left: in chair;with call bell/phone within reach Nurse Communication: Mobility status   GP     Lindsey [name] 07/08/2012, 5:23 PM Lindsey [name] DPT [phone]

## 2012-07-08 NOTE — Progress Notes (Signed)
Referral received for SNF. Chart reviewed and CSW has spoken with RNCM who indicates that patient is for DC to home with Home Health.  CSW to sign off. Please re-consult if CSW needs arise.  Lorri Frederick. [place]  [phone]

## 2012-07-08 NOTE — Progress Notes (Signed)
Family Medicine Teaching Service Daily Progress Note Service Page: [phone]  Patient Assessment: 52 y.o. year old female with PMH of multiple strokes and TIAs HTN, and medical non-compliance, presenting with weakness and leg pain after being found stranded on a toilet all night.  Subjective:  Patient states that her weakness is still decreased in teh L hand. Still having BL leg pain, mostly in L upper thigh/groin. States swelling is the same. Doesn't feel like she can go home today because of leg pain.    Objective: Temp:  [97.3 F (36.3 C)-98.7 F (37.1 C)] 98.7 F (37.1 C) (01/03 0530) Pulse Rate:  [71-87] 71  (01/03 0530) Resp:  [18-19] 18  (01/03 0530) BP: (143-153)/(80-84) 153/80 mmHg (01/03 0530) SpO2:  [95 %-99 %] 98 % (01/03 0530) Weight:  [198 lb 10.2 oz (90.1 kg)] 198 lb 10.2 oz (90.1 kg) (01/03 0530) Exam: Gen: NAD, alert, cooperative with exam, sits HEENT: NCAT, EOMI, PERRL, MMM  CV: mild tachycardia, regular rhythm, good S1/S2, no murmur  Resp: CTABL, no wheezes, non-labored  Abd: SNTND, BS present, no guarding or organomegaly  Ext: 1+ edema onL LE, 2+ pitting edema on R LE, negative Homan's sign  Neuro: Alert and oriented, strength 5/5 in BL LE, Strength 4/5 in LUE, 5/5 in RUE, Sensation intact throughout. Face symmetric, speech clear. CN2-12 intact  I have reviewed the patient's medications, labs, imaging, and diagnostic testing.  Notable results are summarized below.  CBC BMET   Lab 07/08/12 0630 07/07/12 0455 07/06/12 0642  WBC 8.9 9.2 11.4*  HGB 8.8* 9.2* 11.7*  HCT 25.3* 26.2* 33.3*  PLT 235 245 250    Lab 07/08/12 0630 07/07/12 0455 07/06/12 0642  NA 137 140 135  K 3.4* 3.5 3.9  CL 107 109 102  CO2 22 21 19   BUN 11 15 20   CREATININE 1.06 1.15* 1.08  GLUCOSE 82 84 118*  CALCIUM 8.5 8.5 9.4     Imaging/Diagnostic Tests:  CXR 07/06/12  IMPRESSION:  Cardiomegaly without pulmonary edema.   CT Head w/o 07/06/2012  IMPRESSION:  No acute abnormalities.  Numerous old strokes  MRI Brain W/W/o 07/07/2012 Extensive chronic microvascular ischemic changes. No acute  infarct.   Plan: Lindsey [name] is a 52 y.o. year old female with PMH of multiple strokes and TIAs HTN, and medical non-compliance, presenting with weakness and leg pain after being found stranded on a toilet all night. With resolution of some of her symptoms it's likely that she had a TIA.   Rhabdomyolysis  - Caused by being stranded on toilet all night, doesn't appear to have affected her kidneys too severely, will monitor.  - UA with Large blood and 0-2 RBCs  - CK 2469 ->  1381,  - Q8 CK and BMP  - IV NS at 100, cut down to [facility]  Lower extremity edema and pain - Likely dependent edema as it is new onset after being stranded lll night, likely muscle spasm from same position overnight.  - BL LE venous duplex does not show any evidence of DVT  - Monitor, elevate legs, Ted hose  Recurrent Stroke  - PMH of multiple strokes, non-compliance with medications  - On aspirin and plavix, continue  - Recent MRI 06/15/12. - perhaps some mild weakening in L hand from previous exam,  - MRI from 05/07/13 shows old infarcts, no new strokes.  - prior carotid doppler From 05/20/2012 shows no significant carotid artery stenosis, defer new study for now  - LDL 90  in 11/13, A1c 4.9 on 05/19/2012  - Monitor with serial neuro exams.  - PT/OT consult - home health PT, No OT f/u, 24 hour supervision  Diastolic CHF  - Stable  - Echo 05/20/2012 shows EF 60-65% and Grade 1 diastolic dysf  - Rehydrate gently and monitor for fluid overload   HTN  - Elevated, but reasonable  - will tolerate higher pressures with TIA  - hold HCTZ, amlodipine at admission.   -add back HCTZ today - Metoprolol XL at 1/2 home dose- 25 mg daily   Protein calorie malnutrition- likely given several recent hospitalizations  - possibly contributing to LE edema and weakness  - nutrition consult   HLD  - Continue lipitor,  LDL 90 in 11/13    FEN/GI: RN Stroke swallow eval, then heart healthy diet, NS at 150 ml/hr  Prophylaxis: SCDs, lovenox after r/o hemmorhage  Disposition: Tele, home when improved.  Code Status: Full for now, will clarify   Kevin Fenton, MD 07/08/2012, 8:04 AM

## 2012-07-09 LAB — BASIC METABOLIC PANEL
BUN: 10 mg/dL (ref 6–23)
Calcium: 9 mg/dL (ref 8.4–10.5)
Creatinine, Ser: 1.11 mg/dL — ABNORMAL HIGH (ref 0.50–1.10)
GFR calc Af Amer: 66 mL/min — ABNORMAL LOW (ref >90)
GFR calc non Af Amer: 57 mL/min — ABNORMAL LOW (ref >90)
Potassium: 3.4 mEq/L — ABNORMAL LOW (ref 3.5–5.1)

## 2012-07-09 LAB — CBC
HCT: 27.3 % — ABNORMAL LOW (ref 36.0–46.0)
MCH: 28.7 pg (ref 26.0–34.0)
MCHC: 35.2 g/dL (ref 30.0–36.0)
MCV: 81.7 fL (ref 78.0–100.0)
RDW: 15.4 % (ref 11.5–15.5)

## 2012-07-09 MED ORDER — METOPROLOL TARTRATE 25 MG PO TABS
25.0000 mg | ORAL_TABLET | Freq: Once | ORAL | Status: AC
  Administered 2012-07-09: 25 mg via ORAL
  Filled 2012-07-09: qty 1

## 2012-07-09 MED ORDER — METOPROLOL SUCCINATE ER 50 MG PO TB24
50.0000 mg | ORAL_TABLET | Freq: Every day | ORAL | Status: DC
  Administered 2012-07-10 – 2012-07-12 (×3): 50 mg via ORAL
  Filled 2012-07-09 (×3): qty 1

## 2012-07-09 MED ORDER — AMLODIPINE BESYLATE 10 MG PO TABS: 10.0000 mg | ORAL_TABLET | Freq: Every day | ORAL | Status: DC

## 2012-07-09 NOTE — Progress Notes (Signed)
Called MD on call for FMTS to clarify if pt needed to have an order for Telemetry, due to no existing order and stated that Pt no longer needed telemetry. Monitor taken off.

## 2012-07-09 NOTE — Progress Notes (Signed)
Pt complained of nausea but no vomiting. MD notified and new order for Zofran. Given and noted effective.

## 2012-07-09 NOTE — Progress Notes (Signed)
Physical Therapy Treatment Patient Details Name: Lindsey [name] MRN: [id number] DOB: [birth year] Today's Date: 07/09/2012 Time: 1478-2956 PT Time Calculation (min): 28 min  PT Assessment / Plan / Recommendation Comments on Treatment Session  Progressing well. Reports she won't have 24 hour assist at home but doesn't want to go to a nursing home. She discussed multiple discharge options (sister in [place], son in [place] or having a friend stay with her during the day while daughter works). Continue to recommend supervision for all mobility.     Follow Up Recommendations  Home health PT;Supervision for mobility/OOB     Does the patient have the potential to tolerate intense rehabilitation     Barriers to Discharge        Equipment Recommendations  None recommended by PT    Recommendations for Other Services    Frequency Min 3X/week   Plan Discharge plan remains appropriate;Frequency remains appropriate    Precautions / Restrictions Precautions Precautions: Fall Precaution Comments: L hemiparesis    Mobility  Bed Mobility Bed Mobility: Supine to Sit Right Sidelying to Sit: 5: Supervision Sit to Supine: 6: Modified independent (Device/Increase time) Details for Bed Mobility Assistance: verbal cues for ease of movement and technique to sit up from flat bed Transfers Transfers: Sit to Stand;Stand to Sit Sit to Stand: 5: Supervision;From bed;With upper extremity assist Stand to Sit: 5: Supervision;To bed;With upper extremity assist Details for Transfer Assistance: verbal cues initially for safe hand placement Ambulation/Gait Ambulation/Gait Assistance: 5: Supervision Ambulation Distance (Feet): 350 Feet Assistive device: Rolling walker Ambulation/Gait Assistance Details: verbal cues for safety with RW especially with turns, as she fatigues her left leg drags slightly Gait Pattern: Shuffle;Step-through pattern Stairs: No    Exercises General Exercises - Lower  Extremity Long Arc Quad: AROM;Both;20 reps;Seated Hip Flexion/Marching: AROM;Both;20 reps;Seated Toe Raises: AROM;Both;20 reps;Seated Heel Raises: AROM;Both;20 reps;Seated Other Exercises Other Exercises: sit<>stand x 10     PT Goals Acute Rehab PT Goals Pt will go Supine/Side to Sit: with modified independence PT Goal: Supine/Side to Sit - Progress: Updated due to goal met Pt will go Sit to Supine/Side: with modified independence PT Goal: Sit to Supine/Side - Progress: Updated due to goal met Pt will go Sit to Stand: with modified independence PT Goal: Sit to Stand - Progress: Updated due to goal met Pt will go Stand to Sit: with modified independence PT Goal: Stand to Sit - Progress: Updated due to goals met Pt will Ambulate: >150 feet;with modified independence;with least restrictive assistive device PT Goal: Ambulate - Progress: Updated due to goal met  Visit Information  Last PT Received On: 07/09/12 Assistance Needed: +1    Subjective Data  Subjective: My legs hurts a little bit today because of walking yesterday Patient Stated Goal: home   Cognition  Overall Cognitive Status: Appears within functional limits for tasks assessed/performed Arousal/Alertness: Awake/alert Orientation Level: Appears intact for tasks assessed Behavior During Session: [facility] for tasks performed    Balance     End of Session PT - End of Session Equipment Utilized During Treatment: Gait belt Activity Tolerance: Patient tolerated treatment well Patient left: in bed;with call bell/phone within reach Nurse Communication: Mobility status   GP     [facility] HELEN 07/09/2012, 12:47 PM

## 2012-07-09 NOTE — Discharge Summary (Signed)
Family Medicine Teaching [facility] Discharge Summary  Patient name: Lindsey [name] Medical record number: [id number] Date of birth: 02-25-1961 Age: 52 y.o. Gender: female Date of Admission: 07/06/2012  Date of Discharge: 07/12/2012 Admitting Physician: Nestor Ramp, MD  Primary Care Provider: Rodman Pickle, MD  Indication for Hospitalization: weakness, rhabdomyolysis Discharge Diagnoses:  1. TIA 2. Rhabdomyolysis 3. Chronic diastolic CHF 4. HTN 5. HLD 6. Lower extremity edema and pain  Consultations: None  Significant Labs and Imaging:   Lab 07/11/12 1052 07/09/12 0500 07/08/12 0630  HGB 10.0* 9.6* 8.8*  HCT 28.5* 27.3* 25.3*  WBC 8.6 8.2 8.9  PLT 282 249 235    Lab 07/09/12 0917 07/08/12 0630 07/07/12 0455 07/06/12 0642  NA 141 137 140 135  K 3.4* 3.4* -- --  CL 105 107 109 102  CO2 27 22 21 19   GLUCOSE 132* 82 84 118*  BUN 10 11 15 20   CREATININE 1.11* 1.06 1.15* 1.08  CALCIUM 9.0 8.5 8.5 9.4  MG -- -- -- --  PHOS -- -- -- --   CXR 07/06/12  IMPRESSION:  Cardiomegaly without pulmonary edema.   CT Head w/o 07/06/2012  IMPRESSION:  No acute abnormalities. Numerous old strokes   MRI Brain W/W/o 07/07/2012  Extensive chronic microvascular ischemic changes. No acute  infarct.  Procedures: None  Brief Hospital Course:  Lindsey [name] is a 52 y.o. year old female with PMH of multiple strokes and TIAs HTN, and medical non-compliance, presenting with weakness and leg pain after being found stranded on a toilet all night. With resolution of some of her symptoms and lack of new ischemia on MRI it's likely that she had a TIA.   Rhabdomyolysis Presumably caused by being stranded on the toilet overnight. It was mild with CK as high as 2469 which decreased to 1381 overnight with IVF. Her Creatinine maintained at or below baseline throughout her admission as shown above.    Recurrent Stroke  With her presentation of acute onset weakness (mostly L side per pt Hx) and her  Hx of recurrent strokes, a stroke was highly considered. However an MRI was obatined showing no new stroke and her L upper extremity weakness improved some. She did state that the LUE weakness was residual from previous stroke but worsened on admission here. For secondary prevention we kept her on plavix and lipitor 40 mg daily. She was evaluated wby PT/OT who felt that 24 hour supervison or SNF placement was appropriate for her.   Lower extremity edema and pain, edema Her edema was thought to be dependent and improved greatly with Ted hose and elevating her legs. Also her L leg pain was though to be positional and gradually improved throughout her admission being controlled on tramadol.    Diastolic CHF  Remained stable throughout admission. Previous echo showed EF 60-65% and Grade 1 diastolic dysf .   HTN  All home meds were held initially as stroke was suspected so higher pressures were tolerated. We slowly restarted each home medication throughout the rest of the admission.   HLD  Home dose of lipitor was continued as her last LDL was 90 on 11/13.     Discharge Medications:    Medication List     As of 07/12/2012 12:15 PM    TAKE these medications         albuterol 108 (90 BASE) MCG/ACT inhaler   Commonly known as: PROVENTIL HFA;VENTOLIN HFA   Inhale 2 puffs into the lungs every  6 (six) hours as needed. For wheezing      amLODipine 10 MG tablet   Commonly known as: NORVASC   Take 1 tablet (10 mg total) by mouth daily.      aspirin EC 81 MG tablet   Take 81 mg by mouth daily.      atorvastatin 40 MG tablet   Commonly known as: LIPITOR   Take 40 mg by mouth daily.      clopidogrel 75 MG tablet   Commonly known as: PLAVIX   Take 75 mg by mouth daily.      gabapentin 100 MG capsule   Commonly known as: NEURONTIN   Take 100 mg by mouth 3 (three) times daily.      hydrochlorothiazide 25 MG tablet   Commonly known as: HYDRODIURIL   Take 25 mg by mouth daily.      iron  polysaccharides 150 MG capsule   Commonly known as: NIFEREX   Take 150 mg by mouth 2 (two) times daily.      metoprolol succinate 50 MG 24 hr tablet   Commonly known as: TOPROL-XL   Take 50 mg by mouth daily. Take with or immediately following a meal.      polyethylene glycol packet   Commonly known as: MIRALAX / GLYCOLAX   Take 17 g by mouth 2 (two) times daily.      sertraline 50 MG tablet   Commonly known as: ZOLOFT   Take 50 mg by mouth daily.      traMADol 50 MG tablet   Commonly known as: ULTRAM   Take 50 mg by mouth every 6 (six) hours as needed. For pain      traZODone 50 MG tablet   Commonly known as: DESYREL   Take 50 mg by mouth at bedtime.        Issues for Follow Up:  - Follow up medication compliance as this was a significant issue prior to admission - F/u HTN  Outstanding Results: *None Discharge Instructions: Please refer to Patient Instructions section of EMR for full details.  Patient was counseled important signs and symptoms that should prompt return to medical care, changes in medications, dietary instructions, activity restrictions, and follow up appointments.  Follow-up Information    Follow up with HAIRFORD, AMBER, MD. In 2 days. (For appointment in 1-2 weeks)    Contact information:   [address] [place] [address] [phone]          Discharge Condition: Lindsey Sax, MD 07/12/2012, 12:15 PM

## 2012-07-09 NOTE — Progress Notes (Signed)
Family Medicine Teaching Service Daily Progress Note Service Page: [phone]  Patient Assessment: 52 y.o. year old female with PMH of multiple strokes and TIAs HTN, and medical non-compliance, presenting with weakness and leg pain after being found stranded on a toilet all night.  Subjective:  Weakness in her L hand is the same. Complained of some leg pain after walking down the hallway yesterday that is revolved this am.  Her LE edema has improved. Denies any new weakness.States that she will not go to a nursing home and that if her daughter refuses then she will go to her son's house.   Objective: Temp:  [98 F (36.7 C)-98.9 F (37.2 C)] 98 F (36.7 C) (01/04 0500) Pulse Rate:  [62-78] 62  (01/04 0500) Resp:  [16-19] 19  (01/04 0500) BP: (159-185)/(84-105) 185/84 mmHg (01/04 0500) SpO2:  [99 %-100 %] 100 % (01/04 0500) Weight:  [195 lb 15.8 oz (88.9 kg)] 195 lb 15.8 oz (88.9 kg) (01/04 0500) Exam: Gen: NAD, alert, cooperative with exam, sits HEENT: NCAT, EOMI, PERRL, MMM  CV: mild tachycardia, regular rhythm, good S1/S2, no murmur  Resp: CTABL, no wheezes, non-labored  Abd: SNTND, BS present, no guarding or organomegaly  Ext: 1+ edema on BL LE Neuro: Alert and oriented, strength 5/5 in BL LE, Strength 4/5 in LUE, 5/5 in RUE, Sensation intact throughout. Face symmetric, speech clear. CN2-12 intact  I have reviewed the patient's medications, labs, imaging, and diagnostic testing.  Notable results are summarized below.  CBC BMET   Lab 07/09/12 0500 07/08/12 0630 07/07/12 0455  WBC 8.2 8.9 9.2  HGB 9.6* 8.8* 9.2*  HCT 27.3* 25.3* 26.2*  PLT 249 235 245    Lab 07/08/12 0630 07/07/12 0455 07/06/12 0642  NA 137 140 135  K 3.4* 3.5 3.9  CL 107 109 102  CO2 22 21 19   BUN 11 15 20   CREATININE 1.06 1.15* 1.08  GLUCOSE 82 84 118*  CALCIUM 8.5 8.5 9.4     Scheduled Meds:   . aspirin EC  81 mg Oral Daily  . atorvastatin  40 mg Oral QPC supper  . clopidogrel  75 mg Oral Q  breakfast  . gabapentin  100 mg Oral TID  . hydrochlorothiazide  25 mg Oral Daily  . iron polysaccharides  150 mg Oral BID  . metoprolol succinate  25 mg Oral Daily  . polyethylene glycol  17 g Oral BID  . sertraline  50 mg Oral Daily  . traZODone  50 mg Oral QHS   Continuous Infusions:   . sodium chloride Stopped (07/08/12 1233)   PRN Meds:.acetaminophen, acetaminophen, albuterol, ondansetron, traMADol   Imaging/Diagnostic Tests:  CXR 07/06/12  IMPRESSION:  Cardiomegaly without pulmonary edema.   CT Head w/o 07/06/2012  IMPRESSION:  No acute abnormalities. Numerous old strokes  MRI Brain W/W/o 07/07/2012 Extensive chronic microvascular ischemic changes. No acute  infarct.   Plan: Lindsey [name] is a 52 y.o. year old female with PMH of multiple strokes and TIAs HTN, and medical non-compliance, presenting with weakness and leg pain after being found stranded on a toilet all night. With resolution of some of her symptoms it's likely that she had a TIA.   Rhabdomyolysis  - Caused by being stranded on toilet all night, doesn't appear to have affected her kidneys too severely, will monitor.  - UA with Large blood and 0-2 RBCs  - CK 2469 ->  1381,  - Q12 BMP  - KVO fluids - BMP pending this  am, cre has been stable throughout admission.   Lower extremity edema and pain, edema improved - Likely dependent edema as it is new onset after being stranded lll night, likely muscle spasm from same position overnight.  - BL LE venous duplex does not show any evidence of DVT  - Monitor, elevate legs, Ted hose  Recurrent Stroke  - PMH of multiple strokes, non-compliance with medications  - On aspirin and plavix, and 40 mg of lipitor- continue  - Recent MRI 06/15/12. - perhaps some mild weakening in L hand from previous exam,  - MRI from 05/07/13 shows old infarcts, no new strokes.  - prior carotid doppler From 05/20/2012 shows no significant carotid artery stenosis, defer new study for now    - LDL 90 in 11/13, A1c 4.9 on 05/19/2012  - Monitor with serial neuro exams.  - PT/OT consult = home health PT, No OT f/u, 24 hour supervision  Diastolic CHF  - Stable  - Echo 05/20/2012 shows EF 60-65% and Grade 1 diastolic dysf   HTN  - Elevated, did tolerate higher pressure sdue to concern for stroke, will restart amlodipine and metoprolol at her normal dose.  - will tolerate higher pressures with TIA  - hold HCTZ, amlodipine at admission.   -add back HCTZ today - Metoprolol XL at 1/2 home dose- 25 mg daily   Protein calorie malnutrition- likely given several recent hospitalizations  - possibly contributing to LE edema and weakness  - nutrition consult   HLD  - Continue lipitor, LDL 90 in 11/13    FEN/GI: RN Stroke swallow eval, then heart healthy diet, NS at 150 ml/hr  Prophylaxis: SCDs, lovenox after r/o hemmorhage  Disposition: Tele, 24 hour assitance at home- daughter refuses, will check with her son to see if he can accept her. She refuses any SNF stay.     Kevin Fenton, MD 07/09/2012, 9:58 AM

## 2012-07-09 NOTE — Progress Notes (Signed)
I examined this patient and discussed the care plan with Dr Ermalinda Memos and the [facility] team and agree with assessment and plan as documented in the progress note above. I recommend checking a Vitamin D level due to her generalized weakness. On balance and gait testing, she is able to stand without using her arms, but had to walk. Romberg is mildly unstable. Gait is antalgic on the left and she localizes the pain to the thigh area. range of motion of the hips and knees is normal. She has been using a walker at home, but doesn't have a 3 in 1 bedside commode or a shower chair. She needs a raised toilet seat. Ideally PT/OT will evaluate her in the whichever child's home that she goes to in order to determine needed medical equipment.

## 2012-07-10 LAB — TSH: TSH: 0.949 u[IU]/mL (ref 0.350–4.500)

## 2012-07-10 NOTE — Clinical Social Work Psychosocial (Signed)
Clinical Social Work Department BRIEF PSYCHOSOCIAL ASSESSMENT 07/10/2012  Patient:  Lindsey [name], Lindsey [name]     Account Number:  [id number]     Admit date:  07/06/2012  Clinical Social Worker:  Johnsie Cancel  Date/Time:  07/10/2012 12:23 PM  Referred by:  Physician  Date Referred:  07/10/2012 Referred for  SNF Placement   Other Referral:   Interview type:  Patient Other interview type:    PSYCHOSOCIAL DATA Living Status:  ALONE Primary support name:  Harriett Sine [phone]) Primary support relationship to patient:  CHILD, ADULT Degree of support available:   Unable to assess, child not present at bedside.    CURRENT CONCERNS Current Concerns  Post-Acute Placement   Other Concerns:    SOCIAL WORK ASSESSMENT / PLAN CSW consulted by MD re: SNF placement. MD informed this provider family is refusing to care for the patient at discharge. Patient was discharged yesterday, and awaiting placement. CSW met with patient who stated she was upset by her family being unable to provide supervision. Patient is agreeable to SNF. CSW provided support and education on SNF process. CSW will fax patient out, and provide bed offers tomorrow, 07/10/12.   Assessment/plan status:  Information/Referral to [facility] Other assessment/ plan:   Information/referral to community resources:    PATIENT'S/FAMILY'S RESPONSE TO PLAN OF CARE: Patient thanked CSW for support and facilitating SNF process.    Lia Foyer, LCSWA [facility] Clinical Social Worker Contact #: [phone] (weekend)

## 2012-07-10 NOTE — Progress Notes (Signed)
I examined this patient and discussed the care plan with Ritch and the FPTS team and agree with assessment and plan as documented in the progress note above. She doesn't want to go to [place], where her daughter works, for rehab. She is drowsy this AM which she attributes to Trazodone. Consider stopping that and increasing her SSRI.

## 2012-07-10 NOTE — Clinical Social Work Placement (Addendum)
Clinical Social Work Department CLINICAL SOCIAL WORK PLACEMENT NOTE 07/10/2012  Patient:  JAIRY, ANGULO  Account Number:  [id number] Admit date:  07/06/2012  Clinical Social Worker:  Johnsie Cancel  Date/time:  07/10/2012 12:20 PM  Clinical Social Work is seeking post-discharge placement for this patient at the following level of care:   SKILLED NURSING   (*CSW will update this form in Epic as items are completed)   07/10/2012  Patient/family provided with [name] Department of Clinical Social Work's list of facilities offering this level of care within the geographic area requested by the patient (or if unable, by the patient's family).  07/10/2012  Patient/family informed of their freedom to choose among providers that offer the needed level of care, that participate in Medicare, Medicaid or managed care program needed by the patient, have an available bed and are willing to accept the patient.  07/10/2012  Patient/family informed of [facility]' ownership interest in [facility], as well as of the fact that they are under no obligation to receive care at this facility.  PASARR submitted to EDS on 07/12/12 PASARR number received from EDS on 01/03/13  FL2 transmitted to all facilities in geographic area requested by pt/family on  07/10/2012 FL2 transmitted to all facilities within larger geographic area on   Patient informed that his/her managed care company has contracts with or will negotiate with  certain facilities, including the following:     Patient/family informed of bed offers received:  07/11/12 Patient chooses bed at [facility] Physician recommends and patient chooses bed at    Patient to be transferred to [facility]  on  07/12/12 Patient to be transferred to facility by Ambulance  Sharin Mons)  The following physician request were entered in Epic:   Additional Comments:  Lia Foyer, LCSWA [facility] Clinical Social Worker Contact #: [phone] (weekend)  07/12/12  Ok per MD for d/c today. Patient and daughter are pleased with d/c plan. Notified SNF and pt's nurse of d/c. No further CSW needs identified.  Lorri Frederick. [place]  [phone]

## 2012-07-10 NOTE — Progress Notes (Signed)
Family Medicine Teaching Service Daily Progress Note Service Page: [phone]  Patient Assessment: 52 y.o. year old female with PMH of multiple strokes and TIAs HTN, and medical non-compliance, presenting with weakness and leg pain after being found stranded on a toilet all night.  Subjective:  As of last night, all family members have decided they are unable to give 24 hour supervision.  No other acute events   Objective: Temp:  [97 F (36.1 C)-98.9 F (37.2 C)] 97.9 F (36.6 C) (01/05 0921) Pulse Rate:  [68-75] 73  (01/05 0921) Resp:  [16-18] 16  (01/05 0921) BP: (128-171)/(75-107) 128/75 mmHg (01/05 0921) SpO2:  [96 %-100 %] 98 % (01/05 0921) Weight:  [194 lb 12.8 oz (88.361 kg)] 194 lb 12.8 oz (88.361 kg) (01/05 0548) Exam: Gen: NAD, alert, cooperative with exam, sits CV: RRR, good S1/S2, no murmur  Resp: CTABL, no wheezes, non-labored  Abd: SNTND, BS present Ext: trace edema on BL LE  I have reviewed the patient's medications, labs, imaging, and diagnostic testing.  Notable results are summarized below.  CBC BMET   Lab 07/09/12 0500 07/08/12 0630 07/07/12 0455  WBC 8.2 8.9 9.2  HGB 9.6* 8.8* 9.2*  HCT 27.3* 25.3* 26.2*  PLT 249 235 245    Lab 07/09/12 0917 07/08/12 0630 07/07/12 0455  NA 141 137 140  K 3.4* 3.4* 3.5  CL 105 107 109  CO2 27 22 21   BUN 10 11 15   CREATININE 1.11* 1.06 1.15*  GLUCOSE 132* 82 84  CALCIUM 9.0 8.5 8.5     Imaging/Diagnostic Tests:  MRI Brain W/W/o 07/07/2012 Extensive chronic microvascular ischemic changes. No acute infarct.  Plan: Lindsey [name] is a 52 y.o. year old female with PMH of multiple strokes and TIAs HTN, and medical non-compliance, presenting with weakness and leg pain after being found stranded on a toilet all night. With resolution of some of her symptoms it's likely that she had a TIA.   Rhabdomyolysis  - Resolved   Lower extremity edema and pain, edema improved - improving - Monitor, elevate legs, Ted  hose  Recurrent Stroke  - No new strokes on MRI - On aspirin and plavix, and 40 mg of lipitor- continue  - PT/OT consult = home health PT, No OT f/u, 24 hour supervision.  Family unable to give 24 hour supervision  Diastolic CHF  - Stable  - Echo 05/20/2012 shows EF 60-65% and Grade 1 diastolic dysf   HTN  - On home doses of HCTZ and toprol.  Not on home dose of amlodipine.  Currently normotensive  Protein calorie malnutrition- likely given several recent hospitalizations  - possibly contributing to LE edema and weakness  - nutrition consult   HLD  - Continue lipitor, LDL 90 in 11/13   FEN/GI: RN Stroke swallow eval, then heart healthy diet, NS at 150 ml/hr  Prophylaxis: SCDs, lovenox after r/o hemmorhage  Disposition: Will consult social work for help with placement for short term rehab  Klever Twyford, MD 07/10/2012, 9:52 AM

## 2012-07-11 LAB — CBC
HCT: 28.5 % — ABNORMAL LOW (ref 36.0–46.0)
Hemoglobin: 10 g/dL — ABNORMAL LOW (ref 12.0–15.0)
RBC: 3.47 MIL/uL — ABNORMAL LOW (ref 3.87–5.11)
WBC: 8.6 10*3/uL (ref 4.0–10.5)

## 2012-07-11 MED ORDER — AMLODIPINE BESYLATE 10 MG PO TABS
10.0000 mg | ORAL_TABLET | Freq: Every day | ORAL | Status: DC
  Administered 2012-07-11 – 2012-07-12 (×2): 10 mg via ORAL
  Filled 2012-07-11 (×2): qty 1

## 2012-07-11 NOTE — Progress Notes (Signed)
Family Medicine Teaching Service Daily Progress Note Service Page: [phone]  Patient Assessment: 52 y.o. year old female with PMH of multiple strokes and TIAs HTN, and medical non-compliance, presenting with weakness and leg pain after being found stranded on a toilet all night.  Subjective:  No new complaints, leg pain improving, LE edema improved. Awaiting SNF placement.   Objective: Temp:  [97.2 F (36.2 C)-98.7 F (37.1 C)] 97.6 F (36.4 C) (01/06 1354) Pulse Rate:  [62-70] 62  (01/06 1354) Resp:  [18-20] 18  (01/06 1354) BP: (144-151)/(73-91) 145/73 mmHg (01/06 1354) SpO2:  [99 %-100 %] 100 % (01/06 1354) Weight:  [191 lb 12.8 oz (87 kg)] 191 lb 12.8 oz (87 kg) (01/06 0540) Exam: Gen: NAD, alert, cooperative with exam, sits CV: RRR, good S1/S2, no murmur  Resp: CTABL, no wheezes, non-labored  Abd: SNTND, BS present Ext: trace edema on BL LE  I have reviewed the patient's medications, labs, imaging, and diagnostic testing.  Notable results are summarized below.  CBC BMET   Lab 07/11/12 1052 07/09/12 0500 07/08/12 0630  WBC 8.6 8.2 8.9  HGB 10.0* 9.6* 8.8*  HCT 28.5* 27.3* 25.3*  PLT 282 249 235    Lab 07/09/12 0917 07/08/12 0630 07/07/12 0455  NA 141 137 140  K 3.4* 3.4* 3.5  CL 105 107 109  CO2 27 22 21   BUN 10 11 15   CREATININE 1.11* 1.06 1.15*  GLUCOSE 132* 82 84  CALCIUM 9.0 8.5 8.5     Imaging/Diagnostic Tests:  MRI Brain W/W/o 07/07/2012 Extensive chronic microvascular ischemic changes. No acute infarct.  Plan: Lindsey [name] is a 52 y.o. year old female with PMH of multiple strokes and TIAs HTN, and medical non-compliance, presenting with weakness and leg pain after being found stranded on a toilet all night. With resolution of some of her symptoms it's likely that she had a TIA.   Rhabdomyolysis  - Resolved   Lower extremity edema and pain, edema - improved - improving - Monitor, elevate legs, Ted hose  Recurrent Stroke  - No new strokes on  MRI - On aspirin and plavix, and 40 mg of lipitor  - DC ASA, continue plavix and lipitor - PT/OT consult = home health PT, No OT f/u, 24 hour supervision.    - Family unable to give 24 hour supervision, seeking SNF placement  Diastolic CHF  - Stable  - Echo 05/20/2012 shows EF 60-65% and Grade 1 diastolic dysf   HTN  - Still not at goal - On home doses of HCTZ and toprol.  Not on home dose of amlodipine- restart  Protein calorie malnutrition- likely given several recent hospitalizations  - possibly contributing to LE edema and weakness  - nutrition consult   HLD  - Continue lipitor, LDL 90 in 11/13   FEN/GI: RN Stroke swallow eval, then heart healthy diet, NS at 150 ml/hr  Prophylaxis: SCDs, lovenox after r/o hemmorhage  Disposition: Will consult social work for help with placement for short term rehab  Kevin Fenton, MD 07/11/2012, 1:55 PM

## 2012-07-11 NOTE — Progress Notes (Signed)
Physical Therapy Treatment Patient Details Name: Lindsey [name] MRN: [id number] DOB: 1961-06-19 Today's Date: 07/11/2012 Time: 4098-1191 PT Time Calculation (min): 17 min  PT Assessment / Plan / Recommendation Comments on Treatment Session  Patient very motivated to progress and regain full independence. She has decided to pursue SNF as she does not have assistance at home and does not plan on staying with her children as they all work and patient is fearful of being without assistance. Patient was only home one day without assistance prior to readmission to hospital and is concerned this will happen again    Follow Up Recommendations  SNF;Other (comment) (for 24/7 supervision)     Does the patient have the potential to tolerate intense rehabilitation     Barriers to Discharge        Equipment Recommendations  None recommended by PT    Recommendations for Other Services    Frequency Min 3X/week   Plan Discharge plan needs to be updated;Frequency remains appropriate    Precautions / Restrictions Precautions Precautions: Fall Restrictions Weight Bearing Restrictions: No   Pertinent Vitals/Pain 8/10 constant L leg pain    Mobility  Bed Mobility Bed Mobility: Not assessed Transfers Sit to Stand: 6: Modified independent (Device/Increase time) Stand to Sit: 6: Modified independent (Device/Increase time) Ambulation/Gait Ambulation/Gait Assistance: 5: Supervision Ambulation Distance (Feet): 250 Feet Assistive device: Rolling walker Ambulation/Gait Assistance Details: Cues for safe management of RW and not to pick up with turning. Patient continues with left foot drag when fatiquing. Patient cues to increase knee flexion for L foot to clear floor Gait Pattern: Step-through pattern;Decreased stride length    Exercises     PT Diagnosis:    PT Problem List:   PT Treatment Interventions:     PT Goals Acute Rehab PT Goals PT Goal: Sit to Stand - Progress: Met PT Goal: Stand to  Sit - Progress: Met PT Goal: Ambulate - Progress: Progressing toward goal  Visit Information  Last PT Received On: 07/11/12 Assistance Needed: +1    Subjective Data      Cognition  Overall Cognitive Status: Appears within functional limits for tasks assessed/performed Arousal/Alertness: Awake/alert Orientation Level: Appears intact for tasks assessed Behavior During Session: [facility] for tasks performed    Balance     End of Session PT - End of Session Equipment Utilized During Treatment: Gait belt Activity Tolerance: Patient tolerated treatment well Patient left: in chair;with call bell/phone within reach   GP     Fredrich Birks 07/11/2012, 2:44 PM

## 2012-07-11 NOTE — Progress Notes (Signed)
Followed up with patient at bedside.  Patient is comfortable with her disposition change to short term rehabilitation.  She would like to be able to function independently at home rather than discharge to her daughter's home.  She does understand and accepts that even after short term SNF she may need to live with her daughter based on her level of recovery.  Informed patient that we will engage her care following her SNF course of care. Of note, [facility] Care Management services does not replace or interfere with any services that are arranged by inpatient case management or social work.  For additional questions or referrals please contact Anibal Henderson BSN RN [facility] Liaison at [phone].

## 2012-07-11 NOTE — Progress Notes (Signed)
FMTS Attending Note Patient seen and examined by me, discussed with resident team and I agree with Dr Felipa Emory assess/plan for today.  Patient is unable to perform her own ADLs in the short-term, and lacks 24/7 family support.  She voices agreement with the idea of short-term SNF with PT/OT to regain strength and thus return home.  Secondary stroke prevention discussed, as per Dr Felipa Emory note.  Paula Compton, MD

## 2012-07-11 NOTE — Progress Notes (Signed)
Occupational Therapy Treatment Patient Details Name: Lindsey [name] MRN: [id number] DOB: 04-29-1961 Today's Date: 07/11/2012 Time: 4098-1191 OT Time Calculation (min): 25 min  OT Assessment / Plan / Recommendation Comments on Treatment Session This 52 yo female making progress in some areas. Feel she will continue to progress and will only need short term rehab to be able to get back to an I/Mod I level.    Follow Up Recommendations  SNF       Equipment Recommendations  3 in 1 bedside comode       Frequency Min 2X/week   Plan Discharge plan needs to be updated    Precautions / Restrictions Precautions Precautions: Fall Restrictions Weight Bearing Restrictions: No       ADL  Grooming: Performed;Wash/dry hands;Wash/dry face;Min guard Where Assessed - Grooming: Unsupported standing Lower Body Dressing: Performed;+1 Total assistance (for socks) Where Assessed - Lower Body Dressing: Unsupported sitting Toilet Transfer: Performed;Min guard Toilet Transfer Method: Sit to Barista: Grab bars;Regular height toilet Toileting - Clothing Manipulation and Hygiene: Performed;Independent Where Assessed - Toileting Clothing Manipulation and Hygiene: Sit to stand from 3-in-1 or toilet Equipment Used: Rolling walker Transfers/Ambulation Related to ADLs: min guard A for all ADL Comments: Could not cross one leg over the other to get to feet, nor could she safely bend foreward while seated in recliner to get her socks off.      Visit Information  Last OT Received On: 07/11/12 Assistance Needed: +1    Subjective Data  Subjective: My legs are so much better Patient Stated Goal: Go to rehab short term      Cognition  Overall Cognitive Status: Appears within functional limits for tasks assessed/performed Arousal/Alertness: Awake/alert Orientation Level: Appears intact for tasks assessed Behavior During Session: [facility] for tasks performed    Mobility   Bed  Mobility Bed Mobility: Supine to Sit Supine to Sit: 5: Supervision;HOB flat;With rails Transfers Transfers: Sit to Stand;Stand to Sit Sit to Stand: 4: Min guard;With upper extremity assist;From bed Stand to Sit: 4: Min guard;With upper extremity assist;With armrests;To chair/3-in-1             End of Session OT - End of Session Activity Tolerance: Patient tolerated treatment well Patient left: in chair;with call bell/phone within reach       Evette Georges [phone] 07/11/2012, 2:50 PM

## 2012-07-12 ENCOUNTER — Ambulatory Visit: Payer: Medicare Other | Attending: Family Medicine | Admitting: Rehabilitative and Restorative Service Providers"

## 2012-07-12 NOTE — Progress Notes (Signed)
Agree with PTA.    Derya Dettmann, PT [phone]  
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
## 2012-07-12 NOTE — Discharge Summary (Signed)
FMTS Attending Note Patient seen and examined by me on day of discharge, I agree with plan as per Dr Felipa Emory progress note.  Paula Compton, MD

## 2012-07-12 NOTE — Progress Notes (Signed)
Lindsey [name] to be D/C'd Nursing Home per MD order.  Discussed with the patient and all questions fully answered.   Lindsey [name], Lindsey [name]  Home Medication Instructions ZOX:[id number]   Printed on:07/12/12 2033  Medication Information                    clopidogrel (PLAVIX) 75 MG tablet Take 75 mg by mouth daily.           atorvastatin (LIPITOR) 40 MG tablet Take 40 mg by mouth daily.           sertraline (ZOLOFT) 50 MG tablet Take 50 mg by mouth daily.           traMADol (ULTRAM) 50 MG tablet Take 50 mg by mouth every 6 (six) hours as needed. For pain           traZODone (DESYREL) 50 MG tablet Take 50 mg by mouth at bedtime.           hydrochlorothiazide (HYDRODIURIL) 25 MG tablet Take 25 mg by mouth daily.           aspirin EC 81 MG tablet Take 81 mg by mouth daily.           gabapentin (NEURONTIN) 100 MG capsule Take 100 mg by mouth 3 (three) times daily.           metoprolol succinate (TOPROL-XL) 50 MG 24 hr tablet Take 50 mg by mouth daily. Take with or immediately following a meal.           iron polysaccharides (NIFEREX) 150 MG capsule Take 150 mg by mouth 2 (two) times daily.           albuterol (PROVENTIL HFA;VENTOLIN HFA) 108 (90 BASE) MCG/ACT inhaler Inhale 2 puffs into the lungs every 6 (six) hours as needed. For wheezing           polyethylene glycol (MIRALAX / GLYCOLAX) packet Take 17 g by mouth 2 (two) times daily.           amLODipine (NORVASC) 10 MG tablet Take 1 tablet (10 mg total) by mouth daily.             VVS, Skin clean, dry and intact without evidence of skin break down, no evidence of skin tears noted. IV catheter discontinued intact. Site without signs and symptoms of complications. Dressing and pressure applied.  Patient escorted via stretcher, and D/C SNF via ambulance/EMT for rehab.  Milta Deiters 07/12/2012 8:33 PM

## 2012-07-12 NOTE — Progress Notes (Signed)
FMTS Attending Note Patient seen and examined by me, I have reviewed Dr Felipa Emory note and agree with his assessment and plan. Patient is medically stable for discharge from hospital pending bed assignment at SNF with PT/OT. Paula Compton, MD

## 2012-07-12 NOTE — Progress Notes (Signed)
Report given to Nurse at [facility]. EMT are on their way. Will continue to monitor until they arrive to pick up patient.

## 2012-07-26 ENCOUNTER — Other Ambulatory Visit: Payer: Self-pay | Admitting: Family Medicine

## 2012-07-26 MED ORDER — SIMVASTATIN 40 MG PO TABS: 40.0000 mg | ORAL_TABLET | Freq: Every day | ORAL | Status: DC

## 2012-08-04 ENCOUNTER — Ambulatory Visit: Payer: Medicare Other | Admitting: Family Medicine

## 2012-08-08 ENCOUNTER — Emergency Department (INDEPENDENT_AMBULATORY_CARE_PROVIDER_SITE_OTHER)
Admission: EM | Admit: 2012-08-08 | Discharge: 2012-08-08 | Disposition: A | Discharge status: Home / Self Care | Payer: Medicare Other | Source: Home / Self Care | Attending: Family Medicine | Admitting: Family Medicine

## 2012-08-08 ENCOUNTER — Encounter (HOSPITAL_COMMUNITY): Payer: Self-pay

## 2012-08-08 DIAGNOSIS — T24209A Burn of second degree of unspecified site of unspecified lower limb, except ankle and foot, initial encounter: Secondary | ICD-10-CM

## 2012-08-08 DIAGNOSIS — Z23 Encounter for immunization: Secondary | ICD-10-CM

## 2012-08-08 MED ORDER — SILVER SULFADIAZINE 1 % EX CREA: TOPICAL_CREAM | Freq: Three times a day (TID) | CUTANEOUS | Status: DC | PRN

## 2012-08-08 MED ORDER — TETANUS-DIPHTH-ACELL PERTUSSIS 5-2.5-18.5 LF-MCG/0.5 IM SUSP
INTRAMUSCULAR | Status: AC
  Filled 2012-08-08: qty 0.5

## 2012-08-08 MED ORDER — TETANUS-DIPHTH-ACELL PERTUSSIS 5-2.5-18.5 LF-MCG/0.5 IM SUSP
0.5000 mL | Freq: Once | INTRAMUSCULAR | Status: AC
  Administered 2012-08-08: 0.5 mL via INTRAMUSCULAR
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
## 2012-08-11 ENCOUNTER — Encounter: Payer: Self-pay | Admitting: Family Medicine

## 2012-08-11 ENCOUNTER — Ambulatory Visit (INDEPENDENT_AMBULATORY_CARE_PROVIDER_SITE_OTHER): Payer: Medicare Other | Admitting: Family Medicine

## 2012-08-11 VITALS — BP 131/79 | HR 86 | Temp 97.5°F | Ht 59.0 in | Wt 192.0 lb

## 2012-08-11 DIAGNOSIS — R609 Edema, unspecified: Secondary | ICD-10-CM

## 2012-08-11 DIAGNOSIS — M7989 Other specified soft tissue disorders: Secondary | ICD-10-CM

## 2012-08-11 DIAGNOSIS — I6350 Cerebral infarction due to unspecified occlusion or stenosis of unspecified cerebral artery: Secondary | ICD-10-CM

## 2012-08-11 DIAGNOSIS — R600 Localized edema: Secondary | ICD-10-CM

## 2012-08-11 MED ORDER — FUROSEMIDE 20 MG PO TABS
20.0000 mg | ORAL_TABLET | Freq: Once | ORAL | Status: AC
  Administered 2012-08-11: 20 mg via ORAL

## 2012-08-11 NOTE — Assessment & Plan Note (Signed)
Not sure she is fully capable of independent living.  Discussed options including applying for Medicaid, (states she does not qualify), moving in with a kid, moving into assisted living, etc.  Spoke with SW who will put in [facility] referral and call her in the am.
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
## 2012-08-11 NOTE — Assessment & Plan Note (Signed)
Unclear etiology, but doubt acute CVA.  Given po Lasix x 1 in clinic in hopes of decreasing it.

## 2012-08-11 NOTE — Patient Instructions (Signed)
Hypertension As your heart beats, it forces blood through your arteries. This force is your blood pressure. If the pressure is too high, it is called hypertension (HTN) or high blood pressure. HTN is dangerous because you may have it and not know it. High blood pressure may mean that your heart has to work harder to pump blood. Your arteries may be narrow or stiff. The extra work puts you at risk for heart disease, stroke, and other problems.  Blood pressure consists of two numbers, a higher number over a lower, 110/72, for example. It is stated as "110 over 72." The ideal is below 120 for the top number (systolic) and under 80 for the bottom (diastolic). Write down your blood pressure today. You should pay close attention to your blood pressure if you have certain conditions such as:  Heart failure.  Prior heart attack.  Diabetes  Chronic kidney disease.  Prior stroke.  Multiple risk factors for heart disease. To see if you have HTN, your blood pressure should be measured while you are seated with your arm held at the level of the heart. It should be measured at least twice. A one-time elevated blood pressure reading (especially in the Emergency Department) does not mean that you need treatment. There may be conditions in which the blood pressure is different between your right and left arms. It is important to see your caregiver soon for a recheck. Most people have essential hypertension which means that there is not a specific cause. This type of high blood pressure may be lowered by changing lifestyle factors such as:  Stress.  Smoking.  Lack of exercise.  Excessive weight.  Drug/tobacco/alcohol use.  Eating less salt. Most people do not have symptoms from high blood pressure until it has caused damage to the body. Effective treatment can often prevent, delay or reduce that damage. TREATMENT  When a cause has been identified, treatment for high blood pressure is directed at the  cause. There are a large number of medications to treat HTN. These fall into several categories, and your caregiver will help you select the medicines that are best for you. Medications may have side effects. You should review side effects with your caregiver. If your blood pressure stays high after you have made lifestyle changes or started on medicines,   Your medication(s) may need to be changed.  Other problems may need to be addressed.  Be certain you understand your prescriptions, and know how and when to take your medicine.  Be sure to follow up with your caregiver within the time frame advised (usually within two weeks) to have your blood pressure rechecked and to review your medications.  If you are taking more than one medicine to lower your blood pressure, make sure you know how and at what times they should be taken. Taking two medicines at the same time can result in blood pressure that is too low. SEEK IMMEDIATE MEDICAL CARE IF:  You develop a severe headache, blurred or changing vision, or confusion.  You have unusual weakness or numbness, or a faint feeling.  You have severe chest or abdominal pain, vomiting, or breathing problems. MAKE SURE YOU:   Understand these instructions.  Will watch your condition.  Will get help right away if you are not doing well or get worse. Document Released: 06/22/2005 Document Revised: 09/14/2011 Document Reviewed: 02/10/2008 [facility] Patient Information 2013 [place], [place]. Stroke (Cerebrovascular Accident) A stroke (cerebrovascular accident, CVA) means you have a brain injury from blocked  circulation or bleeding in the brain. Blocked circulation usually comes from a clot. RISK FACTORS  High blood pressure (hypertension).   High cholesterol.   Diabetes.   Heart disease.   The buildup of fatty deposits in the blood vessels (peripheral artery disease or atherosclerosis).   An abnormal heart rhythm (atrial fibrillation).    Obesity.   Smoking.   Taking oral contraceptives (especially in combination with smoking).   Physical inactivity.   A diet high in fats, salt (sodium), and calories.   Alcohol use.   Use of illegal drugs (especially cocaine and methamphetamine).   Being a female.   Being an Tree surgeon.   Age over 74.   Family history of stroke.   Previous history of blood clots, a "warning stroke" (transient ischemic attack, TIA), or heart attack.   Sickle cell disease.  SYMPTOMS  The symptoms of a stroke depend on the part of the brain that is affected. It is important to seek treatment within 4 hours of the start of symptoms because you may receive a "clot dissolving" medication that cannot be given after that time. Even if you don't know when your symptoms began, get treatment as soon as possible. Symptoms of a stroke may progress or change over the first several days. Symptoms may include:  Sudden weakness or numbness of the face, arm, or leg, especially on one side of the body.   Sudden confusion.   Trouble speaking (aphasia) or understanding.   Sudden trouble seeing in one or both eyes.   Sudden trouble walking.   Dizziness.   Loss of balance or coordination.   Sudden severe headache with no known cause.  HOME CARE INSTRUCTIONS   Medicines: Aspirin and blood thinners may be used to prevent another stroke. Blood thinners need to be used exactly as instructed. Medicines may also be used to control risk factors for a stroke. Be sure you understand all your medicine instructions.   Diet: Certain diets may be prescribed to address high blood pressure, high cholesterol, diabetes, or obesity. A diet that includes 5 or more servings of fruits and vegetables a day may reduce the risk of stroke. Foods may need to be a special consistency (soft or pureed), or small bites may need to be taken in order to avoid aspirating or choking.   Maintain a healthy weight.   Stay physically  active. It is recommended that you get at least 30 minutes of activity on most or all days.   Do not smoke.   Limit alcohol use.   Stop drug abuse.   Home safety: A safe home environment is important to reduce the risk of falls. Your caregiver may arrange for specialists to evaluate your home. Having grab bars in the bedroom and bathroom is often important. Your caregiver may arrange for special equipment to be used at home, such as raised toilets and a seat for the shower.   Physical, occupational, and speech therapy: Ongoing therapy may be needed to maximize your recovery after a stroke. If you have been advised to use a walker or a cane, use it at all times. Be sure to keep your therapy appointments.   Follow all instructions for follow-up with your caregiver. This is VERY important. This includes any referrals, physical therapy, rehabilitation, and laboratory tests. Proper treatment also prevents another stroke from occurring.  SEEK IMMEDIATE MEDICAL CARE IF:   You have sudden weakness or numbness of the face, arm, or leg, especially on one side of  the body.   You have sudden confusion.   You have trouble speaking (aphasia) or understanding.   You have sudden trouble seeing in one or both eyes.   You have sudden trouble walking.   You have dizziness.   You have loss of balance or coordination.   You have a sudden, severe headache with no known cause.   You have a fever.   You are coughing or have difficulty breathing.   You have new chest pain, angina, or an irregular heartbeat.  Any of these symptoms may represent a serious problem that is an emergency. Do not wait to see if the symptoms will go away. Get medical help at once. Call your local emergency services (911 in [place] Do not drive yourself to the hospital. Document Released: 06/22/2005 Document Revised: 01/05/2011 Document Reviewed: 11/20/2009 [facility] Patient Information 2012 [place], [place].

## 2012-08-12 ENCOUNTER — Telehealth: Payer: Self-pay | Admitting: *Deleted

## 2012-08-12 NOTE — Telephone Encounter (Signed)
Renee, nurse case manager with [facility] left message on pharmacy/physician line.  Renee noticed in Epic that Lindsey [name] was seen in our clinic yesterday.    States she had an appointment scheduled to go out and see Lindsey [name] on 08/03/2012.  She had called ahead of time and made the appointment but when she got there Lindsey [name] was not at home.  Lindsey [name] has tried reaching her at home and by phone.  She has left messages but has not been able to get in contact with her.  Renee states if we have any other means of getting in touch with Lindsey [name], she would be happy to be involved in the patient's care.  Will forward to Dr. Mikel Cella to review.    Lindsey [name] can be reached at [phone].

## 2012-08-12 NOTE — Telephone Encounter (Signed)
Pt was seen by Dr. Shawnie Pons for that visit. I know she was recently d/c from SNF so that could be where appt was arranged with Renee. I have not seen Ms. Golliday since 06/08/12. I am not sure if she is staying at a different residence right now given her weakness s/p CVA.  The only number we have is [phone].  Please let me know if I can do anything else to help.

## 2012-08-23 ENCOUNTER — Ambulatory Visit: Payer: Medicare Other | Admitting: Family Medicine

## 2012-08-28 ENCOUNTER — Observation Stay (HOSPITAL_COMMUNITY)
Admission: EM | Admit: 2012-08-28 | Discharge: 2012-08-30 | Disposition: A | Discharge status: Home / Self Care | Payer: Medicare Other | Attending: Family Medicine | Admitting: Family Medicine

## 2012-08-28 ENCOUNTER — Emergency Department (HOSPITAL_COMMUNITY): Payer: Medicare Other

## 2012-08-28 ENCOUNTER — Encounter (HOSPITAL_COMMUNITY): Payer: Self-pay | Admitting: Emergency Medicine

## 2012-08-28 DIAGNOSIS — I5030 Unspecified diastolic (congestive) heart failure: Secondary | ICD-10-CM | POA: Insufficient documentation

## 2012-08-28 DIAGNOSIS — I6350 Cerebral infarction due to unspecified occlusion or stenosis of unspecified cerebral artery: Secondary | ICD-10-CM | POA: Diagnosis present

## 2012-08-28 DIAGNOSIS — N189 Chronic kidney disease, unspecified: Secondary | ICD-10-CM | POA: Insufficient documentation

## 2012-08-28 DIAGNOSIS — I10 Essential (primary) hypertension: Secondary | ICD-10-CM | POA: Diagnosis present

## 2012-08-28 DIAGNOSIS — G4730 Sleep apnea, unspecified: Secondary | ICD-10-CM | POA: Insufficient documentation

## 2012-08-28 DIAGNOSIS — G459 Transient cerebral ischemic attack, unspecified: Principal | ICD-10-CM | POA: Insufficient documentation

## 2012-08-28 DIAGNOSIS — G8929 Other chronic pain: Secondary | ICD-10-CM | POA: Insufficient documentation

## 2012-08-28 DIAGNOSIS — G935 Compression of brain: Secondary | ICD-10-CM | POA: Insufficient documentation

## 2012-08-28 DIAGNOSIS — R200 Anesthesia of skin: Secondary | ICD-10-CM | POA: Diagnosis present

## 2012-08-28 DIAGNOSIS — I69959 Hemiplegia and hemiparesis following unspecified cerebrovascular disease affecting unspecified side: Secondary | ICD-10-CM | POA: Insufficient documentation

## 2012-08-28 DIAGNOSIS — E785 Hyperlipidemia, unspecified: Secondary | ICD-10-CM | POA: Insufficient documentation

## 2012-08-28 DIAGNOSIS — R209 Unspecified disturbances of skin sensation: Secondary | ICD-10-CM | POA: Insufficient documentation

## 2012-08-28 DIAGNOSIS — I509 Heart failure, unspecified: Secondary | ICD-10-CM | POA: Insufficient documentation

## 2012-08-28 DIAGNOSIS — K219 Gastro-esophageal reflux disease without esophagitis: Secondary | ICD-10-CM | POA: Insufficient documentation

## 2012-08-28 DIAGNOSIS — I129 Hypertensive chronic kidney disease with stage 1 through stage 4 chronic kidney disease, or unspecified chronic kidney disease: Secondary | ICD-10-CM | POA: Insufficient documentation

## 2012-08-28 DIAGNOSIS — G43909 Migraine, unspecified, not intractable, without status migrainosus: Secondary | ICD-10-CM | POA: Insufficient documentation

## 2012-08-28 LAB — DIFFERENTIAL
Basophils Relative: 0 % (ref 0–1)
Eosinophils Absolute: 0 10*3/uL (ref 0.0–0.7)
Monocytes Absolute: 0.3 10*3/uL (ref 0.1–1.0)
Monocytes Relative: 3 % (ref 3–12)
Neutrophils Relative %: 80 % — ABNORMAL HIGH (ref 43–77)

## 2012-08-28 LAB — PROTIME-INR: INR: 1.02 (ref 0.00–1.49)

## 2012-08-28 LAB — CBC
Hemoglobin: 11.9 g/dL — ABNORMAL LOW (ref 12.0–15.0)
MCH: 28.7 pg (ref 26.0–34.0)
MCHC: 35.1 g/dL (ref 30.0–36.0)

## 2012-08-28 LAB — COMPREHENSIVE METABOLIC PANEL
Albumin: 3.7 g/dL (ref 3.5–5.2)
BUN: 12 mg/dL (ref 6–23)
Creatinine, Ser: 1.19 mg/dL — ABNORMAL HIGH (ref 0.50–1.10)
Potassium: 3.9 mEq/L (ref 3.5–5.1)
Total Protein: 8.2 g/dL (ref 6.0–8.3)

## 2012-08-28 LAB — GLUCOSE, CAPILLARY: Glucose-Capillary: 119 mg/dL — ABNORMAL HIGH (ref 70–99)

## 2012-08-28 LAB — TROPONIN I: Troponin I: <0.3 ng/mL (ref <0.30)

## 2012-08-28 NOTE — ED Notes (Signed)
Not in room. In CT.

## 2012-08-28 NOTE — ED Notes (Signed)
Pt arrived to triage stating that she was having a TIA.  Pt reports R sided facial numbness and R sided weakness since 2pm and states that symptoms have not resolved or gotten any better.  Grips strong and equal, no arm drift, no facial droop, speech clear, sensation intact.  Pt ambulated without difficulty from wheelchair to chair.  No neuro deficits noted.

## 2012-08-28 NOTE — ED Notes (Signed)
Pt alert, NAD, calm, interactive, resps e/u, speaking in clear complete setnences, MAEx4, PERRL, A&Ox4, c/o HA 10/10, mild dizziness, some numbness R side leg arm and face and numbness L side face, also some "new" R hip pain. Denies sob or nausea.

## 2012-08-28 NOTE — ED Provider Notes (Signed)
History     CSN: [id number]  Arrival date & time 08/28/12  2023   First MD Initiated Contact with Patient 08/28/12 2053      Chief Complaint  Patient presents with  . Numbness    (Consider location/radiation/quality/duration/timing/severity/associated sxs/prior treatment) Patient is a 52 y.o. female presenting with neurologic complaint. The history is provided by the patient.  Neurologic Problem The primary symptoms include loss of sensation. Primary symptoms do not include headaches, fever, nausea or vomiting. The symptoms began 12 to 24 hours ago. The episode lasted 18 hours. The symptoms are unchanged. The neurological symptoms are diffuse (L sided).  Loss of sensation began 12 - 24 hours ago. The loss of sensation is unchanged. The deficit is described as loss of sensation to touch and pain. Affected locations include: left forehead, left cheek, left jaw, left neck, left shoulder, left lateral upper arm, left lateral forearm, left thumb, left third digit, left fifth digit, left medial forearm, left medial upper arm, left anterior thigh, left anterior knee, left medial distal leg, left lateral distal leg, left posterior leg and left buttock.  Additional symptoms include weakness (chronic unchanged) and photophobia. Additional symptoms do not include neck stiffness, pain, aura or anxiety. Medical issues also include cerebral vascular accident and hypertension. Workup history includes MRI, CT scan and carotid ultrasound.    Past Medical History  Diagnosis Date  . Hypertension   . Neuropathy   . Chiari I malformation   . Hyperlipidemia   . CVA (cerebral infarction)   . Migraine   . Daily headache     "lately" (05/18/12)  . GERD (gastroesophageal reflux disease)   . Chronic kidney disease     not on dialysis  . Stroke 2008;  ?2009;     7 strokes, L sided deficits  . Sleep apnea     USES CPAP    Past Surgical History  Procedure Laterality Date  . Reduction mammaplasty  ~ 2002   . Cesarean section  5621; 1983; 1989  . Craniectomy suboccipital w/ cervical laminectomy / chiari  2000's  . Brain surgery      Family History  Problem Relation Age of Onset  . Hyperlipidemia Mother   . Heart disease Mother   . Hypertension Mother   . Heart disease Sister   . Heart disease Brother     History  Substance Use Topics  . Smoking status: Never Smoker   . Smokeless tobacco: Never Used  . Alcohol Use: No    OB History   Grav Para Term Preterm Abortions TAB SAB Ect Mult Living                  Review of Systems  Constitutional: Negative for fever, chills, diaphoresis, activity change, appetite change and fatigue.  HENT: Negative for congestion, sore throat, facial swelling, rhinorrhea, neck pain and neck stiffness.   Eyes: Positive for photophobia. Negative for discharge.  Respiratory: Negative for cough, chest tightness and shortness of breath.   Cardiovascular: Negative for chest pain, palpitations and leg swelling.  Gastrointestinal: Negative for nausea, vomiting, abdominal pain and diarrhea.  Endocrine: Negative for polydipsia and polyuria.  Genitourinary: Negative for dysuria, frequency, difficulty urinating and pelvic pain.  Musculoskeletal: Negative for back pain and arthralgias.  Skin: Negative for color change and wound.  Allergic/Immunologic: Negative for immunocompromised state.  Neurological: Positive for weakness (chronic unchanged) and numbness. Negative for facial asymmetry and headaches.  Hematological: Does not bruise/bleed easily.  Psychiatric/Behavioral: Negative for confusion and  agitation.    Allergies  Iodine  Home Medications   Current Outpatient Rx  Name  Route  Sig  Dispense  Refill  . albuterol (PROVENTIL HFA;VENTOLIN HFA) 108 (90 BASE) MCG/ACT inhaler   Inhalation   Inhale 2 puffs into the lungs every 6 (six) hours as needed. For wheezing         . amLODipine (NORVASC) 10 MG tablet   Oral   Take 1 tablet (10 mg total) by  mouth daily.   30 tablet   0   . aspirin EC 81 MG tablet   Oral   Take 81 mg by mouth daily.         . clopidogrel (PLAVIX) 75 MG tablet   Oral   Take 75 mg by mouth daily.         Marland Kitchen gabapentin (NEURONTIN) 100 MG capsule   Oral   Take 300 mg by mouth at bedtime.          . hydrochlorothiazide (HYDRODIURIL) 25 MG tablet   Oral   Take 25 mg by mouth daily.         . iron polysaccharides (NIFEREX) 150 MG capsule   Oral   Take 150 mg by mouth 2 (two) times daily.         . meclizine (ANTIVERT) 25 MG tablet   Oral   Take 25 mg by mouth 2 (two) times daily as needed. FOR DIZZINESS         . metoprolol succinate (TOPROL-XL) 50 MG 24 hr tablet   Oral   Take 50 mg by mouth daily. Take with or immediately following a meal.         . sertraline (ZOLOFT) 50 MG tablet   Oral   Take 50 mg by mouth daily.         . simvastatin (ZOCOR) 40 MG tablet   Oral   Take 1 tablet (40 mg total) by mouth at bedtime.   90 tablet   3   . traMADol (ULTRAM) 50 MG tablet   Oral   Take 50 mg by mouth every 6 (six) hours as needed. For pain         . traZODone (DESYREL) 50 MG tablet   Oral   Take 50 mg by mouth at bedtime.           BP 155/93  Pulse 79  Temp(Src) 99.1 F (37.3 C) (Oral)  Resp 15  SpO2 100%  Physical Exam  Constitutional: She is oriented to person, place, and time. She appears well-developed and well-nourished. No distress.  HENT:  Head: Normocephalic and atraumatic.  Mouth/Throat: No oropharyngeal exudate.  Eyes: Pupils are equal, round, and reactive to light.  Neck: Normal range of motion. Neck supple.  Cardiovascular: Normal rate, regular rhythm and normal heart sounds.  Exam reveals no gallop and no friction rub.   No murmur heard. Pulmonary/Chest: Effort normal and breath sounds normal. No respiratory distress. She has no wheezes. She has no rales.  Abdominal: Soft. Bowel sounds are normal. She exhibits no distension and no mass. There is no  tenderness. There is no rebound and no guarding.  Musculoskeletal: Normal range of motion. She exhibits no edema and no tenderness.  Neurological: She is alert and oriented to person, place, and time. A sensory deficit is present. No cranial nerve deficit. She exhibits abnormal muscle tone. Coordination normal. GCS eye subscore is 4. GCS verbal subscore is 5. GCS motor subscore is 6.  4/5  LLE.  Decreased light touch entire left side of body from face to foot  Skin: Skin is warm and dry.  Psychiatric: She has a normal mood and affect.    ED Course  Procedures (including critical care time)  Labs Reviewed  CBC - Abnormal; Notable for the following:    WBC 11.0 (*)    Hemoglobin 11.9 (*)    HCT 33.9 (*)    RDW 16.4 (*)    All other components within normal limits  DIFFERENTIAL - Abnormal; Notable for the following:    Neutrophils Relative 80 (*)    Neutro Abs 8.8 (*)    All other components within normal limits  COMPREHENSIVE METABOLIC PANEL - Abnormal; Notable for the following:    Glucose, Bld 127 (*)    Creatinine, Ser 1.19 (*)    GFR calc non Af Amer 52 (*)    GFR calc Af Amer 61 (*)    All other components within normal limits  GLUCOSE, CAPILLARY - Abnormal; Notable for the following:    Glucose-Capillary 119 (*)    All other components within normal limits  PROTIME-INR  APTT  TROPONIN I   Ct Head (brain) Wo Contrast  08/28/2012  *RADIOLOGY REPORT*  Clinical Data: Right facial numbness and right sided weakness.  CT HEAD WITHOUT CONTRAST  Technique:  Contiguous axial images were obtained from the base of the skull through the vertex without contrast.  Comparison: 07/06/2012 and prior head CTs  Findings: Chronic small vessel white matter ischemic changes and remote infarcts in bilateral basil ganglia/periventricular white matter and right thalamus again noted.  No acute intracranial abnormalities are identified, including mass lesion or mass effect, hydrocephalus, extra-axial  fluid collection, midline shift, hemorrhage, or acute infarction.  Occipital craniectomy again noted.  IMPRESSION: No evidence of acute intracranial abnormality.  Chronic small vessel white matter ischemic changes and remote infarcts as described.   Original Report Authenticated By: Harmon Pier, M.D.      1. Numbness on left side       MDM  Pt is a 52 y.o. female with pertinent PMHX of multiple CVA/TIA, HTN, HLD, GERD, migraine, chiari malformation who presents with L sided numbness of whole body since 2pm yesterday, similar to prior CVAs. No new weakness, no recent illness or injury,  No complaints of pain, other than chronic, unchanged migraine headache today.  Basic labs, trop, CT head ordered.  No neuro findings on exam except subjective L sided dec fine touch and unchanged LLE weakness.  Ddx includes CVA, TIA, intracranial bleed, complex migraine, electrolyte imbalance.  Doubt meningitis given no fever or neck pain.   10:52 PM have consulted neurology with results of unchanged CT head.  Given she has had recent carotid doppler & echo, they did not feel neuro admission needed.  Family medicine consulted to admission.   1. Numbness on left side      Labs and imaging considered in decision making, reviewed by myself.  Imaging interpreted by radiology. Pt care discussed with my attending, Dr. Rhunette Croft.         Toy Cookey, MD 08/29/12 0128  I saw and evaluated the patient, reviewed the resident's note and I agree with the findings and plan.  Derwood Kaplan, MD 08/29/12 [phone]

## 2012-08-28 NOTE — H&P (Signed)
Family Medicine Teaching [facility] Admission History and Physical Service Pager: [phone]  Patient name: Lindsey [name] Medical record number: [id number] Date of birth: March 16, 1961 Age: 52 y.o. Gender: female  Primary Care Provider: Rodman Pickle, MD  Chief Complaint: Weakness Right Side   Assessment and Plan: AZAYLA POLO is a 52 y.o. year old female presenting with right sided weakness concerning for TIA vs CVA. Right sided weakness appears resolved on physical exam, especially compared to residual left sided weakness.   1)Weakness, with history of Recurrent Stroke   - PMH of multiple strokes, non-compliance with medications   - On aspirin and plavix, continue   - Recent MRI on 07/07/12 showing no acute infarct. Far outside window for tPA  - prior carotid doppler From 05/20/2012 shows no significant carotid artery stenosis, defer new study for now   - LDL 90 in 11/13, A1c 4.9 on 05/19/2012.  Will repeat at this visit.   - Monitor with serial neuro exams.   - PT/OT consult   - consider neuro consult in AM  2)Diastolic CHF   - Stable   - Echo 05/20/2012 shows EF 60-65% and Grade 1 diastolic dysf   - Rehydrate gently and monitor for fluid overload  3)HTN   - Stable   - will tolerate higher pressures with risk of stroke/TIA   - hold HCTZ, amlodipine   - Metoprolol XL at 1/2 home dose- 25 mg daily  4) HLD   - Continue lipitor, LDL 90 in 11/13  5) Chronic pain: continue gabapentin and tramadol  1. FEN/GI: NPO until swallow eval.  NS @ 100 cc/hr  2. Prophylaxis: SCD.  Will start Heparin when hemorrhagic stroke R/O 3. Disposition: Telemetry 4. Code Status: DNR  History of Present Illness: Lindsey [name] is a 52 y.o. year old female presenting with weakness and hemiparesis on the right side.  Pt states this began around 2 PM.  Her Sx have persisted and she also has some blurred vision of the right eye.  When her Sx began around 2 PM, she was unable to ambulate and was worried  she was having another TIA since her Sx were similar to previous events.  Feels that weakness has not improved since it started this afternoon.   In the ED, CT did not show acute hemorrhagic stroke.  A troponin was negative x 1, CBC with WBC of 11.0, and CMP with Creatinine of 1.19 (baseline 1.00-1.60).  Neurology was consulted and felt that with an unchanged CT scan that she did not warrant neurology admission.   Pt has residual left sided hemiparesis from multiple CVA in the past.  She was hospitalized most recently in January for TIA that did not show acute infarct.  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 Pt received PT/OT upon d/c and has not regained full strength of her left side.  Pt did have Echo and Carotid Doppler performed on 05/20/13.  Denies fever, chills, nausea, vomiting, diarrhea, CP, SOB.     Patient Active Problem List  Diagnosis  . HYPERLIPIDEMIA  . OBESITY, MORBID  . Essential hypertension, benign  . CVA  . GASTROINTESTINAL DISORDER, FUNCTIONAL  . ARNOLD-CHIARI MALFORMATION  . VERTIGO  . SLEEP APNEA  . CONTRAST DYE ALLERGY  . BACK PAIN, LUMBAR, WITH RADICULOPATHY  . Neuropathy  . Acute kidney injury  . Rhabdomyolysis  . Uncontrolled hypertension  . Medically noncompliant  .  Leg swelling   Past Medical History: Past Medical History  Diagnosis Date  . Hypertension   . Neuropathy   . Chiari I malformation   . Hyperlipidemia   . CVA (cerebral infarction)   . Migraine   . Daily headache     "lately" (05/18/12)  . GERD (gastroesophageal reflux disease)   . Chronic kidney disease     not on dialysis  . Stroke 2008;  ?2009;     7 strokes, L sided deficits  . Sleep apnea     USES CPAP   Past Surgical History: Past Surgical History  Procedure Laterality Date  . Reduction mammaplasty  ~ 2002  . Cesarean section  1610; 1983; 1989  . Craniectomy  suboccipital w/ cervical laminectomy / chiari  2000's  . Brain surgery     Social History: History  Substance Use Topics  . Smoking status: Never Smoker   . Smokeless tobacco: Never Used  . Alcohol Use: No   For any additional social history documentation, please refer to relevant sections of EMR.  Family History: Family History  Problem Relation Age of Onset  . Hyperlipidemia Mother   . Heart disease Mother   . Hypertension Mother   . Heart disease Sister   . Heart disease Brother    Allergies: Allergies  Allergen Reactions  . Iodine Swelling    Tongue swells up per pt.   No current facility-administered medications on file prior to encounter.   Current Outpatient Prescriptions on File Prior to Encounter  Medication Sig Dispense Refill  . albuterol (PROVENTIL HFA;VENTOLIN HFA) 108 (90 BASE) MCG/ACT inhaler Inhale 2 puffs into the lungs every 6 (six) hours as needed. For wheezing      . amLODipine (NORVASC) 10 MG tablet Take 1 tablet (10 mg total) by mouth daily.  30 tablet  0  . clopidogrel (PLAVIX) 75 MG tablet Take 75 mg by mouth daily.      Marland Kitchen gabapentin (NEURONTIN) 100 MG capsule Take 300 mg by mouth at bedtime.       . hydrochlorothiazide (HYDRODIURIL) 25 MG tablet Take 25 mg by mouth daily.      . iron polysaccharides (NIFEREX) 150 MG capsule Take 150 mg by mouth 2 (two) times daily.      . meclizine (ANTIVERT) 25 MG tablet Take 25 mg by mouth 2 (two) times daily as needed. FOR DIZZINESS      . metoprolol succinate (TOPROL-XL) 50 MG 24 hr tablet Take 50 mg by mouth daily. Take with or immediately following a meal.      . sertraline (ZOLOFT) 50 MG tablet Take 50 mg by mouth daily.      . simvastatin (ZOCOR) 40 MG tablet Take 1 tablet (40 mg total) by mouth at bedtime.  90 tablet  3  . traMADol (ULTRAM) 50 MG tablet Take 50 mg by mouth every 6 (six) hours as needed. For pain      . traZODone (DESYREL) 50 MG tablet Take 50 mg by mouth at bedtime.       Review Of  Systems: Per HPI with the following additions: None  Otherwise 12 point review of systems was performed and was unremarkable.  Physical Exam: BP 156/91  Pulse 72  Temp(Src) 99.1 F (37.3 C) (Oral)  Resp 13  SpO2 99% Exam: Gen: NAD, alert, cooperative  HEENT: NCAT, EOMI, PERRL, MMM  CV:RRR, good S1/S2, no murmur  Resp: CTABL, no wheezes, non-labored  Abd: SNTND, BS present, no guarding or organomegaly  Ext: Trace edema B/L  Neuro: Alert and oriented, strength 5/5 in BL LE, Strength 3/5 in LUE, 5/5 vin RUE, Sensation intact throughout. Face symmetric, speech clear. Romberg negative, Pronator drift negative, dysmetria on left with finger to nose, normal finger to nose on right, normal visual field test   Labs and Imaging: CBC BMET   Recent Labs Lab 08/28/12 2049  WBC 11.0*  HGB 11.9*  HCT 33.9*  PLT 319    Recent Labs Lab 08/28/12 2049  NA 136  K 3.9  CL 102  CO2 22  BUN 12  CREATININE 1.19*  GLUCOSE 127*  CALCIUM 9.3     Hess, Judie Grieve, DO 08/28/2012, 11:25 PM  I saw and examined this patient with Dr. Paulina Fusi and participated in the formulation of the assessment and plan. I agree with the above note with the highlighted additions.  Marena Chancy, PGY-2 Family Medicine Resident

## 2012-08-29 ENCOUNTER — Encounter (HOSPITAL_COMMUNITY): Payer: Self-pay | Admitting: General Practice

## 2012-08-29 ENCOUNTER — Telehealth: Payer: Self-pay | Admitting: Clinical

## 2012-08-29 DIAGNOSIS — R200 Anesthesia of skin: Secondary | ICD-10-CM | POA: Diagnosis present

## 2012-08-29 DIAGNOSIS — G459 Transient cerebral ischemic attack, unspecified: Secondary | ICD-10-CM | POA: Diagnosis present

## 2012-08-29 LAB — LIPID PANEL
Cholesterol: 178 mg/dL (ref 0–200)
HDL: 60 mg/dL (ref >39)
Total CHOL/HDL Ratio: 3 RATIO

## 2012-08-29 LAB — GLUCOSE, CAPILLARY

## 2012-08-29 MED ORDER — SODIUM CHLORIDE 0.9 % IV SOLN
INTRAVENOUS | Status: DC
  Administered 2012-08-29: 04:00:00 via INTRAVENOUS

## 2012-08-29 MED ORDER — CLOPIDOGREL BISULFATE 75 MG PO TABS
75.0000 mg | ORAL_TABLET | Freq: Every day | ORAL | Status: DC
  Administered 2012-08-29 – 2012-08-30 (×2): 75 mg via ORAL
  Filled 2012-08-29 (×2): qty 1

## 2012-08-29 MED ORDER — POLYSACCHARIDE IRON COMPLEX 150 MG PO CAPS
150.0000 mg | ORAL_CAPSULE | Freq: Two times a day (BID) | ORAL | Status: DC
  Administered 2012-08-29 – 2012-08-30 (×3): 150 mg via ORAL
  Filled 2012-08-29 (×4): qty 1

## 2012-08-29 MED ORDER — METOPROLOL SUCCINATE ER 25 MG PO TB24
25.0000 mg | ORAL_TABLET | Freq: Every day | ORAL | Status: DC
  Administered 2012-08-29 – 2012-08-30 (×2): 25 mg via ORAL
  Filled 2012-08-29 (×2): qty 1

## 2012-08-29 MED ORDER — SIMVASTATIN 40 MG PO TABS
40.0000 mg | ORAL_TABLET | Freq: Every day | ORAL | Status: DC
  Administered 2012-08-29: 40 mg via ORAL
  Filled 2012-08-29 (×2): qty 1

## 2012-08-29 MED ORDER — ASPIRIN EC 81 MG PO TBEC
81.0000 mg | DELAYED_RELEASE_TABLET | Freq: Every day | ORAL | Status: DC
  Administered 2012-08-29 – 2012-08-30 (×2): 81 mg via ORAL
  Filled 2012-08-29 (×2): qty 1

## 2012-08-29 MED ORDER — GABAPENTIN 300 MG PO CAPS
300.0000 mg | ORAL_CAPSULE | Freq: Every day | ORAL | Status: DC
  Administered 2012-08-29: 300 mg via ORAL
  Filled 2012-08-29 (×2): qty 1

## 2012-08-29 MED ORDER — TRAMADOL HCL 50 MG PO TABS
50.0000 mg | ORAL_TABLET | Freq: Four times a day (QID) | ORAL | Status: DC | PRN
  Administered 2012-08-29: 50 mg via ORAL
  Filled 2012-08-29: qty 1

## 2012-08-29 MED ORDER — ALBUTEROL SULFATE HFA 108 (90 BASE) MCG/ACT IN AERS: 2.0000 | INHALATION_SPRAY | Freq: Four times a day (QID) | RESPIRATORY_TRACT | Status: DC | PRN

## 2012-08-29 MED ORDER — SERTRALINE HCL 50 MG PO TABS
50.0000 mg | ORAL_TABLET | Freq: Every day | ORAL | Status: DC
  Administered 2012-08-29 – 2012-08-30 (×2): 50 mg via ORAL
  Filled 2012-08-29 (×2): qty 1

## 2012-08-29 MED ORDER — HEPARIN SODIUM (PORCINE) 5000 UNIT/ML IJ SOLN
5000.0000 [IU] | Freq: Three times a day (TID) | INTRAMUSCULAR | Status: DC
  Administered 2012-08-29: 5000 [IU] via SUBCUTANEOUS
  Filled 2012-08-29 (×6): qty 1

## 2012-08-29 MED ORDER — HYDROCODONE-ACETAMINOPHEN 5-325 MG PO TABS
1.0000 | ORAL_TABLET | Freq: Once | ORAL | Status: AC
  Administered 2012-08-29: 1 via ORAL
  Filled 2012-08-29: qty 1

## 2012-08-29 MED ORDER — TRAZODONE HCL 50 MG PO TABS
50.0000 mg | ORAL_TABLET | Freq: Every day | ORAL | Status: DC
  Filled 2012-08-29 (×2): qty 1

## 2012-08-29 NOTE — Progress Notes (Signed)
PGY 1 Update: Pt is doing well this AM, about back to baseline.    Exam: NAD Able to walk without help No focal neurologic deficits RRR, CTA, NABS  A/P: TIA  -Pt about back to baseline   -LDL at 101, Cholesterol 178  - Do not believe pt needs MRI/MRA/2D Echo/Carotid dopplers as have all been done multiple times in the past three months   -Maxed out on medications  -Will have PT/OT evaluate HTN - Holding home meds, consider restarting this PM   Dispo: Pending improvement and PT/OT w/u

## 2012-08-29 NOTE — Progress Notes (Signed)
Pt is ready to go home, gave her all d/c educations and informed about next follow up. Pt is waiting for transportation, no concerns. Fuller Canada, RN. 08/29/12

## 2012-08-29 NOTE — Progress Notes (Addendum)
Patient still waiting on son for ride home. Patient has called son with no answer. Informed Dr. Paulina Fusi. Olevia Perches

## 2012-08-29 NOTE — Progress Notes (Signed)
PCP Note:  Spoke with Ms. Delucchi today. She states she is doing much better and is looking forward to going home. Her home health PT was difficult to arrange when she left SNF, but PT evaluation in the hospital states she does not require these services at this time. She lives with her son and his girlfriend and has plenty of resources at home. I have encouraged patient to keep her follow up appointments with me to avoid frequent hospitalizations, and patient agrees.  I appreciate the Family Medicine Teaching Service's excellent care of Ms. Schlabach. I have discussed her case with Drs. Chambliss and Lula Olszewski and agree with close outpatient follow up. Please let me know if I can be of any assistance.  Amber M. Hairford, M.D. 08/29/2012 2:56 PM

## 2012-08-29 NOTE — ED Notes (Signed)
No changes, up to floor with EMT on monitor.

## 2012-08-29 NOTE — Progress Notes (Signed)
Received report on patient, awaiting ride home. Discharge instructions already given. No complaints.  Lindsey [name]

## 2012-08-29 NOTE — Telephone Encounter (Signed)
Family Medicine Clinical Social Worker contacted [facility] rep Anibal Henderson to inform that pt is in the hospital & dc'ing today. CSW observed from previous clinic notes that RN for [facility] attempted to reach pt but was unsuccessful. [facility] rep to see pt today prior to dc.   Theresia Bough, MSW, Theresia Majors [phone]
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
## 2012-08-29 NOTE — Discharge Summary (Signed)
Physician Discharge Summary  Patient ID: Lindsey [name] MRN: [id number] DOB: 03-08-1961 Age: 52 y.o.  Admit date: 08/28/2012 Discharge date: 08/29/2012 Admitting Physician: Tobey Grim, MD  PCP: Lindsey Pickle, MD  Consultants:Neurology (ED consult)     Discharge Diagnosis:  Principal Problem:   TIA (transient ischemic attack) Active Problems:   HYPERLIPIDEMIA   Essential hypertension, benign   CVA   Numbness on left side    Hospital Course Lindsey [name] is a 52 y.o. year old female presenting with right sided weakness concerning for TIA vs CVA.  1) Weakness with history of recurrent CVAs - Pt presented to the hospital with right sided weakness (has residual hemiparesis Left side).  In the ED there was a concern for recurrent stroke vs TIA.  A CT scan was performed and did not reveal acute hemorrhage.  Pt returned to her baseline while in the ED and had full muscle strength and sensation on her right side.  She was admitted for observation and had risk stratification labs performed including Lipid Panel with TC 178, LDL 101, A1C of 5.3.  She has had three MRIs performed in the past three months, the most recent which did not show acute infarct.  After further discussion, an MRI was not ordered due to low likelihood of acute infarction along with no change in current medical management.  She also had a 2 D Echo and Carotid Dopplers performed within the last month and these were not repeated as well.  PT/OT were consulted for evaluation and both cleared her to return home.  Pt was stable during her hospital stay and was at baseline prior to discharge.  2) Diastolic Grade 1 CHF - Stable, continued home medications and rehydrated gently.  No S/Sx of fluid overload.   3) HTN - Stable, held her HCTZ and Norvasc on admission and decreased her Toprol XL to 25 mg qd (from 50 qd).  Discharged on home medications at normal doses.   4) HLD - lipid Panel ordered, LDL 101.  Continued home  Zocor  5) Chronic Pain - Stable, continued home meds.          Discharge PE   Filed Vitals:   08/29/12 1436  BP: 160/94  Pulse: 69  Temp: 98.6 F (37 C)  Resp: 20   Gen: NAD HEENT: PERRLA, EOMI B/L, MMM, Lindsey [name]/AT CV: RRR, S1/S2 normal, no murmurs appreciated  Resp: CTA B/L, no wheezes Abd: S/NT/ND, NABS Ext: trace edema B/L Neuro: AAO x 3, 5/5 MS throughout B/L, sensation intact, Romberg negative, Pronator drift negative, + dysmetria left finger to nose (normal on right)       Procedures/Imaging:  Ct Head (brain) Wo Contrast  08/28/2012    IMPRESSION: No evidence of acute intracranial abnormality.  Chronic small vessel white matter ischemic changes and remote infarcts as described.   Original Report Authenticated By: Harmon Pier, M.D.     Labs  CBC  Recent Labs Lab 08/28/12 2049  WBC 11.0*  HGB 11.9*  HCT 33.9*  PLT 319   BMET  Recent Labs Lab 08/28/12 2049  NA 136  K 3.9  CL 102  CO2 22  BUN 12  CREATININE 1.19*  CALCIUM 9.3  PROT 8.2  BILITOT 0.3  ALKPHOS 84  ALT 11  AST 14  GLUCOSE 127*   TROPONIN I     Status: None   Collection Time    08/28/12  8:49 PM      Result Value Range  Troponin I <0.30  <0.30 ng/mL   Comment:         LIPID PANEL     Status: Abnormal   Collection Time    08/29/12  5:25 AM      Result Value Range   Cholesterol 178  0 - 200 mg/dL   Triglycerides 84  <829 mg/dL   HDL 60  >56 mg/dL   Total CHOL/HDL Ratio 3.0     VLDL 17  0 - 40 mg/dL   LDL Cholesterol 213 (*) 0 - 99 mg/dL   Comment:            Total Cholesterol/HDL:CHD Risk     Coronary Heart Disease Risk Table                         Men   Women      1/2 Average Risk   3.4   3.3      Average Risk       5.0   4.4      2 X Average Risk   9.6   7.1      3 X Average Risk  23.4   11.0                Use the calculated Patient Ratio     above and the CHD Risk Table     to determine the patient's CHD Risk.                ATP III CLASSIFICATION (LDL):      <100      mg/dL   Optimal      086-578  mg/dL   Near or Above                        Optimal      130-159  mg/dL   Borderline      469-629  mg/dL   High      >528     mg/dL   Very High  HEMOGLOBIN A1C     Status: None   Collection Time    08/29/12  5:25 AM      Result Value Range   Hemoglobin A1C 5.3  <5.7 %       Patient condition at time of discharge/disposition: stable  Disposition-home   Follow up issues: 1. Compliance with Medications for secondary prevention of CVA.  On ASA and Plavix for atherosclerotic large vessel disease for three months (started in December) and can be transitioned to Plavix monotherapy    Discharge follow up:  Follow-up Information   Follow up with Lindsey Pickle, MD. (Friday 2/28 @ 130 PM)    Contact information:   [address] [place] [address] [phone]      Discharge Orders   Future Appointments Provider Department Dept Phone   09/02/2012 1:30 PM Lindsey Nydia Bouton, MD [facility] [phone]   09/09/2012 9:30 AM Lindsey Lofts Nydia Bouton, MD [facility] [phone]   Future Orders Complete By Expires     Call MD for:  difficulty breathing, headache or visual disturbances  As directed     Call MD for:  extreme fatigue  As directed     Call MD for:  persistant dizziness or light-headedness  As directed     Diet - low sodium heart healthy  As directed     Increase activity slowly  As  directed         Discharge Instructions: Please refer to Patient Instructions section of EMR for full details.  Patient was counseled important signs and symptoms that should prompt return to medical care, changes in medications, dietary instructions, activity restrictions, and follow up appointments.  Significant instructions noted below:    Discharge Medications   Medication List    TAKE these medications       albuterol 108 (90 BASE) MCG/ACT inhaler  Commonly known as:  PROVENTIL HFA;VENTOLIN HFA  Inhale 2 puffs  into the lungs every 6 (six) hours as needed. For wheezing     amLODipine 10 MG tablet  Commonly known as:  NORVASC  Take 1 tablet (10 mg total) by mouth daily.     aspirin EC 81 MG tablet  Take 81 mg by mouth daily.     clopidogrel 75 MG tablet  Commonly known as:  PLAVIX  Take 75 mg by mouth daily.     gabapentin 100 MG capsule  Commonly known as:  NEURONTIN  Take 300 mg by mouth at bedtime.     hydrochlorothiazide 25 MG tablet  Commonly known as:  HYDRODIURIL  Take 25 mg by mouth daily.     iron polysaccharides 150 MG capsule  Commonly known as:  NIFEREX  Take 150 mg by mouth 2 (two) times daily.     meclizine 25 MG tablet  Commonly known as:  ANTIVERT  Take 25 mg by mouth 2 (two) times daily as needed. FOR DIZZINESS     metoprolol succinate 50 MG 24 hr tablet  Commonly known as:  TOPROL-XL  Take 50 mg by mouth daily. Take with or immediately following a meal.     sertraline 50 MG tablet  Commonly known as:  ZOLOFT  Take 50 mg by mouth daily.     simvastatin 40 MG tablet  Commonly known as:  ZOCOR  Take 1 tablet (40 mg total) by mouth at bedtime.     traMADol 50 MG tablet  Commonly known as:  ULTRAM  Take 50 mg by mouth every 6 (six) hours as needed. For pain     traZODone 50 MG tablet  Commonly known as:  DESYREL  Take 50 mg by mouth at bedtime.            Gildardo Cranker, DO of [name] 08/29/2012 10:05 PM

## 2012-08-29 NOTE — H&P (Signed)
FMTS Attending Admission Note: Renold Don MD Personal pager:  [phone] FPTS Service Pager:  [phone]  I  have seen and examined this patient, reviewed their chart. I have discussed this patient with the resident. I agree with the resident's findings, assessment and care plan.  Briefly, 52 yo F with history of of multiple CVAs who presents with 1 day history of Right sided weakness.  She chronically has Left sided weakness stemming from prior stroke.  Started yesterday with inability to ambulate.  [facility], CT of head showed chronic ischemic infarcts but no acute bleed.  When evaluated by residents, weakness evidently resolved.  However this AM patient states weakness is present and "never went away."  Able to eat breakfast with Right hand and ambulate to restroom.    Exam:  Gen: AAF lying in hospital bed, no acute distress Heart:  RRR Lungs:  Clear Neuro:  Alert and oriented.  STrength 4/5 Right upper extremity, 5/5 Left upper extremity.  4/5 BL lower extremities.    Imp/Plan: 1.  Weakness:  - Acute stroke seems less likely as strength was 5/5 when tested yesterday evening but now 4/5.  While talking she was able to move her table out of her way and pick up and put down her cereal bowl using her Right hand without trouble.  Also able to shake hands with Right hand with good strong handshake - She does admit that she is likely not strong enough to go home.   - PT/OT consult already in place - She is already on ASA/Plavix.  Has history of noncompliance with medications.  Currently maxed out on meds, continue to encourage compliance.  No need for further imaging studies as stroke much less likely, would not change our current management.   See resident note for other chronic issues.
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
## 2012-08-29 NOTE — Progress Notes (Signed)
Received call from Theresia Bough LCSW requesting follow up with patient at bedside.  Confirmed that patient does want to receive services.  Confirmed contact number.  Instructed patient to call her [facility] Nurse at the number provided.  Informed client the this would be our third and final attempt to engage her for services.  She expressed understanding.  Patient will receive a post discharge transition of care call and will be evaluated for monthly home visits for assessments and disease process education.  Of note, [facility] Care Management services does not replace or interfere with any services that are arranged by inpatient case management or social work.  For additional questions or referrals please contact Anibal Henderson BSN RN [facility] Liaison at [phone].

## 2012-08-30 ENCOUNTER — Other Ambulatory Visit (HOSPITAL_COMMUNITY): Payer: Self-pay | Admitting: Family Medicine

## 2012-08-30 NOTE — Progress Notes (Signed)
[name] son Lindsey [name] came to take her home @ 11:14 am, 08/30/12.  Danne Harbor 08/30/12

## 2012-08-30 NOTE — Progress Notes (Signed)
Found the patient lying on the floor while stepping into the room for shift handover. Pt stated just lost her balance when got out from bed to go to bathroom, and sat on the floor. No complains of pain, moves all extremity, and mentioned didn't hit any where. Assisted the patient to stand up,and took her to bed. All post fall protocols been completed by night nurse Olevia Perches. Danne Harbor 08/30/12

## 2012-08-30 NOTE — Discharge Summary (Signed)
I have reviewed this discharge summary and agree.    

## 2012-08-30 NOTE — Discharge Summary (Signed)
Physician Discharge Summary  Patient ID: Lindsey [name] MRN: [id number] DOB: Apr 24, 1961 Age: 52 y.o.  Admit date: 08/28/2012 Discharge date: 08/30/2012 Admitting Physician: Tobey Grim, MD  PCP: Rodman Pickle, MD  Consultants:Neurology (ED consult)     Discharge Diagnosis:  Principal Problem:   TIA (transient ischemic attack) Active Problems:   HYPERLIPIDEMIA   Essential hypertension, benign   CVA   Numbness on left side    Hospital Course Lindsey [name] is a 52 y.o. year old female presenting with right sided weakness concerning for TIA vs CVA.  1) Weakness with history of recurrent CVAs - Pt presented to the hospital with right sided weakness (has residual hemiparesis Left side).  In the ED there was a concern for recurrent stroke vs TIA.  A CT scan was performed and did not reveal acute hemorrhage.  Pt returned to her baseline while in the ED and had full muscle strength and sensation on her right side.  She was admitted for observation and had risk stratification labs performed including Lipid Panel with TC 178, LDL 101, A1C of 5.3.  She has had three MRIs performed in the past three months, the most recent which did not show acute infarct.  After further discussion, an MRI was not ordered due to low likelihood of acute infarction along with no change in current medical management.  She also had a 2 D Echo and Carotid Dopplers performed within the last month and these were not repeated as well.  PT/OT were consulted for evaluation and both cleared her to return home.  Pt was stable during her hospital stay and was at baseline prior to discharge.  2) Diastolic Grade 1 CHF - Stable, continued home medications and rehydrated gently.  No S/Sx of fluid overload.   3) HTN - Stable, held her HCTZ and Norvasc on admission and decreased her Toprol XL to 25 mg qd (from 50 qd).  Discharged on home medications at normal doses.   4) HLD - lipid Panel ordered, LDL 101.  Continued home  Zocor  5) Chronic Pain - Stable, continued home meds.   6) Fall - pt fell on the floor prior to discharge when she slipped trying to get in bed landing on her left leg and gluteus region.  Pt denied weakness, tingling, numbness, spinous process pain.  No neurologic deficits, MS 5/5, no bruising noted, no TTP along spine.  PT cleared patient to be sent home.          Discharge PE   Filed Vitals:   08/30/12 0732  BP: 183/105  Pulse: 70  Temp: 97.8 F (36.6 C)  Resp: 20   Gen: NAD HEENT: PERRLA, EOMI B/L, MMM, [place]/AT CV: RRR, S1/S2 normal, no murmurs appreciated  Resp: CTA B/L, no wheezes Abd: S/NT/ND, NABS Ext: trace edema B/L Neuro: AAO x 3, 5/5 MS throughout B/L, sensation intact, Romberg negative, Pronator drift negative, + dysmetria left finger to nose (normal on right)       Procedures/Imaging:  Ct Head (brain) Wo Contrast  08/28/2012    IMPRESSION: No evidence of acute intracranial abnormality.  Chronic small vessel white matter ischemic changes and remote infarcts as described.   Original Report Authenticated By: Harmon Pier, M.D.     Labs  CBC  Recent Labs Lab 08/28/12 2049  WBC 11.0*  HGB 11.9*  HCT 33.9*  PLT 319   BMET  Recent Labs Lab 08/28/12 2049  NA 136  K 3.9  CL 102  CO2 22  BUN 12  CREATININE 1.19*  CALCIUM 9.3  PROT 8.2  BILITOT 0.3  ALKPHOS 84  ALT 11  AST 14  GLUCOSE 127*   TROPONIN I     Status: None   Collection Time    08/28/12  8:49 PM      Result Value Range   Troponin I <0.30  <0.30 ng/mL   Comment:         LIPID PANEL     Status: Abnormal   Collection Time    08/29/12  5:25 AM      Result Value Range   Cholesterol 178  0 - 200 mg/dL   Triglycerides 84  <308 mg/dL   HDL 60  >65 mg/dL   Total CHOL/HDL Ratio 3.0     VLDL 17  0 - 40 mg/dL   LDL Cholesterol 784 (*) 0 - 99 mg/dL   Comment:            Total Cholesterol/HDL:CHD Risk     Coronary Heart Disease Risk Table                         Men   Women      1/2  Average Risk   3.4   3.3      Average Risk       5.0   4.4      2 X Average Risk   9.6   7.1      3 X Average Risk  23.4   11.0                Use the calculated Patient Ratio     above and the CHD Risk Table     to determine the patient's CHD Risk.                ATP III CLASSIFICATION (LDL):      <100     mg/dL   Optimal      696-295  mg/dL   Near or Above                        Optimal      130-159  mg/dL   Borderline      284-132  mg/dL   High      >440     mg/dL   Very High  HEMOGLOBIN A1C     Status: None   Collection Time    08/29/12  5:25 AM      Result Value Range   Hemoglobin A1C 5.3  <5.7 %       Patient condition at time of discharge/disposition: stable  Disposition-home   Follow up issues: 1. Compliance with Medications for secondary prevention of CVA.  On ASA and Plavix for atherosclerotic large vessel disease for three months (started in December) and can be transitioned to Plavix monotherapy    2. Follow up fall onto gluteus region  Discharge follow up:  Follow-up Information   Follow up with Rodman Pickle, MD. (Friday 2/28 @ 130 PM)    Contact information:   [address] [place] [address] [phone]          Discharge Orders   Future Appointments Provider Department Dept Phone   09/02/2012 1:30 PM Joice Lofts Nydia Bouton, MD [facility] [phone]   09/09/2012 9:30 AM Joice Lofts Nydia Bouton, MD [facility] [phone]  Future Orders Complete By Expires     Call MD for:  difficulty breathing, headache or visual disturbances  As directed     Call MD for:  extreme fatigue  As directed     Call MD for:  persistant dizziness or light-headedness  As directed     Diet - low sodium heart healthy  As directed     Increase activity slowly  As directed         Discharge Instructions: Please refer to Patient Instructions section of EMR for full details.  Patient was counseled important signs and symptoms that  should prompt return to medical care, changes in medications, dietary instructions, activity restrictions, and follow up appointments.  Significant instructions noted below:    Discharge Medications   Medication List    TAKE these medications       albuterol 108 (90 BASE) MCG/ACT inhaler  Commonly known as:  PROVENTIL HFA;VENTOLIN HFA  Inhale 2 puffs into the lungs every 6 (six) hours as needed. For wheezing     amLODipine 10 MG tablet  Commonly known as:  NORVASC  Take 1 tablet (10 mg total) by mouth daily.     aspirin EC 81 MG tablet  Take 81 mg by mouth daily.     clopidogrel 75 MG tablet  Commonly known as:  PLAVIX  Take 75 mg by mouth daily.     gabapentin 100 MG capsule  Commonly known as:  NEURONTIN  Take 300 mg by mouth at bedtime.     hydrochlorothiazide 25 MG tablet  Commonly known as:  HYDRODIURIL  Take 25 mg by mouth daily.     iron polysaccharides 150 MG capsule  Commonly known as:  NIFEREX  Take 150 mg by mouth 2 (two) times daily.     meclizine 25 MG tablet  Commonly known as:  ANTIVERT  Take 25 mg by mouth 2 (two) times daily as needed. FOR DIZZINESS     metoprolol succinate 50 MG 24 hr tablet  Commonly known as:  TOPROL-XL  Take 50 mg by mouth daily. Take with or immediately following a meal.     sertraline 50 MG tablet  Commonly known as:  ZOLOFT  Take 50 mg by mouth daily.     simvastatin 40 MG tablet  Commonly known as:  ZOCOR  Take 1 tablet (40 mg total) by mouth at bedtime.     traMADol 50 MG tablet  Commonly known as:  ULTRAM  Take 50 mg by mouth every 6 (six) hours as needed. For pain     traZODone 50 MG tablet  Commonly known as:  DESYREL  Take 50 mg by mouth at bedtime.            Gildardo Cranker, DO of [name] 08/30/2012 9:56 AM

## 2012-09-02 ENCOUNTER — Inpatient Hospital Stay: Payer: Medicare Other | Admitting: Family Medicine

## 2012-09-09 ENCOUNTER — Encounter (HOSPITAL_COMMUNITY): Payer: Self-pay

## 2012-09-09 ENCOUNTER — Ambulatory Visit: Payer: Medicare Other | Admitting: Family Medicine

## 2012-09-09 ENCOUNTER — Emergency Department (HOSPITAL_COMMUNITY): Payer: Medicare Other

## 2012-09-09 ENCOUNTER — Emergency Department (HOSPITAL_COMMUNITY)
Admission: EM | Admit: 2012-09-09 | Discharge: 2012-09-09 | Disposition: A | Discharge status: Home / Self Care | Payer: Medicare Other | Attending: Emergency Medicine | Admitting: Emergency Medicine

## 2012-09-09 DIAGNOSIS — Z9889 Other specified postprocedural states: Secondary | ICD-10-CM | POA: Insufficient documentation

## 2012-09-09 DIAGNOSIS — Z7982 Long term (current) use of aspirin: Secondary | ICD-10-CM | POA: Insufficient documentation

## 2012-09-09 DIAGNOSIS — G4730 Sleep apnea, unspecified: Secondary | ICD-10-CM | POA: Insufficient documentation

## 2012-09-09 DIAGNOSIS — Z8673 Personal history of transient ischemic attack (TIA), and cerebral infarction without residual deficits: Secondary | ICD-10-CM | POA: Insufficient documentation

## 2012-09-09 DIAGNOSIS — G589 Mononeuropathy, unspecified: Secondary | ICD-10-CM | POA: Insufficient documentation

## 2012-09-09 DIAGNOSIS — I129 Hypertensive chronic kidney disease with stage 1 through stage 4 chronic kidney disease, or unspecified chronic kidney disease: Secondary | ICD-10-CM | POA: Insufficient documentation

## 2012-09-09 DIAGNOSIS — G43909 Migraine, unspecified, not intractable, without status migrainosus: Secondary | ICD-10-CM | POA: Insufficient documentation

## 2012-09-09 DIAGNOSIS — Z8719 Personal history of other diseases of the digestive system: Secondary | ICD-10-CM | POA: Insufficient documentation

## 2012-09-09 DIAGNOSIS — Z7902 Long term (current) use of antithrombotics/antiplatelets: Secondary | ICD-10-CM | POA: Insufficient documentation

## 2012-09-09 DIAGNOSIS — Z79899 Other long term (current) drug therapy: Secondary | ICD-10-CM | POA: Insufficient documentation

## 2012-09-09 DIAGNOSIS — Z8669 Personal history of other diseases of the nervous system and sense organs: Secondary | ICD-10-CM | POA: Insufficient documentation

## 2012-09-09 DIAGNOSIS — M6281 Muscle weakness (generalized): Secondary | ICD-10-CM | POA: Insufficient documentation

## 2012-09-09 DIAGNOSIS — N189 Chronic kidney disease, unspecified: Secondary | ICD-10-CM | POA: Insufficient documentation

## 2012-09-09 DIAGNOSIS — E785 Hyperlipidemia, unspecified: Secondary | ICD-10-CM | POA: Insufficient documentation

## 2012-09-09 DIAGNOSIS — R29898 Other symptoms and signs involving the musculoskeletal system: Secondary | ICD-10-CM

## 2012-09-09 LAB — CBC WITH DIFFERENTIAL/PLATELET
Basophils Absolute: 0 10*3/uL (ref 0.0–0.1)
Basophils Relative: 0 % (ref 0–1)
Eosinophils Absolute: 0.3 10*3/uL (ref 0.0–0.7)
Eosinophils Relative: 4 % (ref 0–5)
HCT: 31.9 % — ABNORMAL LOW (ref 36.0–46.0)
Hemoglobin: 11.4 g/dL — ABNORMAL LOW (ref 12.0–15.0)
Lymphocytes Relative: 43 % (ref 12–46)
Lymphs Abs: 3.3 10*3/uL (ref 0.7–4.0)
MCH: 29.2 pg (ref 26.0–34.0)
MCHC: 35.7 g/dL (ref 30.0–36.0)
MCV: 81.6 fL (ref 78.0–100.0)
Monocytes Absolute: 0.5 10*3/uL (ref 0.1–1.0)
Monocytes Relative: 7 % (ref 3–12)
Neutro Abs: 3.6 10*3/uL (ref 1.7–7.7)
Neutrophils Relative %: 46 % (ref 43–77)
Platelets: 274 10*3/uL (ref 150–400)
RBC: 3.91 MIL/uL (ref 3.87–5.11)
RDW: 16.5 % — ABNORMAL HIGH (ref 11.5–15.5)
WBC: 7.8 10*3/uL (ref 4.0–10.5)

## 2012-09-09 LAB — URINALYSIS, ROUTINE W REFLEX MICROSCOPIC
Bilirubin Urine: NEGATIVE
Glucose, UA: NEGATIVE mg/dL
Hgb urine dipstick: NEGATIVE
Ketones, ur: NEGATIVE mg/dL
Leukocytes, UA: NEGATIVE
Nitrite: NEGATIVE
Protein, ur: 30 mg/dL — AB
Specific Gravity, Urine: 1.007 (ref 1.005–1.030)
Urobilinogen, UA: 0.2 mg/dL (ref 0.0–1.0)
pH: 7 (ref 5.0–8.0)

## 2012-09-09 LAB — BASIC METABOLIC PANEL
BUN: 10 mg/dL (ref 6–23)
CO2: 26 mEq/L (ref 19–32)
Calcium: 9.2 mg/dL (ref 8.4–10.5)
Chloride: 102 mEq/L (ref 96–112)
Creatinine, Ser: 1.14 mg/dL — ABNORMAL HIGH (ref 0.50–1.10)
GFR calc Af Amer: 64 mL/min — ABNORMAL LOW (ref >90)
GFR calc non Af Amer: 55 mL/min — ABNORMAL LOW (ref >90)
Glucose, Bld: 93 mg/dL (ref 70–99)
Potassium: 3.9 mEq/L (ref 3.5–5.1)
Sodium: 136 mEq/L (ref 135–145)

## 2012-09-09 LAB — URINE MICROSCOPIC-ADD ON

## 2012-09-09 NOTE — ED Provider Notes (Signed)
History     CSN: [id number]  Arrival date & time 09/09/12  1056   First MD Initiated Contact with Patient 09/09/12 1107      Chief Complaint  Patient presents with  . Hypertension    (Consider location/radiation/quality/duration/timing/severity/associated sxs/prior treatment) HPI Patient presents emergency department with weakness in her left foot, weakness, she states she felt like her left foot was not working correctly.  Patient, states, that she did not have any nausea, vomiting, blurred vision, chest pain, shortness of breath, diarrhea, or fever.  Patient, states, that she feels, like her symptoms have improved.  Patient has been worked up in the past for similar symptoms.  Patient denies anything makes her condition, better or worse Past Medical History  Diagnosis Date  . Hypertension   . Neuropathy   . Chiari I malformation   . Hyperlipidemia   . CVA (cerebral infarction)   . Migraine   . Daily headache     "lately" (05/18/12)  . GERD (gastroesophageal reflux disease)   . Chronic kidney disease     not on dialysis  . Stroke 2008;  ?2009;     7 strokes, L sided deficits  . Sleep apnea     USES CPAP    Past Surgical History  Procedure Laterality Date  . Reduction mammaplasty  ~ 2002  . Cesarean section  1478; 1983; 1989  . Craniectomy suboccipital w/ cervical laminectomy / chiari  2000's  . Brain surgery      Family History  Problem Relation Age of Onset  . Hyperlipidemia Mother   . Heart disease Mother   . Hypertension Mother   . Heart disease Sister   . Heart disease Brother     History  Substance Use Topics  . Smoking status: Never Smoker   . Smokeless tobacco: Never Used  . Alcohol Use: No    OB History   Grav Para Term Preterm Abortions TAB SAB Ect Mult Living                  Review of Systems All other systems negative except as documented in the HPI. All pertinent positives and negatives as reviewed in the HPI. Allergies  Iodine  Home  Medications   Current Outpatient Rx  Name  Route  Sig  Dispense  Refill  . acetaminophen (TYLENOL) 500 MG tablet   Oral   Take 1,000 mg by mouth every 6 (six) hours as needed for pain.         Marland Kitchen albuterol (PROVENTIL HFA;VENTOLIN HFA) 108 (90 BASE) MCG/ACT inhaler   Inhalation   Inhale 2 puffs into the lungs every 6 (six) hours as needed for shortness of breath.          Marland Kitchen amLODipine (NORVASC) 10 MG tablet   Oral   Take 1 tablet (10 mg total) by mouth daily.   30 tablet   0   . aspirin EC 81 MG tablet   Oral   Take 81 mg by mouth daily.         Marland Kitchen atorvastatin (LIPITOR) 40 MG tablet   Oral   Take 40 mg by mouth every evening.         . clopidogrel (PLAVIX) 75 MG tablet   Oral   Take 75 mg by mouth daily.         Marland Kitchen gabapentin (NEURONTIN) 100 MG capsule   Oral   Take 300 mg by mouth at bedtime.          Marland Kitchen  hydrochlorothiazide (HYDRODIURIL) 25 MG tablet   Oral   Take 25 mg by mouth daily.         . iron polysaccharides (NIFEREX) 150 MG capsule   Oral   Take 150 mg by mouth 2 (two) times daily.         . meclizine (ANTIVERT) 25 MG tablet   Oral   Take 25 mg by mouth 2 (two) times daily as needed. FOR DIZZINESS         . metoprolol succinate (TOPROL-XL) 50 MG 24 hr tablet   Oral   Take 50 mg by mouth daily. Take with or immediately following a meal.         . metoprolol tartrate (LOPRESSOR) 25 MG tablet   Oral   Take 25 mg by mouth 2 (two) times daily.         . sertraline (ZOLOFT) 50 MG tablet   Oral   Take 50 mg by mouth daily.         . simvastatin (ZOCOR) 40 MG tablet   Oral   Take 1 tablet (40 mg total) by mouth at bedtime.   90 tablet   3   . traMADol (ULTRAM) 50 MG tablet   Oral   Take 50 mg by mouth every 6 (six) hours as needed. For pain         . traZODone (DESYREL) 50 MG tablet   Oral   Take 50 mg by mouth at bedtime.           BP 189/86  Pulse 82  Temp(Src) 98.9 F (37.2 C) (Oral)  SpO2 100%  Physical Exam   Nursing note and vitals reviewed. Constitutional: She is oriented to person, place, and time. She appears well-developed and well-nourished. No distress.  HENT:  Head: Normocephalic and atraumatic.  Mouth/Throat: Oropharynx is clear and moist.  Eyes: Pupils are equal, round, and reactive to light.  Neck: Normal range of motion. Neck supple.  Cardiovascular: Normal rate, regular rhythm and normal heart sounds.  Exam reveals no gallop and no friction rub.   No murmur heard. Pulmonary/Chest: Effort normal and breath sounds normal. No respiratory distress.  Abdominal: Soft. Bowel sounds are normal.  Neurological: She is alert and oriented to person, place, and time. She has normal strength. No sensory deficit. She exhibits normal muscle tone. Coordination normal. GCS eye subscore is 4. GCS verbal subscore is 5. GCS motor subscore is 6.  Skin: Skin is warm and dry. No rash noted.    ED Course  Procedures (including critical care time)  Labs Reviewed  CBC WITH DIFFERENTIAL - Abnormal; Notable for the following:    Hemoglobin 11.4 (*)    HCT 31.9 (*)    RDW 16.5 (*)    All other components within normal limits  BASIC METABOLIC PANEL - Abnormal; Notable for the following:    Creatinine, Ser 1.14 (*)    GFR calc non Af Amer 55 (*)    GFR calc Af Amer 64 (*)    All other components within normal limits  URINALYSIS, ROUTINE W REFLEX MICROSCOPIC - Abnormal; Notable for the following:    Protein, ur 30 (*)    All other components within normal limits  URINE MICROSCOPIC-ADD ON - Abnormal; Notable for the following:    Squamous Epithelial / LPF FEW (*)    All other components within normal limits   Ct Head Wo Contrast  09/09/2012  *RADIOLOGY REPORT*  Clinical Data: Numbness left side, dragging left  leg, history stroke, hypertension, hyperlipidemia  CT HEAD WITHOUT CONTRAST  Technique:  Contiguous axial images were obtained from the base of the skull through the vertex without contrast.   Comparison: 08/28/2012  Findings: Normal ventricular morphology. No midline shift or mass effect. Old infarcts identified at the anterior aspects of the basal ganglia and anterior limbs of the internal capsules bilaterally. Additional old right thalamic lacunar infarcts. Small vessel chronic ischemic changes deep cerebral white matter. Prior suboccipital craniotomy and foramen magnum decompression. No intracranial hemorrhage, mass lesion, or evidence of acute infarction. Bones and sinuses unremarkable.  IMPRESSION: Prior suboccipital craniotomy and foramen magnum decompression. Small vessel chronic ischemic changes of deep cerebral white matter. Multiple stable old lacunar infarcts. No acute intracranial abnormalities.   Original Report Authenticated By: Ulyses Southward, M.D.    Return here for any worsening in your condition.  Patient will be referred back to her primary care Dr. as her symptoms have resolved at this time.  Patient has been worked up extensively for these weak symptoms.  The patient is advised to followup with her primary care Dr. as soon as possible.  Patient is currently taking Plavix and aspirin.  She was recently in the hospital 2 weeks ago for the symptoms.   MDM   MDM Reviewed: vitals, nursing note and previous chart Reviewed previous: labs, ECG, x-ray and CT scan Interpretation: CT scan and labs           Carlyle Dolly, PA-C 09/09/12 1506

## 2012-09-09 NOTE — ED Provider Notes (Signed)
Medical screening examination/treatment/procedure(s) were performed by non-physician practitioner and as supervising physician I was immediately available for consultation/collaboration.  Juliet Rude. Rubin Payor, MD 09/09/12 [phone]

## 2012-09-09 NOTE — ED Notes (Addendum)
Pt.  Having a headache and hypertension since 0900 am. She also reported lt. Sided weakness.  Pt. Ambulated independently to the ambulance. No s/s of lt. Side weakness while ambulating to the ambulance.  Pt. Is alert and oriented.  She also has HX. Ofvertigo and headache. Pt. Is alert and oriented X4. No neuro deficits noted by [facility],.

## 2012-09-09 NOTE — ED Notes (Signed)
Patient transported to CT 

## 2013-04-24 ENCOUNTER — Ambulatory Visit: Payer: Medicare Other | Admitting: Family Medicine

## 2013-07-03 ENCOUNTER — Emergency Department (HOSPITAL_COMMUNITY): Payer: Medicare Other

## 2013-07-03 ENCOUNTER — Encounter (HOSPITAL_COMMUNITY): Payer: Self-pay | Admitting: Emergency Medicine

## 2013-07-03 ENCOUNTER — Emergency Department (HOSPITAL_COMMUNITY)
Admission: EM | Admit: 2013-07-03 | Discharge: 2013-07-03 | Disposition: A | Discharge status: Home / Self Care | Payer: Medicare Other | Attending: Emergency Medicine | Admitting: Emergency Medicine

## 2013-07-03 DIAGNOSIS — M79609 Pain in unspecified limb: Secondary | ICD-10-CM | POA: Insufficient documentation

## 2013-07-03 DIAGNOSIS — I129 Hypertensive chronic kidney disease with stage 1 through stage 4 chronic kidney disease, or unspecified chronic kidney disease: Secondary | ICD-10-CM | POA: Insufficient documentation

## 2013-07-03 DIAGNOSIS — Z7982 Long term (current) use of aspirin: Secondary | ICD-10-CM | POA: Insufficient documentation

## 2013-07-03 DIAGNOSIS — G4730 Sleep apnea, unspecified: Secondary | ICD-10-CM | POA: Insufficient documentation

## 2013-07-03 DIAGNOSIS — I69998 Other sequelae following unspecified cerebrovascular disease: Secondary | ICD-10-CM | POA: Insufficient documentation

## 2013-07-03 DIAGNOSIS — Z79899 Other long term (current) drug therapy: Secondary | ICD-10-CM | POA: Insufficient documentation

## 2013-07-03 DIAGNOSIS — Z7902 Long term (current) use of antithrombotics/antiplatelets: Secondary | ICD-10-CM | POA: Insufficient documentation

## 2013-07-03 DIAGNOSIS — N390 Urinary tract infection, site not specified: Secondary | ICD-10-CM | POA: Insufficient documentation

## 2013-07-03 DIAGNOSIS — N189 Chronic kidney disease, unspecified: Secondary | ICD-10-CM | POA: Insufficient documentation

## 2013-07-03 DIAGNOSIS — E785 Hyperlipidemia, unspecified: Secondary | ICD-10-CM | POA: Insufficient documentation

## 2013-07-03 DIAGNOSIS — M25569 Pain in unspecified knee: Secondary | ICD-10-CM | POA: Insufficient documentation

## 2013-07-03 DIAGNOSIS — G43909 Migraine, unspecified, not intractable, without status migrainosus: Secondary | ICD-10-CM | POA: Insufficient documentation

## 2013-07-03 DIAGNOSIS — Z8669 Personal history of other diseases of the nervous system and sense organs: Secondary | ICD-10-CM | POA: Insufficient documentation

## 2013-07-03 DIAGNOSIS — Z8719 Personal history of other diseases of the digestive system: Secondary | ICD-10-CM | POA: Insufficient documentation

## 2013-07-03 LAB — URINE MICROSCOPIC-ADD ON

## 2013-07-03 LAB — CBC WITH DIFFERENTIAL/PLATELET
Eosinophils Relative: 1 % (ref 0–5)
Hemoglobin: 10.9 g/dL — ABNORMAL LOW (ref 12.0–15.0)
Lymphocytes Relative: 26 % (ref 12–46)
Lymphs Abs: 2.5 10*3/uL (ref 0.7–4.0)
MCH: 29.2 pg (ref 26.0–34.0)
MCV: 82.6 fL (ref 78.0–100.0)
Monocytes Relative: 9 % (ref 3–12)
Neutrophils Relative %: 64 % (ref 43–77)
Platelets: 249 10*3/uL (ref 150–400)
RBC: 3.73 MIL/uL — ABNORMAL LOW (ref 3.87–5.11)
WBC: 9.6 10*3/uL (ref 4.0–10.5)

## 2013-07-03 LAB — URINALYSIS, ROUTINE W REFLEX MICROSCOPIC
Ketones, ur: NEGATIVE mg/dL
Nitrite: NEGATIVE
Protein, ur: 100 mg/dL — AB
Urobilinogen, UA: 1 mg/dL (ref 0.0–1.0)

## 2013-07-03 LAB — COMPREHENSIVE METABOLIC PANEL
ALT: 28 U/L (ref 0–35)
Alkaline Phosphatase: 61 U/L (ref 39–117)
BUN: 16 mg/dL (ref 6–23)
CO2: 26 mEq/L (ref 19–32)
GFR calc Af Amer: 53 mL/min — ABNORMAL LOW (ref >90)
GFR calc non Af Amer: 46 mL/min — ABNORMAL LOW (ref >90)
Glucose, Bld: 99 mg/dL (ref 70–99)
Potassium: 3.3 mEq/L — ABNORMAL LOW (ref 3.5–5.1)
Sodium: 141 mEq/L (ref 135–145)

## 2013-07-03 LAB — TROPONIN I: Troponin I: <0.3 ng/mL (ref <0.30)

## 2013-07-03 MED ORDER — CIPROFLOXACIN HCL 250 MG PO TABS: 250.0000 mg | ORAL_TABLET | Freq: Two times a day (BID) | ORAL | Status: DC
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
## 2013-07-03 NOTE — ED Provider Notes (Signed)
Medical screening examination/treatment/procedure(s) were performed by non-physician practitioner and as supervising physician I was immediately available for consultation/collaboration.  EKG Interpretation   None         Maryjo Ragon N Alexina Niccoli, DO 07/03/13 1648 

## 2013-07-03 NOTE — ED Notes (Signed)
Per EMS: pt c/ right arm and knee pain with movement and palpitation x 1 week. When EMS arrived pt had malodorous smell of "ammonia". Pt normally able to walk but son states she hasn't been today. Has not been taking meds for about 3 weeks per son because the way it makes her feel. Son also states she has been "spacy".

## 2013-07-03 NOTE — ED Notes (Signed)
Bed: WA17 Expected date:  Expected time:  Means of arrival:  Comments: EMS-[facility]I 

## 2013-07-03 NOTE — ED Notes (Signed)
Patient out of room will perform EKG when she returns

## 2013-07-03 NOTE — ED Notes (Signed)
Patient transported to X-ray 

## 2013-07-03 NOTE — Progress Notes (Signed)
   CARE MANAGEMENT ED NOTE 07/03/2013  Patient:  BLASA, RAISCH   Account Number:  [id number]  Date Initiated:  07/03/2013  Documentation initiated by:  Edd Arbour  Subjective/Objective Assessment:   52 yr old female medicare pt pt c/o right arm & knee pain with movement & palpitation x 1 week. When EMS arrived pt had malodorous smell of "ammonia". Pt normally able to walk but son states she hasn't been today. Has not been taking med     Subjective/Objective Assessment Detail:   ED RN, Rock Nephew spoke with ED CM about daughter's calls to [facility] ED voicing concern about pt's son's care of her at home States pt needs to be in a facility Per RN daughter is interested in [facility]"  Pt informed ED CM she had no concerns with her care at home and did not want to go to a facility after CM discussed the concerns voiced to the ED RN by her daughter. Pt confirms she is seen by the [facility] residents confirmed with CM that rachel spiegel and amber hairford listed as previous pcps were residents & "probably gone by now     Action/Plan:   CM spoke with ED RN, ED CNA, pt and ED SW CM offered home health and private duty information but the pt refused   Action/Plan Detail:   Anticipated DC Date:  07/03/2013     Status Recommendation to Physician:   Result of Recommendation:    Other ED Services  Consult Working Plan    DC Planning Services  Other  Outpatient Services - Pt will follow up    Choice offered to / List presented to:            Status of service:  Completed, signed off  ED Comments:   ED Comments Detail:
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
## 2013-07-03 NOTE — ED Notes (Signed)
Called pt's son awaiting arrival to pick pt up.

## 2013-07-03 NOTE — ED Notes (Signed)
Attempted to In and out cath no urine return, nurse tech will retry in 5 mins.

## 2013-07-03 NOTE — Progress Notes (Signed)
CSW asked tech if patient could have ice, csw provided patient with ice. CSW also reminded patient they need urine sample.     Catha Gosselin, LCSW [phone]  ED CSW .07/03/2013 1517pm

## 2013-07-03 NOTE — ED Provider Notes (Signed)
  Physical Exam  BP 153/89  Pulse 107  Temp(Src) 98.3 F (36.8 C)  Resp 16  SpO2 100%  Physical Exam Awake and alert. Reassuring neuro exam, no focal weakness or deficits. Musculoskeletal pain.   ED Course  Procedures  MDM  Awaiting urine results. Chest and right knee and right forearm x-rays, negative.  Treated with Ciprofloxacin for UTI. Return precautions given. Pt understands and agrees to plan of care.        Irish Elders, NP 07/10/13 2119

## 2013-07-04 ENCOUNTER — Other Ambulatory Visit: Payer: Self-pay | Admitting: Internal Medicine

## 2013-07-18 ENCOUNTER — Ambulatory Visit (INDEPENDENT_AMBULATORY_CARE_PROVIDER_SITE_OTHER): Payer: Medicare Other | Admitting: Family Medicine

## 2013-07-18 ENCOUNTER — Encounter: Payer: Self-pay | Admitting: Family Medicine

## 2013-07-18 VITALS — BP 116/78 | HR 66 | Temp 97.9°F | Ht 59.0 in | Wt 147.0 lb

## 2013-07-18 DIAGNOSIS — R42 Dizziness and giddiness: Secondary | ICD-10-CM

## 2013-07-18 DIAGNOSIS — I10 Essential (primary) hypertension: Secondary | ICD-10-CM

## 2013-07-18 DIAGNOSIS — H8110 Benign paroxysmal vertigo, unspecified ear: Secondary | ICD-10-CM

## 2013-07-18 DIAGNOSIS — IMO0002 Reserved for concepts with insufficient information to code with codable children: Secondary | ICD-10-CM

## 2013-07-18 DIAGNOSIS — F329 Major depressive disorder, single episode, unspecified: Secondary | ICD-10-CM

## 2013-07-18 DIAGNOSIS — I6350 Cerebral infarction due to unspecified occlusion or stenosis of unspecified cerebral artery: Secondary | ICD-10-CM

## 2013-07-18 DIAGNOSIS — G479 Sleep disorder, unspecified: Secondary | ICD-10-CM

## 2013-07-18 DIAGNOSIS — E785 Hyperlipidemia, unspecified: Secondary | ICD-10-CM

## 2013-07-18 DIAGNOSIS — F3289 Other specified depressive episodes: Secondary | ICD-10-CM

## 2013-07-18 DIAGNOSIS — F32A Depression, unspecified: Secondary | ICD-10-CM

## 2013-07-18 MED ORDER — TRAZODONE 25 MG HALF TABLET: 25.0000 mg | ORAL_TABLET | Freq: Every day | ORAL | Status: DC

## 2013-07-18 MED ORDER — MECLIZINE HCL 25 MG PO TABS: 25.0000 mg | ORAL_TABLET | Freq: Two times a day (BID) | ORAL | Status: DC | PRN

## 2013-07-18 MED ORDER — CLOPIDOGREL BISULFATE 75 MG PO TABS: 75.0000 mg | ORAL_TABLET | Freq: Every day | ORAL | Status: DC

## 2013-07-18 MED ORDER — GABAPENTIN 100 MG PO CAPS: 300.0000 mg | ORAL_CAPSULE | Freq: Every day | ORAL | Status: DC

## 2013-07-18 MED ORDER — ATORVASTATIN CALCIUM 40 MG PO TABS: 40.0000 mg | ORAL_TABLET | Freq: Every evening | ORAL | Status: DC

## 2013-07-18 MED ORDER — HYDROCHLOROTHIAZIDE 25 MG PO TABS: 25.0000 mg | ORAL_TABLET | Freq: Every day | ORAL | Status: DC

## 2013-07-18 MED ORDER — SERTRALINE HCL 50 MG PO TABS: 50.0000 mg | ORAL_TABLET | Freq: Every day | ORAL | Status: DC

## 2013-07-18 MED ORDER — TRAMADOL HCL 50 MG PO TABS: 25.0000 mg | ORAL_TABLET | Freq: Four times a day (QID) | ORAL | Status: DC | PRN

## 2013-07-18 MED ORDER — METOPROLOL TARTRATE 25 MG PO TABS: 25.0000 mg | ORAL_TABLET | Freq: Two times a day (BID) | ORAL | Status: DC

## 2013-07-18 NOTE — Patient Instructions (Addendum)
STOP taking: Amlodipine and Simvastatin  Start taking: 1/2 tablet of Trazodone instead of a full time.   Continue all other medications  See Dr. Mikel CellaHairford in about 1 month (schedule today). I want you to discuss with her if you should be on both aspirin and plavix but continue these for now.  Dr. Durene CalHunter

## 2013-07-20 NOTE — Assessment & Plan Note (Signed)
Patient was taking both simvastatin and atorvastatin. I asked patient to stop the simvastatin and continue only on the atorvastatin.

## 2013-07-20 NOTE — Assessment & Plan Note (Addendum)
Patient's blood pressure and cholesterol are on appropriate therapy. She is on aspirin and plavix. I am not sure the reason for this combination and have asked patient to return to discuss with PCP-definitely would continue plavix but not sure if she has indication for aspirin as well at this time.

## 2013-07-20 NOTE — Assessment & Plan Note (Signed)
Well controlled. Continue current meds: amlodipine, hctz, metoprolol

## 2013-07-20 NOTE — Progress Notes (Signed)
Lindsey ConchStephen Olis Viverette, MD Phone: [phone]  Subjective:  Chief complaint-noted  Patient presents for follow up of multiple medical problems (history of CVA/TIA, hypertension, hyperlipidemiavertigo presumed BPPH, and low back pain). Specifically she is requesting a medication review. Her only complaint today is that she thinks her trazodone is too strong. She is still able to sleep but it makes her less active during the day and states sometimes gets slightly confused in the mornings. She and her son would like to decrease the dose of this medicine. Regarding blood prssure, does not take at home but is taking her medicine. She appears to be taking 2 statin medications for her hyperlipidemia. She is taking both aspirin and plavix for her history of CVA. Regarding low back pain, states well controlled on tramadol. Regarding vertigo, intermittently requires meclizine and would like a refill.   ROS-no chest pain or shortness of breath. Some vertigo.   Past Medical History Patient Active Problem List   Diagnosis Date Noted  . Numbness on left side 08/29/2012  . TIA (transient ischemic attack) 08/29/2012  . Medically noncompliant 07/06/2012  . BACK PAIN, LUMBAR, WITH RADICULOPATHY 07/23/2010  . SLEEP APNEA 05/06/2010  . OBESITY, MORBID 03/20/2010  . VERTIGO 11/05/2009  . HYPERLIPIDEMIA 10/18/2009  . Essential hypertension, benign 10/18/2009  . CVA 10/18/2009  . GASTROINTESTINAL DISORDER, FUNCTIONAL 10/18/2009  . ARNOLD-CHIARI MALFORMATION 10/18/2009  . CONTRAST DYE ALLERGY 10/18/2009    Medications- reviewed and updated Current Outpatient Prescriptions  Medication Sig Dispense Refill  . acetaminophen (TYLENOL) 500 MG tablet Take 1,000 mg by mouth every 6 (six) hours as needed for pain.      Marland Kitchen. albuterol (PROVENTIL HFA;VENTOLIN HFA) 108 (90 BASE) MCG/ACT inhaler Inhale 2 puffs into the lungs every 6 (six) hours as needed for shortness of breath.       Marland Kitchen. aspirin EC 81 MG tablet Take 81 mg by  mouth daily.      Marland Kitchen. atorvastatin (LIPITOR) 40 MG tablet Take 1 tablet (40 mg total) by mouth every evening.  30 tablet  5  . clopidogrel (PLAVIX) 75 MG tablet Take 1 tablet (75 mg total) by mouth daily.  30 tablet  5  . gabapentin (NEURONTIN) 100 MG capsule Take 3 capsules (300 mg total) by mouth at bedtime.  90 capsule  5  . hydrochlorothiazide (HYDRODIURIL) 25 MG tablet Take 1 tablet (25 mg total) by mouth daily.  30 tablet  5  . iron polysaccharides (NIFEREX) 150 MG capsule Take 150 mg by mouth 2 (two) times daily.      . meclizine (ANTIVERT) 25 MG tablet Take 1 tablet (25 mg total) by mouth 2 (two) times daily as needed. FOR DIZZINESS  30 tablet  2  . metoprolol tartrate (LOPRESSOR) 25 MG tablet Take 1 tablet (25 mg total) by mouth 2 (two) times daily.  60 tablet  5  . sertraline (ZOLOFT) 50 MG tablet Take 1 tablet (50 mg total) by mouth daily.  30 tablet  5  . traMADol (ULTRAM) 50 MG tablet Take 0.5 tablets (25 mg total) by mouth every 6 (six) hours as needed for moderate pain (not controlled by tylenol). For pain  15 tablet  0  . traZODone (DESYREL) 25 mg TABS tablet Take 0.5 tablets (25 mg total) by mouth at bedtime.  30 tablet  5   No current facility-administered medications for this visit.    Objective: BP 116/78  Pulse 66  Temp(Src) 97.9 F (36.6 C) (Oral)  Ht 4\' 11"  (1.499 m)  Wt 147 lb (66.679 kg)  BMI 29.67 kg/m2 Gen: NAD, resting comfortably in wheelchair, brought in by her son CV: RRR no murmurs rubs or gallops Lungs: CTAB no crackles, wheeze, rhonchi  Ext: no edema Neuro: grossly normal, moves all extremities, 4/5 strength in left upper and lower extremities-I note slightly better strength on the right side but likely only 4+/5, symmetrical facial movements  Assessment/Plan:  HYPERLIPIDEMIA Patient was taking both simvastatin and atorvastatin. I asked patient to stop the simvastatin and continue only on the atorvastatin.   Essential hypertension, benign Well  controlled. Continue current meds: amlodipine, hctz, metoprolol    CVA Patient unclear of any residual deficits. She is wheelchair bound and her strength seems to be 4+/5 diffusely slightly L>R. Patient's blood pressure and cholesterol are on appropriate therapy. She is on aspirin and plavix. I am not sure the reason for this combination and have asked patient to return to discuss with PCP-definitely would continue plavix but not sure if she has indication for aspirin as well at this time.   I refilled all requested medicines and asked patient to follow up with Dr. Mikel Cella. Regarding patient's feelings about her trazodone (which patient states is for sleep), I have decreased it to 25mg  to see if that will help with the decreased daytime activity.   No orders of the defined types were placed in this encounter.    Meds ordered this encounter  Medications  . atorvastatin (LIPITOR) 40 MG tablet    Sig: Take 1 tablet (40 mg total) by mouth every evening.    Dispense:  30 tablet    Refill:  5  . clopidogrel (PLAVIX) 75 MG tablet    Sig: Take 1 tablet (75 mg total) by mouth daily.    Dispense:  30 tablet    Refill:  5  . gabapentin (NEURONTIN) 100 MG capsule    Sig: Take 3 capsules (300 mg total) by mouth at bedtime.    Dispense:  90 capsule    Refill:  5  . hydrochlorothiazide (HYDRODIURIL) 25 MG tablet    Sig: Take 1 tablet (25 mg total) by mouth daily.    Dispense:  30 tablet    Refill:  5  . meclizine (ANTIVERT) 25 MG tablet    Sig: Take 1 tablet (25 mg total) by mouth 2 (two) times daily as needed. FOR DIZZINESS    Dispense:  30 tablet    Refill:  2  . metoprolol tartrate (LOPRESSOR) 25 MG tablet    Sig: Take 1 tablet (25 mg total) by mouth 2 (two) times daily.    Dispense:  60 tablet    Refill:  5  . sertraline (ZOLOFT) 50 MG tablet    Sig: Take 1 tablet (50 mg total) by mouth daily.    Dispense:  30 tablet    Refill:  5  . traMADol (ULTRAM) 50 MG tablet    Sig: Take 0.5  tablets (25 mg total) by mouth every 6 (six) hours as needed for moderate pain (not controlled by tylenol). For pain    Dispense:  15 tablet    Refill:  0  . DISCONTD: traZODone (DESYREL) 25 mg TABS tablet    Sig: Take 0.5 tablets (25 mg total) by mouth at bedtime.    Dispense:  30 tablet    Refill:  5  . DISCONTD: traZODone (DESYREL) 25 mg TABS tablet    Sig: Take 0.5 tablets (25 mg total) by mouth at bedtime.  Dispense:  30 tablet    Refill:  5  . traZODone (DESYREL) 25 mg TABS tablet    Sig: Take 0.5 tablets (25 mg total) by mouth at bedtime.    Dispense:  30 tablet    Refill:  5

## 2013-08-12 ENCOUNTER — Other Ambulatory Visit (HOSPITAL_COMMUNITY): Payer: Self-pay | Admitting: Family Medicine

## 2013-08-14 ENCOUNTER — Other Ambulatory Visit: Payer: Self-pay | Admitting: *Deleted

## 2013-08-14 DIAGNOSIS — I10 Essential (primary) hypertension: Secondary | ICD-10-CM

## 2013-08-14 MED ORDER — HYDROCHLOROTHIAZIDE 25 MG PO TABS: 25.0000 mg | ORAL_TABLET | Freq: Every day | ORAL | Status: DC

## 2014-05-01 ENCOUNTER — Telehealth: Payer: Self-pay | Admitting: Home Health Services

## 2014-05-01 NOTE — Telephone Encounter (Signed)
LVM to call and schedule fu appointment with PCP.

## 2014-05-19 IMAGING — CR DG HIP (WITH OR WITHOUT PELVIS) 2-3V*L*
3 series · 3 of 3 positions shown · non-contrast
Comparison: None.

CLINICAL DATA: Left hip pain after fall

LEFT HIP - COMPLETE 2+ VIEW

[t pelvis ap]
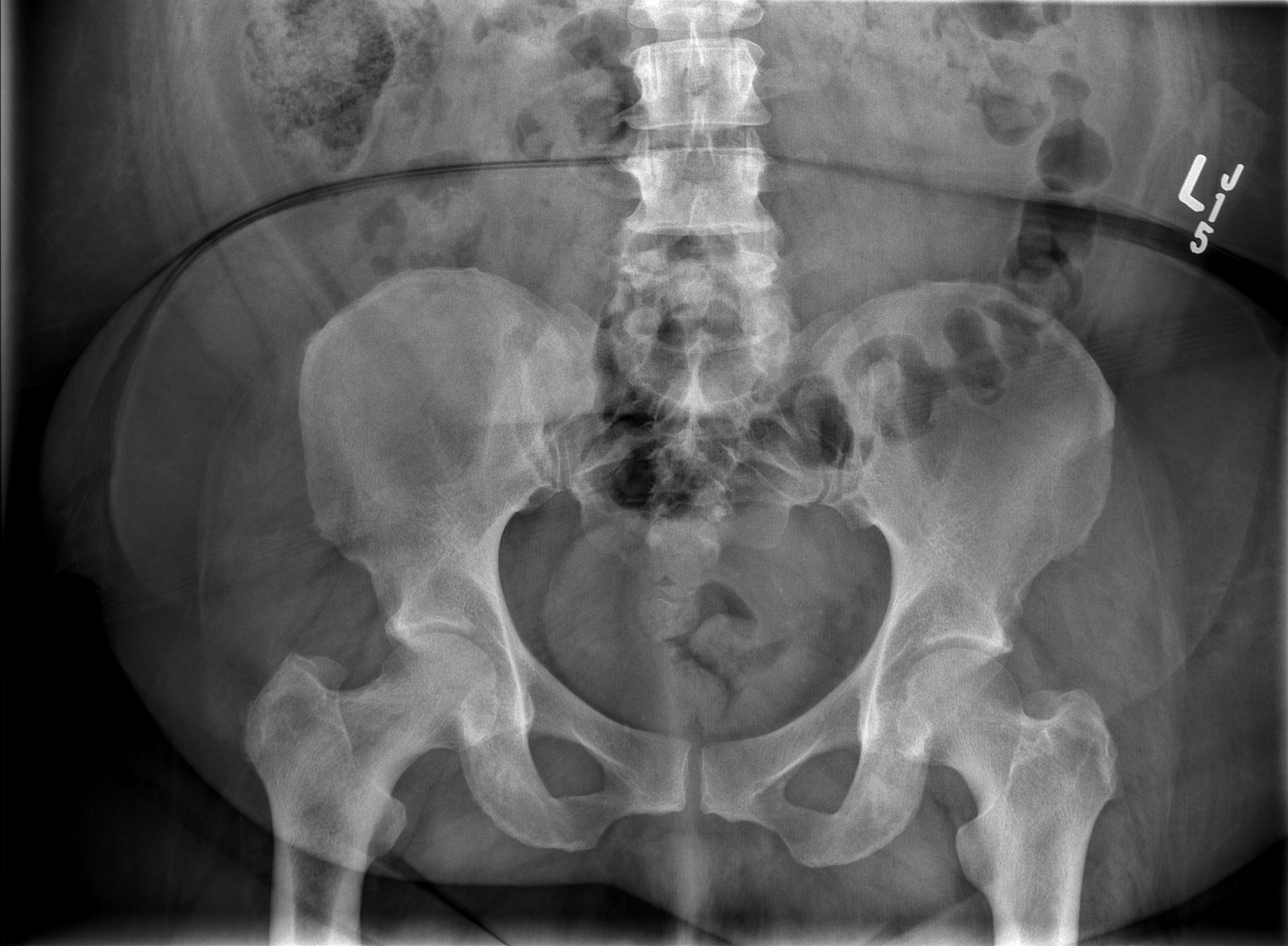

[t hip ap left]
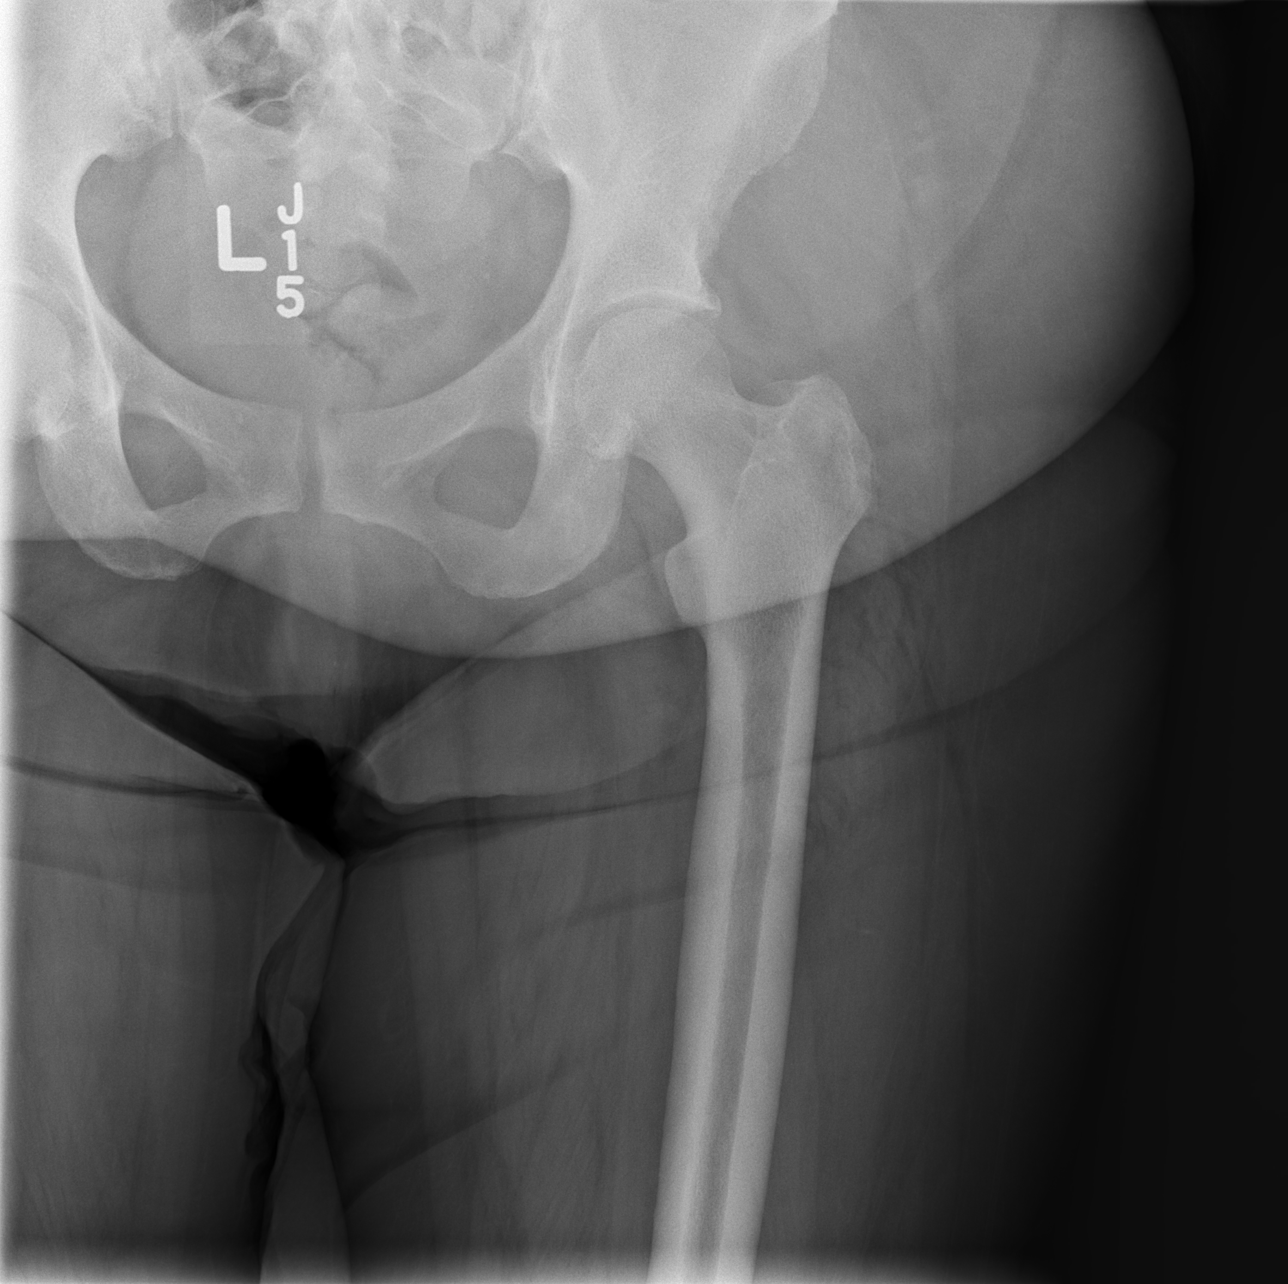

[t hip frog leg left]
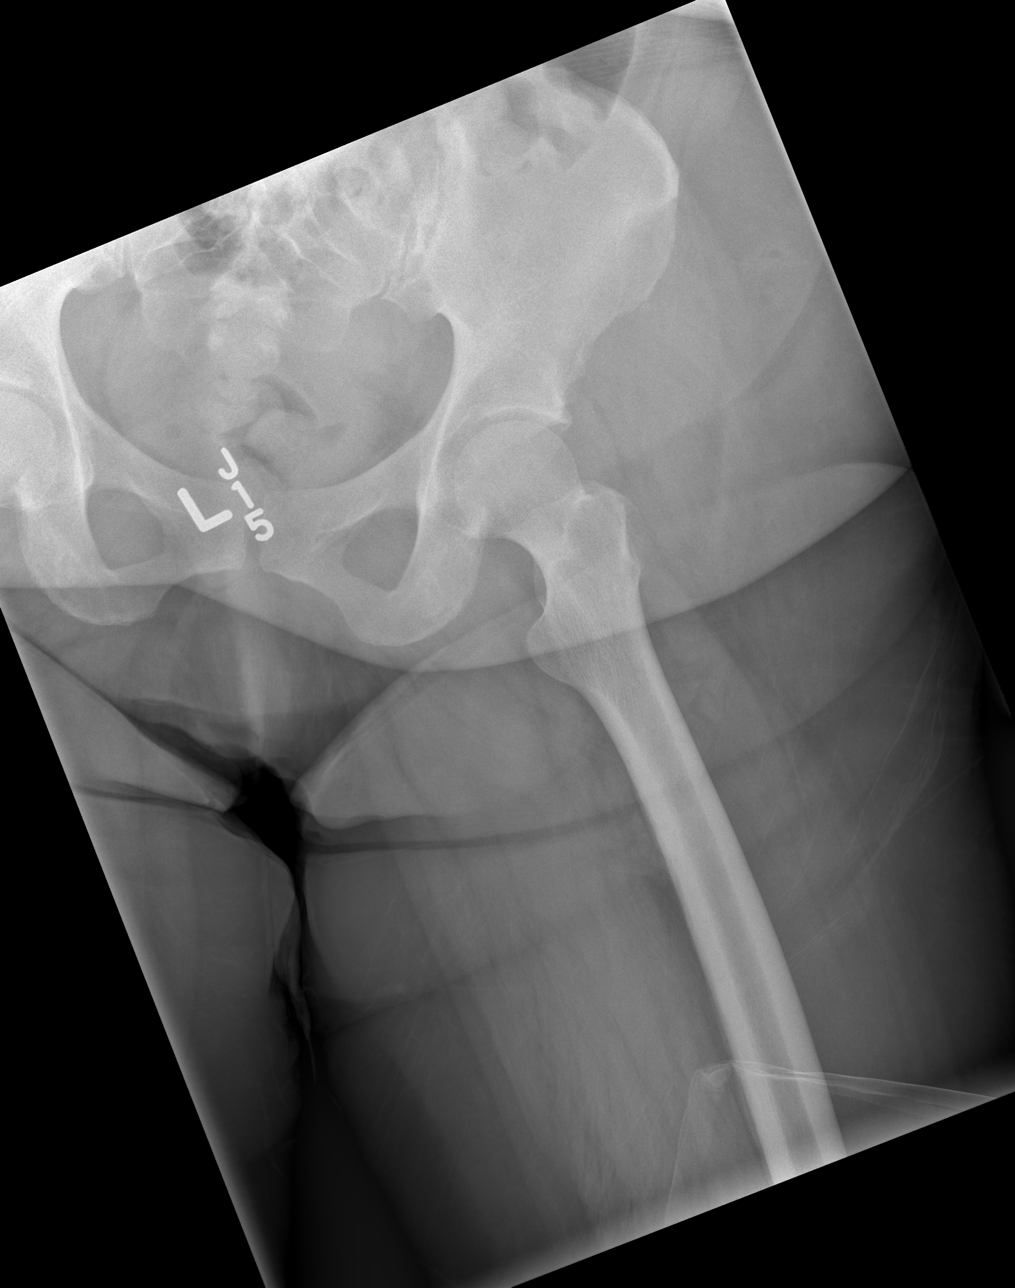

[3 of 3 positions shown; findings below may reference images not displayed]

FINDINGS: Frontal pelvis with AP and frog-leg lateral views of the
left hip show no fracture.  Joint space in the hips is relatively
well preserved and is symmetric.  There is no substantial
degenerative change in either hip.  SI joints and symphysis pubis
are unremarkable.  Arcuate lines of the sacrum are preserved.
IMPRESSION: No acute bony findings.  No evidence to explain the patient's
history of pain.

## 2014-06-11 IMAGING — XA IR ANGIO INTRA EXTRACRAN SEL COM CAROTID INNOMINATE BILAT MOD SE
1 series · 13 of 24 positions shown · IV contrast (IODINE)
Comparison: MRI/MRA of the brain of 06/15/2002.

CLINICAL DATA: Severe headaches.  TIAs.  Previous history of
ischemic strokes.  Abnormal MRA of the brain.

BILATERAL COMMON CAROTID ARTERIOGRAMS, LEFT VERTEBRAL ARTERY
ANGIOGRAM, AND RIGHT SUBCLAVIAN ARTERIOGRAM

[Series 300: neuro · 13 of 174 slices shown]
[im 1/174]
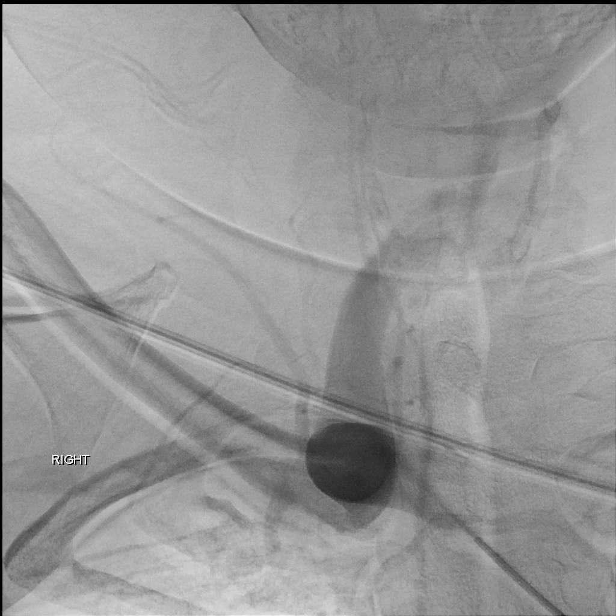
[im 16/174]
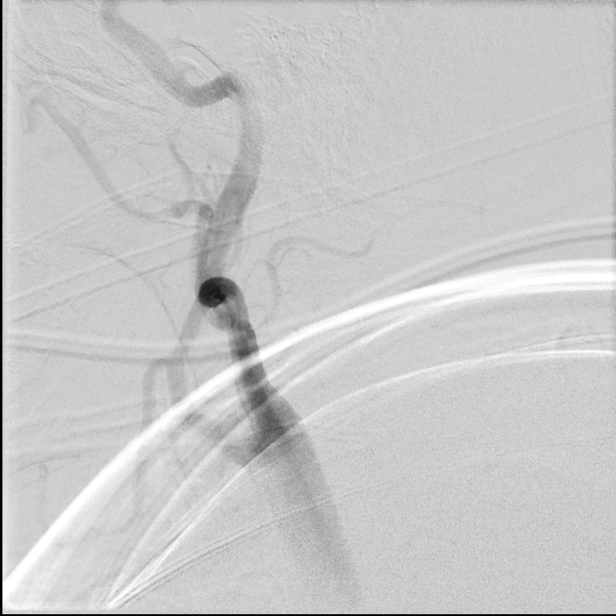
[im 31/174]
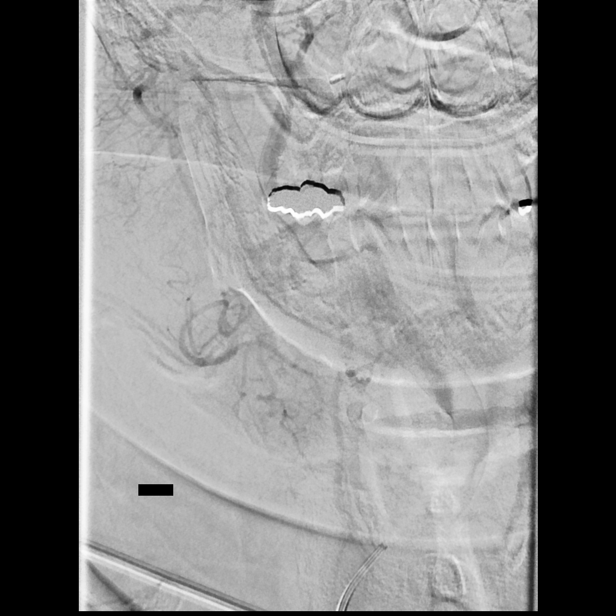
[im 46/174]
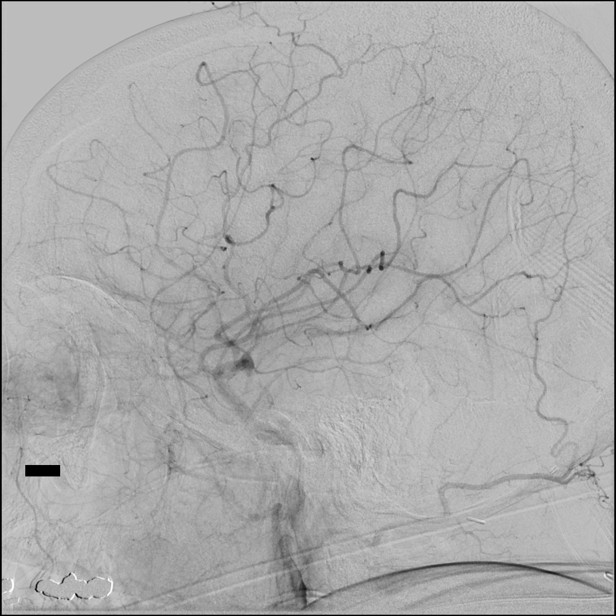
[im 61/174]
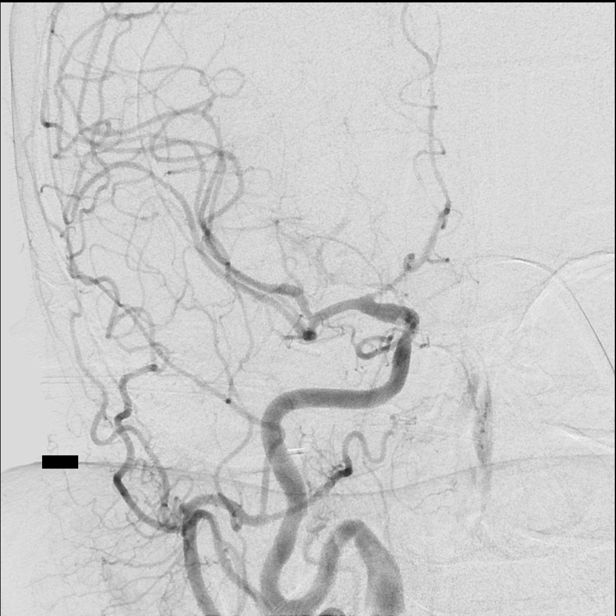
[im 76/174]
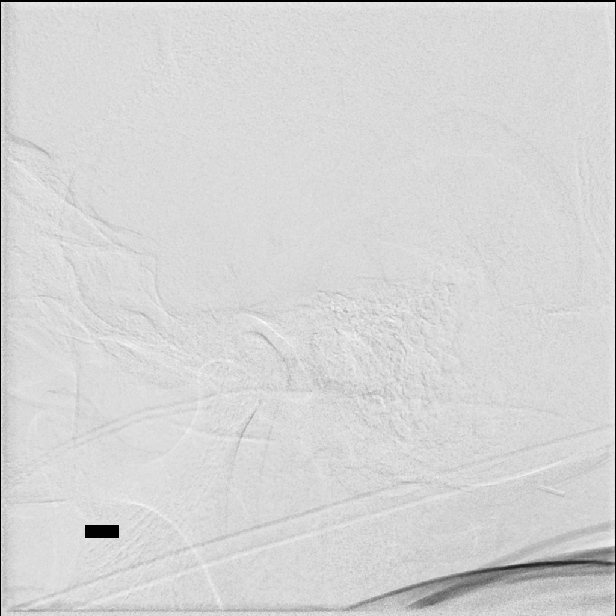
[im 91/174]
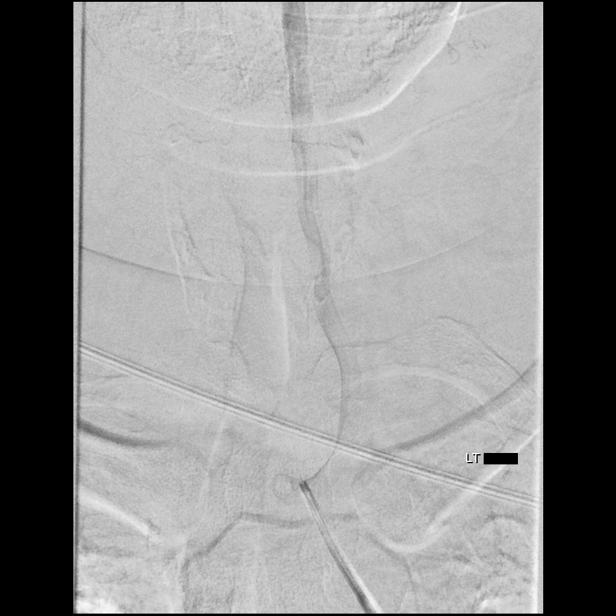
[im 98/174]
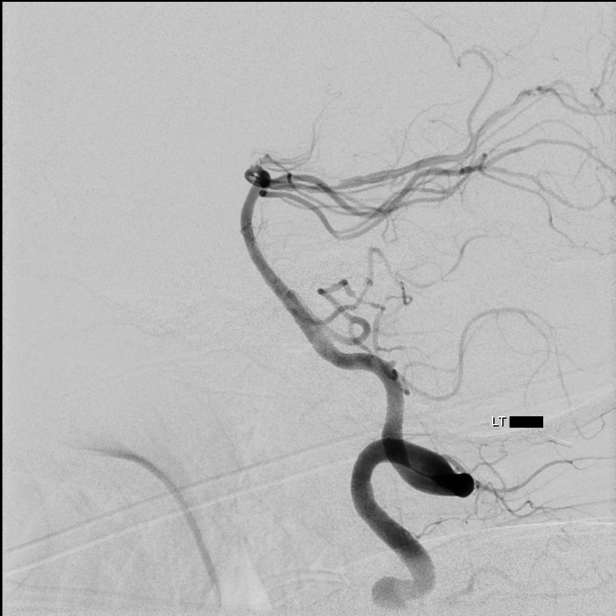
[im 113/174]
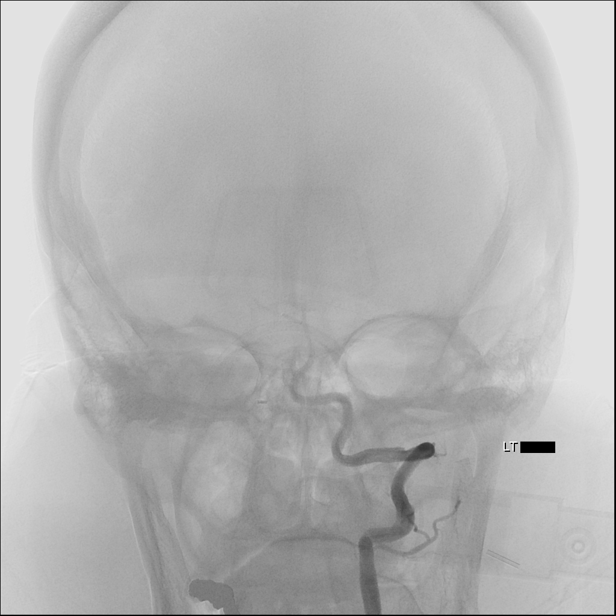
[im 128/174]
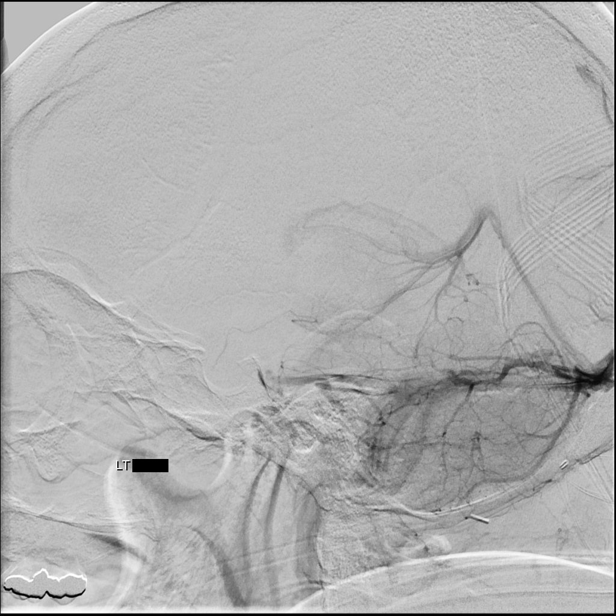
[im 143/174]
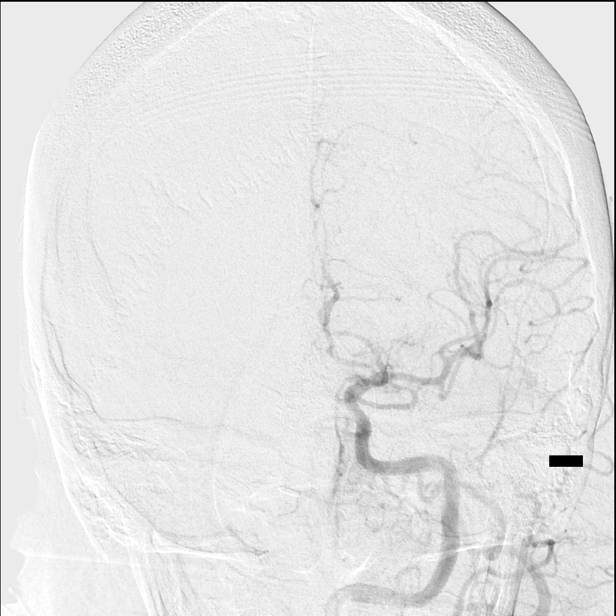
[im 158/174]
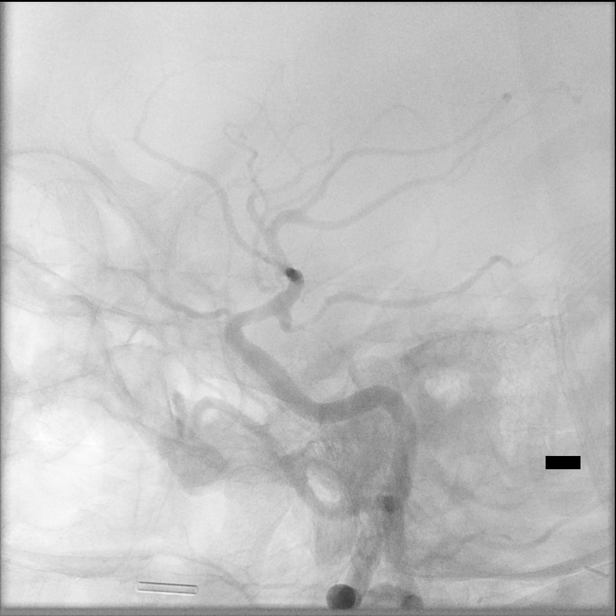
[im 174/174]
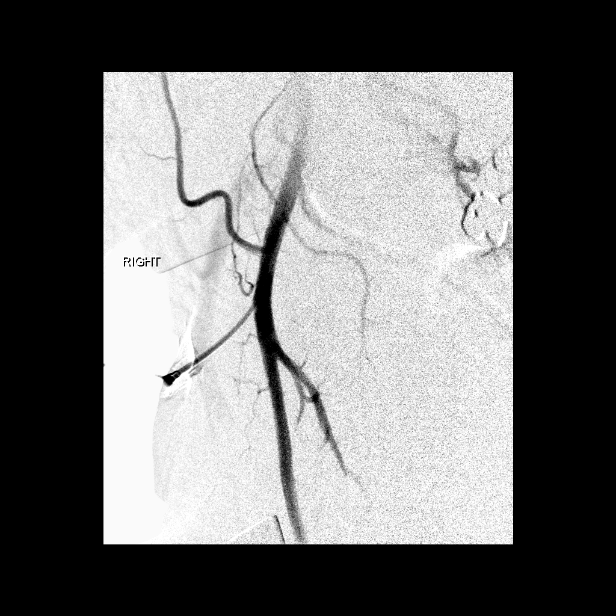

[13 of 24 positions shown; findings below may reference images not displayed]

Following a full explanation of the procedure along with the
potential associated complications, an informed witnessed consent
was obtained.

The right groin was prepped and draped in the usual sterile
fashion.  Thereafter using modified Seldinger technique,
transfemoral access into the right common femoral artery was
obtained without difficulty.  Over a 0.035-inch guidewire, a 5-
French Pinnacle sheath was inserted.  Through this and also over a
0.035-inch guidewire, a 5-French JB1 catheter was advanced to the
aortic arch region and selectively positioned in the right common
carotid artery, the right subclavian artery, the left common
carotid artery and the left vertebral artery.

There were no acute complications.  The patient tolerated the
procedure well.

Medications utilized:   Benadryl 25 mg IV.

Contrast: Imnipaque-JNN approximately 55 ml.
FINDINGS: The right common carotid arteriogram demonstrates the
right external carotid artery and its major branches to be normal.

The right internal carotid artery at the bulb has a smooth shallow
plaque along the posterior wall with approximately less than 20%
stenosis by NASCET criteria.

Moderate tortuosity is seen of the proximal third of the right
internal carotid artery.  The vessel distal to this opacifies
normally to the cranial skull base.

The petrous, cavernous and the supraclinoid segments are normal.

The right middle cerebral artery and the right anterior cerebral
artery opacify into capillary and venous phases.

Mild focal stenosis is seen involving the proximal aspect of the
superior division of the right middle cerebral artery and also of
the right anterior cerebral artery A1 segment.

A right posterior communicating artery is seen opacifying the right
posterior cerebral artery distribution.

The left vertebral artery arises from the aortic arch between the
origins of the left common carotid artery and the left subclavian
artery.

The vessel opacifies normally to the cranial skull base.

There is mild stenosis of the left vertebrobasilar junction
proximal to the hypoplastic left posterior inferior cerebellar
artery.

The basilar artery, the posterior cerebral arteries, the superior
cerebellar arteries and the anterior-inferior cerebellar arteries
opacify normally into capillary and venous phases.

Retrograde opacification into the right vertebrobasilar junction to
the right posterior-inferior cerebellar artery is seen.

The left common carotid arteriogram demonstrates the left external
carotid artery and its major branches to be normal.

The left internal carotid artery at the bulb is normal.  There is
mild tortuosity of the proximal third of the left internal carotid
artery.

More distally the vessel opacifies normally to the cranial skull
base.  The petrous, cavernous and the supraclinoid segments are
normal.

A left posterior communicating artery is seen opacifying the left
posterior cerebral artery distribution.

The left middle cerebral artery and the left anterior cerebral
artery opacify into capillary and venous phases.

Scattered focal areas of arteriosclerotic narrowing are seen
involving the pericallosal and callosal marginal branches of the
anterior cerebral artery, and the angular branch of the left middle
cerebral artery distally.
IMPRESSION: 1.  Angiographically occluded right vertebral artery at its origin,
with distal reconstitution of the hypoplastic right vertebrobasilar
junction to the right posterior inferior cerebellar artery.
2.  Scattered intracranial arteriosclerotic changes involving the
anterior circulation bilaterally as described above.
3. Mild atherosclerotic disease involving the right carotid bulb as
described.
4.  No angiographic evidence of intracranial aneurysms,
arteriovenous malformation or dural AV fistula seen.
5. Venous outflow within normal limits.

## 2014-08-23 ENCOUNTER — Other Ambulatory Visit: Payer: Self-pay | Admitting: *Deleted

## 2014-08-23 DIAGNOSIS — I2583 Coronary atherosclerosis due to lipid rich plaque: Secondary | ICD-10-CM

## 2014-08-23 DIAGNOSIS — I10 Essential (primary) hypertension: Secondary | ICD-10-CM

## 2014-08-23 DIAGNOSIS — I2510 Atherosclerotic heart disease of native coronary artery without angina pectoris: Secondary | ICD-10-CM

## 2014-08-24 MED ORDER — ASPIRIN EC 81 MG PO TBEC: 81.0000 mg | DELAYED_RELEASE_TABLET | Freq: Every day | ORAL | Status: DC

## 2014-08-24 MED ORDER — HYDROCHLOROTHIAZIDE 25 MG PO TABS: 25.0000 mg | ORAL_TABLET | Freq: Every day | ORAL | Status: DC

## 2014-08-24 MED ORDER — METOPROLOL SUCCINATE ER 50 MG PO TB24: ORAL_TABLET | ORAL | Status: DC

## 2014-08-24 NOTE — Telephone Encounter (Signed)
2nd request.  Khriz Liddy L, RN  

## 2014-10-25 ENCOUNTER — Other Ambulatory Visit: Payer: Self-pay | Admitting: Family Medicine

## 2014-11-01 ENCOUNTER — Other Ambulatory Visit: Payer: Self-pay | Admitting: Family Medicine

## 2014-11-01 NOTE — Telephone Encounter (Signed)
Please call patient to notify she needs appointment to f/u BP and recheck Creatinine but I have refilled metoprolol, HCTZ, and aspirin for 1 month.  Thank you!  Lindsey SingletonMaria T Lamees Gable, MD

## 2014-11-28 ENCOUNTER — Other Ambulatory Visit: Payer: Self-pay | Admitting: Family Medicine

## 2014-11-29 NOTE — Telephone Encounter (Signed)
Letter mailed to patient to schedule an appt. Jazmin Hartsell,CMA

## 2014-11-29 NOTE — Telephone Encounter (Signed)
Refilled all three (asa, metoprolol-XL, and HCTZ) but please call and have patient schedule follow up for HTN. Have made prior attempts to notify pt of need for f/u on refills with no visit yet. Lindsey SingletonMaria T Deysy Schabel, MD

## 2014-12-25 ENCOUNTER — Other Ambulatory Visit: Payer: Self-pay | Admitting: Family Medicine

## 2014-12-26 ENCOUNTER — Other Ambulatory Visit: Payer: Self-pay | Admitting: Family Medicine

## 2014-12-26 NOTE — Telephone Encounter (Signed)
Patient has an appt on 12-31-2014. Jazmin Hartsell,CMA

## 2014-12-26 NOTE — Telephone Encounter (Signed)
Filling metoprolol, HCTZ, and aspirin for 1 month but this is now the 2nd or 3rd time I am doing this and pt has still not been seen. Please call to help her set up an appt for f/u of BP. Leona Singleton, MD

## 2014-12-31 ENCOUNTER — Encounter: Payer: Medicare Other | Admitting: Family Medicine

## 2015-01-22 ENCOUNTER — Other Ambulatory Visit: Payer: Self-pay | Admitting: Family Medicine

## 2015-01-28 NOTE — Telephone Encounter (Signed)
Refill request from pharmacy. Will forward to PCP for review. Mylisa Brunson, CMA. 

## 2015-01-28 NOTE — Telephone Encounter (Signed)
Daughter called to check the status of her mothers refill request. Lindsey [name]

## 2015-01-28 NOTE — Telephone Encounter (Signed)
Patient has not presented to clinic for over a year. Last seen 07/2013. I do not feel comfortable refilling any more Rxs until patient is seen. The medications she takes needs close monitoring. Please help schedule her an appointment or see if she has new PCP. Thanks

## 2015-02-23 ENCOUNTER — Other Ambulatory Visit: Payer: Self-pay | Admitting: Obstetrics and Gynecology

## 2015-02-25 NOTE — Telephone Encounter (Signed)
LM for patient to call back.  Please assist her in scheduling an appt with PCP. Jazmin Hartsell,CMA

## 2015-03-01 ENCOUNTER — Encounter: Payer: Self-pay | Admitting: Family Medicine

## 2015-03-01 ENCOUNTER — Ambulatory Visit (INDEPENDENT_AMBULATORY_CARE_PROVIDER_SITE_OTHER): Payer: Medicare Other | Admitting: Family Medicine

## 2015-03-01 VITALS — BP 117/61 | HR 51 | Temp 97.6°F | Wt 157.0 lb

## 2015-03-01 DIAGNOSIS — I2510 Atherosclerotic heart disease of native coronary artery without angina pectoris: Secondary | ICD-10-CM | POA: Diagnosis not present

## 2015-03-01 DIAGNOSIS — I10 Essential (primary) hypertension: Secondary | ICD-10-CM

## 2015-03-01 LAB — BASIC METABOLIC PANEL
BUN: 14 mg/dL (ref 7–25)
CHLORIDE: 102 mmol/L (ref 98–110)
CO2: 28 mmol/L (ref 20–31)
Calcium: 9.3 mg/dL (ref 8.6–10.4)
Creat: 1.21 mg/dL — ABNORMAL HIGH (ref 0.50–1.05)
Glucose, Bld: 77 mg/dL (ref 65–99)
Potassium: 3.7 mmol/L (ref 3.5–5.3)
SODIUM: 140 mmol/L (ref 135–146)

## 2015-03-01 MED ORDER — METOPROLOL SUCCINATE ER 50 MG PO TB24: ORAL_TABLET | ORAL | Status: DC

## 2015-03-01 MED ORDER — HYDROCHLOROTHIAZIDE 25 MG PO TABS: ORAL_TABLET | ORAL | Status: DC

## 2015-03-01 MED ORDER — ASPIRIN 81 MG PO TBEC: DELAYED_RELEASE_TABLET | ORAL | Status: DC

## 2015-03-01 NOTE — Progress Notes (Signed)
Patient ID: Lindsey [name], female   DOB: 07-26-1960, 54 y.o.   MRN: [id number]  HPI:  Pt presents for medication refills for her hypertension.   Has not been seen at our clinic in nearly 2 years. Currenty takes HCTZ  daily, aspirin  daily, and metoprolol XL  daily.  Not on any other medications. She is accompanied by her son who helps provide the history. Patient & son state she has not come to the doctor because she's been doing well. Denies chest pain, shortness of breath, lower extremity swelling, lightheadedness, headaches, or dizziness. Previously was on multiple other medications per med list (albuterol, iron, lipitor, plavix, gabapentin, zoloft, trazodone) but patient reports no longer taking any of these. Son checks BP at home and her BP's have been reportedly good, around the same value we got today in clinic.  ROS: See HPI.  PMFSH: hx HLD, obesity, HTN, TIA/CVA, arnold-chiari malformation, vertigo, sleep apnea, contrast dye allergy  PHYSICAL EXAM: BP 117/61 mmHg  Pulse 51  Temp(Src) 97.6 F (36.4 C) (Oral)  Wt 157 lb (71.215 kg) Gen: no acute distress. Pleasant and cooperative. Slightly disheveled, smells strongly of urine. HEENT: NCAT Heart: RRR no murmur Lungs: CTAB, NWOB Neuro: grossly nonfocal, speech normal Ext: No appreciable lower extremity edema bilaterally   ASSESSMENT/PLAN:  Health maintenance:  -advised pt to schedule complete physical to meet PCP & have lipids drawn  Essential hypertension, benign Well controlled.  -Refill metoprolol, HCTZ, and aspirin. -check BMET today -discussed need for more regular f/u with pt and son. Explained usual time interval for f/u is every 3 months -return to meet PCP & have full physical.  -Noted hx of stroke, no longer on statin for unknown reason. Recommend lipids at that appointment. Unable to draw today as patient nonfasting.   FOLLOW UP: F/u in the next month with PCP for complete physical  [place] J.  Pollie Meyer, MD [facility]

## 2015-03-01 NOTE — Patient Instructions (Signed)
Nice to see you today Refilled aspirin, HCTZ, and metoprolol Checking kidney function today  Please schedule a visit with Dr. Doroteo Glassman for a complete physical. You will need to have your cholesterol checked at some point.  Be well, Dr. Pollie Meyer

## 2015-03-02 NOTE — Assessment & Plan Note (Signed)
Well controlled.  -Refill metoprolol, HCTZ, and aspirin. -check BMET today -discussed need for more regular f/u with pt and son. Explained usual time interval for f/u is every 3 months -return to meet PCP & have full physical.  -Noted hx of stroke, no longer on statin for unknown reason. Recommend lipids at that appointment. Unable to draw today as patient nonfasting.

## 2015-03-06 ENCOUNTER — Encounter: Payer: Self-pay | Admitting: Family Medicine

## 2015-05-26 ENCOUNTER — Other Ambulatory Visit: Payer: Self-pay | Admitting: Family Medicine

## 2015-05-28 ENCOUNTER — Other Ambulatory Visit: Payer: Self-pay | Admitting: Family Medicine

## 2015-07-16 ENCOUNTER — Encounter (HOSPITAL_COMMUNITY): Payer: Self-pay | Admitting: Emergency Medicine

## 2015-07-16 ENCOUNTER — Emergency Department (HOSPITAL_COMMUNITY)
Admission: EM | Admit: 2015-07-16 | Discharge: 2015-08-07 | Disposition: E | Payer: Medicare Other | Attending: Emergency Medicine | Admitting: Emergency Medicine

## 2015-07-16 ENCOUNTER — Emergency Department (HOSPITAL_COMMUNITY): Payer: Medicare Other

## 2015-07-16 DIAGNOSIS — N189 Chronic kidney disease, unspecified: Secondary | ICD-10-CM | POA: Insufficient documentation

## 2015-07-16 DIAGNOSIS — Z8639 Personal history of other endocrine, nutritional and metabolic disease: Secondary | ICD-10-CM | POA: Insufficient documentation

## 2015-07-16 DIAGNOSIS — Z79899 Other long term (current) drug therapy: Secondary | ICD-10-CM | POA: Diagnosis not present

## 2015-07-16 DIAGNOSIS — Z8673 Personal history of transient ischemic attack (TIA), and cerebral infarction without residual deficits: Secondary | ICD-10-CM | POA: Diagnosis not present

## 2015-07-16 DIAGNOSIS — R0602 Shortness of breath: Secondary | ICD-10-CM

## 2015-07-16 DIAGNOSIS — I469 Cardiac arrest, cause unspecified: Secondary | ICD-10-CM | POA: Diagnosis not present

## 2015-07-16 DIAGNOSIS — Z7982 Long term (current) use of aspirin: Secondary | ICD-10-CM | POA: Insufficient documentation

## 2015-07-16 DIAGNOSIS — Z8719 Personal history of other diseases of the digestive system: Secondary | ICD-10-CM | POA: Insufficient documentation

## 2015-07-16 DIAGNOSIS — G4730 Sleep apnea, unspecified: Secondary | ICD-10-CM | POA: Insufficient documentation

## 2015-07-16 DIAGNOSIS — I129 Hypertensive chronic kidney disease with stage 1 through stage 4 chronic kidney disease, or unspecified chronic kidney disease: Secondary | ICD-10-CM | POA: Insufficient documentation

## 2015-07-16 DIAGNOSIS — Z9981 Dependence on supplemental oxygen: Secondary | ICD-10-CM | POA: Diagnosis not present

## 2015-07-16 DIAGNOSIS — I249 Acute ischemic heart disease, unspecified: Secondary | ICD-10-CM | POA: Diagnosis not present

## 2015-07-16 LAB — CBC WITH DIFFERENTIAL/PLATELET
BASOS ABS: 0 10*3/uL (ref 0.0–0.1)
BASOS PCT: 0 %
EOS ABS: 0 10*3/uL (ref 0.0–0.7)
EOS PCT: 0 %
HCT: 45.3 % (ref 36.0–46.0)
Hemoglobin: 16 g/dL — ABNORMAL HIGH (ref 12.0–15.0)
LYMPHS PCT: 27 %
Lymphs Abs: 2.3 10*3/uL (ref 0.7–4.0)
MCH: 29.7 pg (ref 26.0–34.0)
MCHC: 35.3 g/dL (ref 30.0–36.0)
MCV: 84.2 fL (ref 78.0–100.0)
MONO ABS: 0.3 10*3/uL (ref 0.1–1.0)
Monocytes Relative: 3 %
Neutro Abs: 6 10*3/uL (ref 1.7–7.7)
Neutrophils Relative %: 70 %
PLATELETS: 142 10*3/uL — AB (ref 150–400)
RBC: 5.38 MIL/uL — AB (ref 3.87–5.11)
RDW: 13.5 % (ref 11.5–15.5)
WBC: 8.6 10*3/uL (ref 4.0–10.5)

## 2015-07-16 LAB — I-STAT CHEM 8, ED
BUN: 25 mg/dL — AB (ref 6–20)
CALCIUM ION: 1.23 mmol/L (ref 1.12–1.23)
CHLORIDE: 98 mmol/L — AB (ref 101–111)
CREATININE: 0.8 mg/dL (ref 0.44–1.00)
Glucose, Bld: 164 mg/dL — ABNORMAL HIGH (ref 65–99)
HEMATOCRIT: 52 % — AB (ref 36.0–46.0)
Hemoglobin: 17.7 g/dL — ABNORMAL HIGH (ref 12.0–15.0)
Potassium: 3.6 mmol/L (ref 3.5–5.1)
SODIUM: 136 mmol/L (ref 135–145)
TCO2: 29 mmol/L (ref 0–100)

## 2015-07-16 LAB — COMPREHENSIVE METABOLIC PANEL
ALBUMIN: 3.8 g/dL (ref 3.5–5.0)
ALT: 61 U/L — AB (ref 14–54)
AST: 47 U/L — AB (ref 15–41)
Alkaline Phosphatase: 197 U/L — ABNORMAL HIGH (ref 38–126)
Anion gap: 15 (ref 5–15)
BUN: 19 mg/dL (ref 6–20)
CHLORIDE: 97 mmol/L — AB (ref 101–111)
CO2: 24 mmol/L (ref 22–32)
CREATININE: 0.92 mg/dL (ref 0.44–1.00)
Calcium: 10 mg/dL (ref 8.9–10.3)
GFR calc Af Amer: >60 mL/min (ref >60)
Glucose, Bld: 169 mg/dL — ABNORMAL HIGH (ref 65–99)
POTASSIUM: 3.7 mmol/L (ref 3.5–5.1)
SODIUM: 136 mmol/L (ref 135–145)
Total Bilirubin: 0.7 mg/dL (ref 0.3–1.2)
Total Protein: 8.4 g/dL — ABNORMAL HIGH (ref 6.5–8.1)

## 2015-07-16 LAB — I-STAT TROPONIN, ED: TROPONIN I, POC: 0.01 ng/mL (ref 0.00–0.08)

## 2015-07-16 LAB — I-STAT CG4 LACTIC ACID, ED: LACTIC ACID, VENOUS: 5.66 mmol/L — AB (ref 0.5–2.0)

## 2015-07-16 MED ORDER — SUCCINYLCHOLINE CHLORIDE 20 MG/ML IJ SOLN
INTRAMUSCULAR | Status: AC | PRN
  Administered 2015-07-16: 80 mg via INTRAVENOUS

## 2015-07-16 MED ORDER — NOREPINEPHRINE BITARTRATE 1 MG/ML IV SOLN
0.0000 ug/min | INTRAVENOUS | Status: DC
  Administered 2015-07-16: 4 ug/min via INTRAVENOUS
  Filled 2015-07-16: qty 4

## 2015-07-16 MED ORDER — SODIUM CHLORIDE 0.9 % IV SOLN
INTRAVENOUS | Status: AC | PRN
  Administered 2015-07-16 (×3): 1000 mL via INTRAVENOUS

## 2015-07-16 MED ORDER — ATROPINE SULFATE 1 MG/ML IJ SOLN
INTRAMUSCULAR | Status: AC | PRN
  Administered 2015-07-16: 1 mg via INTRAVENOUS

## 2015-07-16 MED ORDER — EPINEPHRINE HCL 0.1 MG/ML IJ SOSY
PREFILLED_SYRINGE | INTRAMUSCULAR | Status: AC | PRN
  Administered 2015-07-16 (×7): 1 mg via INTRAVENOUS

## 2015-07-16 MED ORDER — CALCIUM CHLORIDE 10 % IV SOLN
INTRAVENOUS | Status: AC | PRN
  Administered 2015-07-16: 100 meq via INTRAVENOUS

## 2015-07-16 MED ORDER — AMIODARONE HCL 150 MG/3ML IV SOLN
300.0000 mg | INTRAVENOUS | Status: AC | PRN
  Administered 2015-07-16: 300 mg via INTRAVENOUS
  Administered 2015-07-16: 150 mg via INTRAVENOUS

## 2015-07-16 MED ORDER — ETOMIDATE 2 MG/ML IV SOLN
INTRAVENOUS | Status: AC | PRN
Start: 2015-07-16 — End: 2015-07-16
  Administered 2015-07-16: 20 mg via INTRAVENOUS

## 2015-07-16 MED ORDER — SODIUM BICARBONATE 8.4 % IV SOLN
INTRAVENOUS | Status: AC | PRN
  Administered 2015-07-16: 50 meq via INTRAVENOUS

## 2015-07-17 ENCOUNTER — Ambulatory Visit: Payer: Medicare Other | Admitting: Obstetrics and Gynecology

## 2015-07-19 ENCOUNTER — Ambulatory Visit: Payer: Medicare Other | Admitting: Obstetrics and Gynecology

## 2015-07-23 ENCOUNTER — Telehealth: Payer: Self-pay | Admitting: Obstetrics and Gynecology

## 2015-07-23 NOTE — Telephone Encounter (Signed)
D/C for patient has been placed in Dr. Gretchen Portela box for completion. Please return to me. Thanks.

## 2015-07-24 NOTE — Telephone Encounter (Signed)
Complete. Put on your desk.

## 2015-08-01 ENCOUNTER — Other Ambulatory Visit: Payer: Self-pay | Admitting: Obstetrics and Gynecology

## 2015-08-07 NOTE — Progress Notes (Signed)
Responded to post CPR page to provide support to patient family ( sister Olegario MessierKathy). Patient sister was escorted to consultation room A and was informed by EDP of  patient status. Sister was invited to bedside to be with patient who was actively dying but said she needed to get some air and make some calls. Patient son was called and said he was in route to hospital once his ride was available.   Patient  reported to ED as Post CPR .  Family witnessed while  patient sitting on couch. Patient passed .   Sep 08, 2015 1100  Clinical Encounter Type  Visited With Patient;Family;Health care provider  Visit Type Initial;Spiritual support;Death;ED;Trauma  Referral From Nurse  Spiritual Encounters  Spiritual Needs Emotional;Grief support  Stress Factors  Family Stress Factors Loss  Lourdes Kucharski, [address]

## 2015-08-07 NOTE — ED Notes (Signed)
Family at bedside. 

## 2015-08-07 NOTE — Consult Note (Signed)
Name: Lindsey [name] MRN: [id number] DOB: [birth year]    ADMISSION DATE:  15-Aug-2015 CONSULTATION DATE:  1/10  REFERRING MD :  Fredderick Phenix   CHIEF COMPLAINT:  Cardiac arrest   BRIEF PATIENT DESCRIPTION:  55 year old female w/ prior stroke, w/c bound at baseline w/ HTN, and HL. She was in usual state of health until she was witnessed to have seizure like activity; then collapsed. On fire arrival she was in PEA arrest. Had total of 13 minutes CPR by EMS. On arrival was unable to hold spontaneous circulation and continued to go in and out of PEA and VF. In spite of these efforts we were unable to maintain spontaneous circulation.    SIGNIFICANT EVENTS  Spoke to son Lindsey [name]:   STUDIES:  CT head 1. Progression of chronic ischemic changes since prior study of 2014. There is an interval infarct in the left caudate body. Frontal lobe encephalomalacia has progressed. 2. No evidence of acute infarct or hemorrhage.   HISTORY OF PRESENT ILLNESS:  See above   PAST MEDICAL HISTORY :   has a past medical history of Hypertension; Neuropathy; Chiari I malformation; Hyperlipidemia; CVA (cerebral infarction); Migraine; Daily headache; GERD (gastroesophageal reflux disease); Chronic kidney disease; Stroke (2008;  ?2009; ); and Sleep apnea.  has past surgical history that includes Reduction mammaplasty (~ 2002); Cesarean section (4098; 1983; 1989); Craniectomy suboccipital w/ cervical laminectomy / Chiari (2000's); and Brain surgery. Prior to Admission medications   Medication Sig Start Date End Date Taking? Authorizing Provider  aspirin 81 MG EC tablet TAKE 1 TABLET (81 MG TOTAL) BY MOUTH DAILY. 05/29/15   Pincus Large, DO  hydrochlorothiazide (HYDRODIURIL) 25 MG tablet TAKE 1 TABLET BY MOUTH DAILY (DUE FOR PRIMARY DOCTOR APPOINTMENT) 05/29/15   Pincus Large, DO  metoprolol succinate (TOPROL-XL) 50 MG 24 hr tablet Take 1 tablet (50 mg total) by mouth daily. 05/27/15   Pincus Large, DO    Allergies  Allergen Reactions  . Iodine Swelling    Tongue swells up per pt.    FAMILY HISTORY:  family history includes Heart disease in her brother, mother, and sister; Hyperlipidemia in her mother; Hypertension in her mother. SOCIAL HISTORY:  reports that she has never smoked. She has never used smokeless tobacco. She reports that she does not drink alcohol or use illicit drugs.  REVIEW OF SYSTEMS:   Unable   SUBJECTIVE:  Critically ill  VITAL SIGNS: Temp:  [90 F (32.2 C)] 90 F (32.2 C) (01/10 1000) Pulse Rate:  [53-124] 124 (01/10 1100) Resp:  [12-73] 73 (01/10 1100) BP: (157-220)/(140-147) 220/140 mmHg (01/10 1100) SpO2:  [100 %] 100 % (01/10 1100)   Recent Labs Lab 2015-08-15 1000 08-15-2015 1035  NA 136 136  K 3.7 3.6  CL 97* 98*  CO2 24  --   BUN 19 25*  CREATININE 0.92 0.80  GLUCOSE 169* 164*    Recent Labs Lab 08/15/2015 1000 08/15/15 1035  HGB 16.0* 17.7*  HCT 45.3 52.0*  WBC 8.6  --   PLT 142*  --    Ct Head Wo Contrast  August 15, 2015  CLINICAL DATA:  Post CPR. Multiple cardiac arrests. History of hypertension, multiple strokes, migraine headaches and surgery for Chiari malformation. EXAM: CT HEAD WITHOUT CONTRAST TECHNIQUE: Contiguous axial images were obtained from the base of the skull through the vertex without intravenous contrast. COMPARISON:  Head CT 09/09/2012 and 08/28/2012. FINDINGS: There is no evidence of acute intracranial hemorrhage, mass lesion, brain edema  or extra-axial fluid collection. There are stable postsurgical changes status post suboccipital craniotomy for Chiari decompression. There is stable encephalomalacia in the left inferior cerebellum. There is extensive encephalomalacia in the frontal lobes bilaterally which appears mildly progressive. Chronic infarcts involving the anterior basal ganglia and right thalamus are again noted. There is a new infarct involving the left caudate body (image 17). The ventricle size is stable. No  evidence of acute cortical based infarct. There is new mucosal thickening in the ethmoid sinuses bilaterally. The visualized paranasal sinuses, mastoid air cells and middle ears are otherwise clear. The calvarium is otherwise intact. IMPRESSION: 1. Progression of chronic ischemic changes since prior study of 2014. There is an interval infarct in the left caudate body. Frontal lobe encephalomalacia has progressed. 2. No evidence of acute infarct or hemorrhage. Electronically Signed   By: Carey BullocksWilliam  Veazey M.D.   On: 2016/02/15 11:09   Dg Chest Port 1 View  Oct 10, 2015  CLINICAL DATA:  Shortness of breath.  CPR. EXAM: PORTABLE CHEST 1 VIEW COMPARISON:  07/03/2013. FINDINGS: Endotracheal tube tip noted in the orifice of the right mainstem bronchus. Retraction of 2-3 cm suggested. Low lung volumes with basilar atelectasis. Cardiomegaly with normal pulmonary vascularity. No pleural effusion or pneumothorax. Mild gastric distention. Coarse calcifications right breast . IMPRESSION: 1. Endotracheal tube tip noted in the orifice of the right mainstem bronchus. Retraction of 2-3 cm suggested. 2.  Low lung volumes with bibasilar atelectasis. 3.  Stable cardiomegaly. 4.  Mild gastric distention. Critical Value/emergent results were called by telephone at the time of interpretation on Oct 10, 2015 at 10:30 am to nurse Chelsie, who verbally acknowledged these results. a Electronically Signed   By: Maisie Fushomas  Register   On: 2016/02/15 10:32    ASSESSMENT / PLAN:  Cardiac arrest: suspect primary cardiac  Recurrent VF Cardiogenic shock  Prolonged CPR H/o CVA w/ residual aphasia and weakness Possible seizure  CKD stage III  This is a 55 year old female w/ prior stroke, w/c bound at baseline w/ HTN, and HL. She was in usual state of health until she was witnessed to have seizure like activity; then collapsed. On fire arrival she was in PEA arrest. Had total of 13 minutes CPR by EMS. On arrival was unable to hold spontaneous  circulation and continued to go in and out of PEA and VF. In spite of these efforts we were unable to maintain spontaneous circulation. Code called as resuscitation efforts exceeded 30 minutes and unsuccessful  Time of death Per EDP note.  Family updated.   Simonne MartinetPeter E Babcock ACNP-BC [facility] Pager # [phone] OR # [phone] if no answer  Pulmonary and Critical Care Medicine [facility] Pager: [phone] Oct 10, 2015, 11:23 AM  Attending Note:  I have examined patient, reviewed labs, studies and notes. I have discussed the case with Kreg ShropshireP Babcock, and I agree with the data and plans as amended above.   Levy Pupaobert Vanesha Athens, MD, PhD Oct 10, 2015, 5:38 PM [place] [facility] [phone] or if no answer [phone]

## 2015-08-07 NOTE — ED Notes (Signed)
Death certificate sent to internal medicine clinic per dr. Margarita Mailbelifi's request.

## 2015-08-07 NOTE — Code Documentation (Signed)
Patient time of death occurred at [id number].

## 2015-08-07 NOTE — ED Notes (Signed)
Patient asystole

## 2015-08-07 NOTE — ED Notes (Signed)
Chaplain advised patient's brother and sister at bedside, patient's son still has not arrived. Pts sister Lindsey [name]([name]) at bedside gave her information to be contact person for patient.

## 2015-08-07 NOTE — ED Notes (Signed)
Bed control contacted, Lindsey [name] in bed control advised everything in post mortem checklist looks complete at this time.

## 2015-08-07 NOTE — Progress Notes (Signed)
Transported pt on vent to CT with no episodes, once back to pt room pt was continuously coding, family decided no further measures were to be taken. RT extubated pt per MD verbal order.

## 2015-08-07 NOTE — ED Notes (Signed)
EDT or RN unable to obtain manual BP at this time.

## 2015-08-07 NOTE — Code Documentation (Signed)
Organ procurement team notified.

## 2015-08-07 NOTE — ED Notes (Signed)
Pt. BIB EMS as post cpr. EMS reports pt. Was at home sitting on couch. Family witnessed seizure like activity including foaming at the mouth. Family moved pt. To floor, EMS found pt. On floor in PEA with agonal respirations. EMS began CPR x 13 min, one EPI given. Post ROSC EMS gave 5mg  versed for seizure like activity. Pt. NSR on arrival.

## 2015-08-07 NOTE — ED Notes (Signed)
Unable to obtain manual BP at this time

## 2015-08-07 NOTE — ED Provider Notes (Addendum)
CSN: [id number]     Arrival date & time 07/19/2015  1009 History   First MD Initiated Contact with Patient 07/19/2015 1021     No chief complaint on file.    (Consider location/radiation/quality/duration/timing/severity/associated sxs/prior Treatment) HPI Comments: Patient history of hypertension hyperlipidemia, past CVA and Chiari I malformation presents postcardiac arrest. Per EMS, patient was witnessed by family to have some brief seizure-like activity while she was sitting on the sofa. She then collapsed. She had foaming at the mouth. When fire department arrived, she was in PEA arrest. CPR was started. She was given 1 epinephrine prior to arrival. She had a total of 13 minutes of CPR prior to arrival. She was not intubated on arrival. Other history is not able to be obtained due to patient's unresponsive nature.   Past Medical History  Diagnosis Date  . Hypertension   . Neuropathy   . Chiari I malformation   . Hyperlipidemia   . CVA (cerebral infarction)   . Migraine   . Daily headache     "lately" (05/18/12)  . GERD (gastroesophageal reflux disease)   . Chronic kidney disease     not on dialysis  . Stroke 2008;  ?2009;     7 strokes, L sided deficits  . Sleep apnea     USES CPAP   Past Surgical History  Procedure Laterality Date  . Reduction mammaplasty  ~ 2002  . Cesarean section  1478; 1983; 1989  . Craniectomy suboccipital w/ cervical laminectomy / chiari  2000's  . Brain surgery     Family History  Problem Relation Age of Onset  . Hyperlipidemia Mother   . Heart disease Mother   . Hypertension Mother   . Heart disease Sister   . Heart disease Brother    Social History  Substance Use Topics  . Smoking status: Never Smoker   . Smokeless tobacco: Never Used  . Alcohol Use: No   OB History    No data available     Review of Systems  Unable to perform ROS: Mental status change      Allergies  Iodine  Home Medications   Prior to Admission medications    Medication Sig Start Date End Date Taking? Authorizing Provider  aspirin 81 MG EC tablet TAKE 1 TABLET (81 MG TOTAL) BY MOUTH DAILY. 05/29/15   Pincus Large, DO  hydrochlorothiazide (HYDRODIURIL) 25 MG tablet TAKE 1 TABLET BY MOUTH DAILY (DUE FOR PRIMARY DOCTOR APPOINTMENT) 05/29/15   Pincus Large, DO  metoprolol succinate (TOPROL-XL) 50 MG 24 hr tablet Take 1 tablet (50 mg total) by mouth daily. 05/27/15   Pincus Large, DO   There were no vitals taken for this visit. Physical Exam  Constitutional: She appears well-developed and well-nourished. She appears distressed.  HENT:  Head: Normocephalic and atraumatic.  Eyes: Pupils are equal, round, and reactive to light.  Pupils are 3 mm bilaterally  Neck: Normal range of motion. Neck supple.  Cardiovascular: Normal rate, regular rhythm and normal heart sounds.   Pulmonary/Chest: Breath sounds normal. She is in respiratory distress. She has no wheezes. She has no rales. She exhibits no tenderness.  Patient does have spontaneous respirations at a rate of about 8/m she has foamy secretions in the oropharynx, nasopharyngeal airways are in place, she is being ventilated with a bad valve mask to assist ventilations  Abdominal: Soft. Bowel sounds are normal. There is no tenderness. There is no rebound and no guarding.  Musculoskeletal: Normal range  of motion. She exhibits no edema.  Lymphadenopathy:    She has no cervical adenopathy.  Neurological:  Patient with spontaneous, nonpurposeful movements, is unresponsive to stimuli  Skin: Skin is warm and dry. No rash noted.  Psychiatric: She has a normal mood and affect.    ED Course  Procedures (including critical care time) Labs Review Labs Reviewed  CBC WITH DIFFERENTIAL/PLATELET - Abnormal; Notable for the following:    RBC 5.38 (*)    Hemoglobin 16.0 (*)    Platelets 142 (*)    All other components within normal limits  I-STAT CG4 LACTIC ACID, ED - Abnormal; Notable for the following:     Lactic Acid, Venous 5.66 (*)    All other components within normal limits  I-STAT CHEM 8, ED - Abnormal; Notable for the following:    Chloride 98 (*)    BUN 25 (*)    Glucose, Bld 164 (*)    Hemoglobin 17.7 (*)    HCT 52.0 (*)    All other components within normal limits  CULTURE, BLOOD (ROUTINE X 2)  CULTURE, BLOOD (ROUTINE X 2)  URINE CULTURE  COMPREHENSIVE METABOLIC PANEL  URINALYSIS, ROUTINE W REFLEX MICROSCOPIC (NOT AT [facility])  Rosezena Sensor, ED    Imaging Review Dg Chest Port 1 View  August 12, 2015  CLINICAL DATA:  Shortness of breath.  CPR. EXAM: PORTABLE CHEST 1 VIEW COMPARISON:  07/03/2013. FINDINGS: Endotracheal tube tip noted in the orifice of the right mainstem bronchus. Retraction of 2-3 cm suggested. Low lung volumes with basilar atelectasis. Cardiomegaly with normal pulmonary vascularity. No pleural effusion or pneumothorax. Mild gastric distention. Coarse calcifications right breast . IMPRESSION: 1. Endotracheal tube tip noted in the orifice of the right mainstem bronchus. Retraction of 2-3 cm suggested. 2.  Low lung volumes with bibasilar atelectasis. 3.  Stable cardiomegaly. 4.  Mild gastric distention. Critical Value/emergent results were called by telephone at the time of interpretation on Aug 12, 2015 at 10:30 am to nurse Chelsie, who verbally acknowledged these results. a Electronically Signed   By: Maisie Fus  Register   On: 2015/08/12 10:32   I have personally reviewed and evaluated these images and lab results as part of my medical decision-making.   EKG Interpretation   Date/Time:  Tuesday 12-Aug-2015 10:15:13 EST Ventricular Rate:  86 PR Interval:  218 QRS Duration: 171 QT Interval:  532 QTC Calculation: 636 R Axis:   52 Text Interpretation:  Age not entered, assumed to be  55 years old for  purpose of ECG interpretation Sinus rhythm Prolonged PR interval LAE,  consider biatrial enlargement Left bundle branch block changed from prior  EKG Confirmed by  Square Jowett  MD, Tamma Brigandi ([id number]) on 2015/08/12 10:34:34 AM      MDM   Final diagnoses:  SOB (shortness of breath)    Patient presents after an episode of prehospital cardiac arrest. On arrival, she was being ventilated with a bag valve mask and had a pulse with a heart rate in the 50s and 60s. She was intubated in the ED by the paramedic in care of the patient under my direct supervision.  Pt had no episodes of hypoxia during intubation. A few minutes following intubation, patient started to have a wide-complex junctional type rhythm. She then went into cardiac arrest. CPR was immediately started. She was given epinephrine. She did obtain Rosc after 1-2 minutes. Subsequently she had 2 further episodes of intermittent cardiac arrest. She was in a PEA type rhythm each time. ACLS protocols were followed.  She was started on levophed after she obtained Rosc. She was also given sodium bicarbonate as well as calcium in the event that her potassium was elevated, given the wide complex rhythm. However her labs did come back within normal potassium.  We were able to transport her to CT. However she re-arrested on transport back from the CT scanner. She had several episodes of cardiac arrest following this. She did at times going into a V. fib arrest and was defibrillated. I consulted cardiology however she was not able to obtain Rosc long enough for potential cath consideration. I also consulted CCM once we obtained Rosc. The nurse practitioner with CCM evaluated the patient however she then rearrested. He did discuss the findings with the family. The decision was made to terminate resuscitation efforts after over an hour of intermittent episodes of cardiac arrest. She was found to have an unobtainable rectal temperature. A temporal temperature read 77.5. Asystole was confirmed on the patient. I contacted the family medicine resident, Dr. Jonathon JordanGambino who advised that the death certificate can be signed by the family medicine  physicians.  Cardiopulmonary Resuscitation (CPR) Procedure Note Directed/Performed by: Rolan BuccoBELFI, Tagg Eustice I personally directed ancillary staff and/or performed CPR in an effort to regain return of spontaneous circulation and to maintain cardiac, neuro and systemic perfusion.    INTUBATION Performed by: Shamila Lerch  Required items: required blood products, implants, devices, and special equipment available Patient identity confirmed: provided demographic data and hospital-assigned identification number Time out: Immediately prior to procedure a "time out" was called to verify the correct patient, procedure, equipment, support staff and site/side marked as required.  Indications: respiratory failure   Intubation method: direct Laryngoscopy   Preoxygenation: BVM  Sedatives: Etomidate Paralytic: Succinylcholine  Tube Size: 7.5 cuffed  Post-procedure assessment: chest rise and ETCO2 monitor Breath sounds: equal and absent over the epigastrium Tube secured with: ETT holder Chest x-ray interpreted by radiologist and me.  Chest x-ray findings: endotracheal tube entering right mainstem.  Tube pulled back 2cm  Patient tolerated the procedure well with no immediate complications.    CRITICAL CARE Performed by: Maleik Vanderzee Total critical care time: 45 minutes Critical care time was exclusive of separately billable procedures and treating other patients. Critical care was necessary to treat or prevent imminent or life-threatening deterioration. Critical care was time spent personally by me on the following activities: development of treatment plan with patient and/or surrogate as well as nursing, discussions with consultants, evaluation of patient's response to treatment, examination of patient, obtaining history from patient or surrogate, ordering and performing treatments and interventions, ordering and review of laboratory studies, ordering and review of radiographic studies, pulse  oximetry and re-evaluation of patient's condition.    Rolan BuccoMelanie Niylah Hassan, MD Apr 04, 2016 [id number]  Rolan BuccoMelanie Glenetta Kiger, MD Apr 04, 2016 [id number]

## 2015-08-07 DEATH — deceased
